# Patient Record
Sex: Female | Born: 1945
Health system: Southern US, Community
[De-identification: ages and names within clinical notes are randomized; demographics above are authoritative.]

## PROBLEM LIST (undated history)

## (undated) DIAGNOSIS — Z9119 Patient's noncompliance with other medical treatment and regimen: Secondary | ICD-10-CM

## (undated) DIAGNOSIS — E042 Nontoxic multinodular goiter: Secondary | ICD-10-CM

## (undated) DIAGNOSIS — I10 Essential (primary) hypertension: Secondary | ICD-10-CM

## (undated) DIAGNOSIS — K219 Gastro-esophageal reflux disease without esophagitis: Secondary | ICD-10-CM

## (undated) DIAGNOSIS — M1990 Unspecified osteoarthritis, unspecified site: Secondary | ICD-10-CM

## (undated) DIAGNOSIS — Z9289 Personal history of other medical treatment: Secondary | ICD-10-CM

## (undated) DIAGNOSIS — I2510 Atherosclerotic heart disease of native coronary artery without angina pectoris: Secondary | ICD-10-CM

## (undated) DIAGNOSIS — K449 Diaphragmatic hernia without obstruction or gangrene: Secondary | ICD-10-CM

## (undated) DIAGNOSIS — Z91199 Patient's noncompliance with other medical treatment and regimen due to unspecified reason: Secondary | ICD-10-CM

## (undated) DIAGNOSIS — I493 Ventricular premature depolarization: Secondary | ICD-10-CM

## (undated) DIAGNOSIS — R911 Solitary pulmonary nodule: Secondary | ICD-10-CM

## (undated) DIAGNOSIS — I6529 Occlusion and stenosis of unspecified carotid artery: Secondary | ICD-10-CM

## (undated) DIAGNOSIS — B1910 Unspecified viral hepatitis B without hepatic coma: Secondary | ICD-10-CM

## (undated) DIAGNOSIS — E785 Hyperlipidemia, unspecified: Secondary | ICD-10-CM

## (undated) DIAGNOSIS — I340 Nonrheumatic mitral (valve) insufficiency: Secondary | ICD-10-CM

## (undated) HISTORY — DX: Patient's noncompliance with other medical treatment and regimen: Z91.19

## (undated) HISTORY — DX: Occlusion and stenosis of unspecified carotid artery: I65.29

## (undated) HISTORY — PX: LIPOMA EXCISION: SHX5283

## (undated) HISTORY — DX: Ventricular premature depolarization: I49.3

## (undated) HISTORY — DX: Diaphragmatic hernia without obstruction or gangrene: K44.9

## (undated) HISTORY — PX: CORONARY ANGIOPLASTY WITH STENT PLACEMENT: SHX49

## (undated) HISTORY — DX: Solitary pulmonary nodule: R91.1

## (undated) HISTORY — PX: BIOPSY THYROID: PRO38

## (undated) HISTORY — DX: Unspecified osteoarthritis, unspecified site: M19.90

## (undated) HISTORY — DX: Gastro-esophageal reflux disease without esophagitis: K21.9

## (undated) HISTORY — DX: Nontoxic multinodular goiter: E04.2

## (undated) HISTORY — DX: Nonrheumatic mitral (valve) insufficiency: I34.0

## (undated) HISTORY — DX: Unspecified viral hepatitis B without hepatic coma: B19.10

## (undated) HISTORY — DX: Patient's noncompliance with other medical treatment and regimen due to unspecified reason: Z91.199

## (undated) HISTORY — DX: Hyperlipidemia, unspecified: E78.5

## (undated) HISTORY — DX: Personal history of other medical treatment: Z92.89

## (undated) HISTORY — PX: TONSILLECTOMY: SUR1361

## (undated) HISTORY — DX: Essential (primary) hypertension: I10

## (undated) HISTORY — DX: Atherosclerotic heart disease of native coronary artery without angina pectoris: I25.10

---

## 1997-06-13 HISTORY — PX: EXCISIONAL HEMORRHOIDECTOMY: SHX1541

## 1999-03-10 ENCOUNTER — Ambulatory Visit (HOSPITAL_COMMUNITY): Admission: RE | Admit: 1999-03-10 | Discharge: 1999-03-10 | Payer: Self-pay | Admitting: Gastroenterology

## 2003-08-15 ENCOUNTER — Encounter (INDEPENDENT_AMBULATORY_CARE_PROVIDER_SITE_OTHER): Payer: Self-pay | Admitting: Specialist

## 2003-08-15 ENCOUNTER — Ambulatory Visit (HOSPITAL_COMMUNITY): Admission: RE | Admit: 2003-08-15 | Discharge: 2003-08-15 | Payer: Self-pay | Admitting: Otolaryngology

## 2003-08-15 ENCOUNTER — Ambulatory Visit (HOSPITAL_BASED_OUTPATIENT_CLINIC_OR_DEPARTMENT_OTHER): Admission: RE | Admit: 2003-08-15 | Discharge: 2003-08-15 | Payer: Self-pay | Admitting: Otolaryngology

## 2007-05-13 ENCOUNTER — Emergency Department (HOSPITAL_COMMUNITY): Admission: EM | Admit: 2007-05-13 | Discharge: 2007-05-14 | Payer: Self-pay | Admitting: Emergency Medicine

## 2007-12-03 ENCOUNTER — Ambulatory Visit (HOSPITAL_COMMUNITY): Admission: RE | Admit: 2007-12-03 | Discharge: 2007-12-03 | Payer: Self-pay | Admitting: Gastroenterology

## 2007-12-08 ENCOUNTER — Encounter (INDEPENDENT_AMBULATORY_CARE_PROVIDER_SITE_OTHER): Payer: Self-pay | Admitting: *Deleted

## 2009-08-20 ENCOUNTER — Encounter (INDEPENDENT_AMBULATORY_CARE_PROVIDER_SITE_OTHER): Payer: Self-pay | Admitting: *Deleted

## 2009-08-24 ENCOUNTER — Ambulatory Visit: Payer: Self-pay | Admitting: Gastroenterology

## 2009-08-24 DIAGNOSIS — R198 Other specified symptoms and signs involving the digestive system and abdomen: Secondary | ICD-10-CM | POA: Insufficient documentation

## 2010-05-20 ENCOUNTER — Emergency Department (HOSPITAL_COMMUNITY): Admission: EM | Admit: 2010-05-20 | Discharge: 2009-09-21 | Payer: Self-pay | Admitting: Emergency Medicine

## 2010-07-15 NOTE — Letter (Signed)
Summary: New Patient letter  [facility]  [address] [place], [place] [address]   Phone: [phone]  Fax: [phone]       08/20/2009 MRN: [id number]  [facility] [address] [place], [place]  [address]  Dear Ms. [facility],  Welcome to the Gastroenterology Division at [facility].    You are scheduled to see Dr.  Claudette Head on August 24, 2009 at 9:30am on the 3rd floor at [facility], [address]  We ask that you try to arrive at our office 15 minutes prior to your appointment time to allow for check-in.  We would like you to complete the enclosed self-administered evaluation form prior to your visit and bring it with you on the day of your appointment.  We will review it with you.  Also, please bring a complete list of all your medications or, if you prefer, bring the medication bottles and we will list them.  Please bring your insurance card so that we may make a copy of it.  If your insurance requires a referral to see a specialist, please bring your referral form from your primary care physician.  Co-payments are due at the time of your visit and may be paid by cash, check or credit card.     Your office visit will consist of a consult with your physician (includes a physical exam), any laboratory testing he/she may order, scheduling of any necessary diagnostic testing (e.g. x-ray, ultrasound, CT-scan), and scheduling of a procedure (e.g. Endoscopy, Colonoscopy) if required.  Please allow enough time on your schedule to allow for any/all of these possibilities.    If you cannot keep your appointment, please call [phone] to cancel or reschedule prior to your appointment date.  This allows [place] the opportunity to schedule an appointment for another patient in need of care.  If you do not cancel or reschedule by 5 p.m. the business day prior to your appointment date, you will be charged a $50.00 late cancellation/no-show fee.    Thank you  for choosing [facility] for your medical needs.  We appreciate the opportunity to care for you.  Please visit [place] at our website  to learn more about our practice.                     Sincerely,                                                             The Gastroenterology Division

## 2010-07-15 NOTE — Op Note (Signed)
Summary: Esophageal 24-hour pH study  NAME:  [name], [name]           ACCOUNT NO.:  [id number]      MEDICAL RECORD NO.:  [id number]          PATIENT TYPE:  AMB      LOCATION:  ENDO                         FACILITY:  [facility]      PHYSICIAN:  John C. Madilyn Fireman, M.D.    DATE OF BIRTH:  1946-05-09      DATE OF PROCEDURE:   DATE OF DISCHARGE:  12/03/2007                                  OPERATIVE REPORT      OPERATION:  Esophageal 24-hour pH study.      INDICATIONS FOR PROCEDURE:  Reflux like dyspepsia.  This diagnosis not   strongly supported by endoscopic findings or response to antibiotic   medication.  Procedure is for better diagnostic accuracy and then   consideration of possible properness of antireflux surgery which she is   requesting.      RESULTS:  Total DeMeester score was 1.9, which was well below upper   limits of normal.  Symptom correlation was poor with 12 episodes of   reported belching, 1 episode of heartburn, and 1 episode cough with a 0%   correlation to documented reflux episodes.      IMPRESSION:  Normal reflux scores with assent symptom correlation.  This   study does not suggest reflux as a cause or gastroesophageal reflux as a   cause for his symptoms.      PLAN:  We will begin one more trial of double dose proton pump inhibitor   to see if her symptoms improve significantly before completely ruling   out real possible antireflux surgery.  She is instructed to follow-up   with me in 6 weeks.                  ______________________________   Everardo All Madilyn Fireman, M.D.            JCH/MEDQ  D:  12/08/2007  T:  12/09/2007  Job:  [id number]

## 2010-07-15 NOTE — Op Note (Signed)
Summary: Esophageal Motility Study  [name], PANIK           ACCOUNT NO.:  [id number]      MEDICAL RECORD NO.:  [id number]          PATIENT TYPE:  AMB      LOCATION:  ENDO                         FACILITY:  [facility]      PHYSICIAN:  John C. Madilyn Fireman, M.D.    DATE OF BIRTH:  1945-06-26      DATE OF PROCEDURE:   DATE OF DISCHARGE:  12/03/2007                                  OPERATIVE REPORT      ESOPHAGEAL MOTILITY STUDY      INDICATION FOR PROCEDURE:  Reflux like symptoms, not clearly confirmed   by endoscopy or response to medication.  Procedure is done in   conjunction with 24-hour pH study and in consideration of possible   appropriateness of antireflux surgery.      RESULTS:   1. Upper esophageal sphincter:  Normal.   2. Lower esophageal sphincter study:  Normal LES pressure at 43.5 and       normal relaxation at 86%.   3. Esophageal body study:  Ten out of eleven contractions peristaltic       with normal amplitude, duration, and velocity.      IMPRESSION:  Normal esophageal motility.                  ______________________________   Everardo All. Madilyn Fireman, M.D.            JCH/MEDQ  D:  12/08/2007  T:  12/08/2007  Job:  [id number]

## 2010-07-15 NOTE — Assessment & Plan Note (Signed)
Summary: frequent bowel movements, soreness..em   History of Present Illness Visit Type: Initial Visit Primary GI MD: Elie Goody MD [facility] Primary Provider: [facility] Chief Complaint: Patient compains of a change in bowels in the past week. She states that she is having 3 BM per day. She is having alot of rectal pain but denies any blood in her stool. She states that she has some upper abdominal pain and gas. Patient complains that she can not sleep and would like to know if Dr. Russella Dar can give her an Rx for Valium.  History of Present Illness:   This is a 65 year old female, who was previously evaluated by Dr. Dorena Cookey. By her report, she had problems with bad breath and belching and underwent evaluation including upper endoscopy, which apparently showed a small hiatal hernia. A pH study in 11/2007, which was negative and esophageal manometry in 11/2007 which was negative. She has had persistent problems with upper abdominal gas pains and belching. Her abdominal pain, generally improves with belching. She has noted a change in bowel habits over the past few months with more frequent stools occurring 2-3 times a day from her previous bowel pattern of one to 2 times a day. She occasionally has rectal pain with passage of a hard stool. She states she's been anxious. She had a flexible sigmoidoscopy in 1999 that was apparently unremarkable. In addition, she had an unknown type of rectal surgery performed by Dr. Johna Sheriff in 1999.    GI Review of Systems    Reports abdominal pain and  belching.     Location of  Abdominal pain: upper abdomen.    Denies acid reflux, bloating, chest pain, dysphagia with liquids, dysphagia with solids, heartburn, loss of appetite, nausea, vomiting, vomiting blood, weight loss, and  weight gain.      Reports change in bowel habits and  rectal pain.     Denies anal fissure, black tarry stools, constipation, diarrhea, diverticulosis, fecal incontinence, heme  positive stool, hemorrhoids, irritable bowel syndrome, jaundice, light color stool, liver problems, and  rectal bleeding. Preventive Screening-Counseling & Management  Alcohol-Tobacco     Smoking Status: current      Drug Use:  no.     Current Medications (verified): 1)  None  Allergies (verified): 1)  ! Ibuprofen (Ibuprofen)  Past History:  Past Medical History: GERD hiatal hernia  Past Surgical History: C section Tonsillectomy Rectal surgery ? hemorrhoidectomy, 1999  Family History: Family History of Diabetes: sister Family History of Prostate Cancer: father No FH of Colon Cancer:  Social History: Occupation: retired Patient currently smokes.  Alcohol Use - no Illicit Drug Use - no Smoking Status:  current Drug Use:  no  Review of Systems       The patient complains of back pain and sleeping problems.         The pertinent positives and negatives are noted as above and in the HPI. All other ROS were reviewed and were negative.   Vital Signs:  Patient profile:   65 year old female Height:      58 inches Weight:      113.4 pounds BMI:     23.79 Pulse rate:   84 / minute Pulse rhythm:   regular BP sitting:   160 / 82  (left arm) Cuff size:   regular  Vitals Entered By: Harlow Mares CMA Duncan Dull) (August 24, 2009 10:16 AM)  Physical Exam  General:  Well developed, well nourished, no acute distress.  Head:  Normocephalic and atraumatic. Eyes:  PERRLA, no icterus. Ears:  Normal auditory acuity. Mouth:  No deformity or lesions, dentition normal. Neck:  Supple; no masses or thyromegaly. Lungs:  Clear throughout to auscultation. Heart:  Regular rate and rhythm; no murmurs, rubs,  or bruits. Abdomen:  Soft, nontender and nondistended. No masses, hepatosplenomegaly or hernias noted. Normal bowel sounds. Rectal:  Normal exam. hemocult negative stool in vault.   Msk:  Symmetrical with no gross deformities. Normal posture. Pulses:  Normal pulses  noted. Extremities:  No clubbing, cyanosis, edema or deformities noted. Neurologic:  Alert and  oriented x4;  grossly normal neurologically. Cervical Nodes:  No significant cervical adenopathy. Inguinal Nodes:  No significant inguinal adenopathy. Psych:  Alert and cooperative. Normal mood and affect.  Impression & Recommendations:  Problem # 1:  CHANGE IN BOWELS (ICD-787.99) Mild change in bowel habits. Average risk for colorectal cancer. The risks, benefits and alternatives to colonoscopy with possible biopsy and possible polypectomy were discussed with the patient and they consent to proceed. The procedure will be scheduled electively if the patient decides to proceed. She was apparently reassured by the lack of abnormalities on physical examination inlcuding Hemoccult negative stool  today and was questioning whether colonoscopy would be useful. I tried to explain to her the benefits of colonoscopy to rule out other abnormalities and to screen for colon cancer and remove colon polyps if present. She states she would call back to schedule and if she would like to proceed Orders: Colonoscopy (Colon)  Problem # 2:  SCREENING COLORECTAL-CANCER (ICD-V76.51) See problem #1.  Patient Instructions: 1)  Colonoscopy brochure given.  2)  Pick up your prep from your pharmacy.  3)  Copy sent to : [facility] 4)  The medication list was reviewed and reconciled.  All changed / newly prescribed medications were explained.  A complete medication list was provided to the patient / caregiver.  Prescriptions: MOVIPREP 100 GM  SOLR (PEG-KCL-NACL-NASULF-NA ASC-C) As per prep instructions.  #1 x 0   Entered by:   Christie Nottingham CMA (AAMA)   Authorized by:   Meryl Dare MD [facility]   Signed by:   Christie Nottingham CMA (AAMA) on 08/24/2009   Method used:   Electronically to        [facility]  [id number] (ret[facility])       [address]       [place], [place]  [address]       Ph:  [phone] or [phone]       Fax: [phone]   RxID:   [id number]

## 2010-08-16 ENCOUNTER — Emergency Department (HOSPITAL_COMMUNITY): Payer: BC Managed Care – PPO

## 2010-08-16 ENCOUNTER — Observation Stay (HOSPITAL_COMMUNITY)
Admission: EM | Admit: 2010-08-16 | Discharge: 2010-08-17 | Disposition: A | Discharge status: Home / Self Care | Payer: BC Managed Care – PPO | Attending: Emergency Medicine | Admitting: Emergency Medicine

## 2010-08-16 DIAGNOSIS — I10 Essential (primary) hypertension: Secondary | ICD-10-CM | POA: Insufficient documentation

## 2010-08-16 DIAGNOSIS — R079 Chest pain, unspecified: Principal | ICD-10-CM | POA: Insufficient documentation

## 2010-08-16 DIAGNOSIS — R0789 Other chest pain: Secondary | ICD-10-CM

## 2010-08-16 DIAGNOSIS — E785 Hyperlipidemia, unspecified: Secondary | ICD-10-CM | POA: Insufficient documentation

## 2010-08-16 LAB — CBC
HCT: 38 % (ref 36.0–46.0)
Hemoglobin: 12.7 g/dL (ref 12.0–15.0)
MCHC: 33.4 g/dL (ref 30.0–36.0)
Platelets: 382 10*3/uL (ref 150–400)
RBC: 4.12 MIL/uL (ref 3.87–5.11)

## 2010-08-16 LAB — COMPREHENSIVE METABOLIC PANEL
ALT: 24 U/L (ref 0–35)
AST: 26 U/L (ref 0–37)
Albumin: 4.1 g/dL (ref 3.5–5.2)
BUN: 11 mg/dL (ref 6–23)
CO2: 22 mEq/L (ref 19–32)
Calcium: 8.6 mg/dL (ref 8.4–10.5)
Chloride: 108 mEq/L (ref 96–112)
Creatinine, Ser: 0.72 mg/dL (ref 0.4–1.2)
GFR calc Af Amer: >60 mL/min (ref >60)
GFR calc non Af Amer: >60 mL/min (ref >60)
Potassium: 3.5 mEq/L (ref 3.5–5.1)
Sodium: 139 mEq/L (ref 135–145)
Total Protein: 6.7 g/dL (ref 6.0–8.3)

## 2010-08-16 LAB — CK TOTAL AND CKMB (NOT AT ARMC): CK, MB: 1.3 ng/mL (ref 0.3–4.0)

## 2010-08-16 LAB — POCT CARDIAC MARKERS
Myoglobin, poc: 50.4 ng/mL (ref 12–200)
Myoglobin, poc: 47.8 ng/mL (ref 12–200)
Troponin i, poc: <0.05 ng/mL (ref 0.00–0.09)

## 2010-08-17 LAB — CARDIAC PANEL(CRET KIN+CKTOT+MB+TROPI)
CK, MB: 1.2 ng/mL (ref 0.3–4.0)
CK, MB: 1.3 ng/mL (ref 0.3–4.0)
Relative Index: 1 (ref 0.0–2.5)
Relative Index: 1.2 (ref 0.0–2.5)
Total CK: 113 U/L (ref 7–177)
Troponin I: <0.01 ng/mL (ref 0.00–0.06)
Troponin I: <0.01 ng/mL (ref 0.00–0.06)

## 2010-08-17 LAB — LIPID PANEL
HDL: 46 mg/dL (ref >39)
Total CHOL/HDL Ratio: 4.9 RATIO
Triglycerides: 183 mg/dL — ABNORMAL HIGH (ref <150)
VLDL: 37 mg/dL (ref 0–40)

## 2010-08-17 LAB — BASIC METABOLIC PANEL
BUN: 8 mg/dL (ref 6–23)
CO2: 23 mEq/L (ref 19–32)
Calcium: 8.8 mg/dL (ref 8.4–10.5)
Chloride: 110 mEq/L (ref 96–112)
Creatinine, Ser: 0.74 mg/dL (ref 0.4–1.2)
GFR calc non Af Amer: >60 mL/min (ref >60)
Glucose, Bld: 102 mg/dL — ABNORMAL HIGH (ref 70–99)
Sodium: 140 mEq/L (ref 135–145)

## 2010-08-17 LAB — CBC
MCHC: 32.8 g/dL (ref 30.0–36.0)
MCV: 92 fL (ref 78.0–100.0)
RBC: 4.11 MIL/uL (ref 3.87–5.11)

## 2010-08-24 NOTE — H&P (Signed)
NAMECOURTNEY, BELLIZZI NO.:  [id number]  MEDICAL RECORD NO.:  [id number]           PATIENT TYPE:  I  LOCATION:  3737                         FACILITY:  [facility]  PHYSICIAN:  Nestor Ramp, MD        DATE OF BIRTH:  Nov 18, 1945  DATE OF ADMISSION:  08/16/2010 DATE OF DISCHARGE:                             HISTORY & PHYSICAL   PRIMARY CARE PROVIDER:  Dr. Alwyn Ren at [facility].  CHIEF COMPLAINT:  Chest pain.  HISTORY OF PRESENT ILLNESS:  The patient states that this morning she felt her heart beating fast and she felt some chest pain.  She says she felt like her chest was tight.  She says she took her blood pressure and it was normal.  She states that she sometimes had chest pain in the past but it always happened when her blood pressure is high and then she takes her blood pressure medication and the pain goes away.  Today, her blood pressure was not high, so she went to the urgent care.  She deniesany exertional chest pain, denies diaphoresis or dyspnea.  She says that the urgent care sent her to the emergency department.  She states that she has had intermittent chest pain for a long time in the past, but she has refused a Cardiology referral and treadmill test because she says that she does not want to do a treadmill test she does not want to stress her heart.  ALLERGIES:  None.  MEDICATION:  Atenolol 25 mg p.o. daily.  PAST MEDICAL HISTORY:  Hypertension.  PAST SURGICAL HISTORY:  Negative.  SOCIAL HISTORY:  The patient denies tobacco, alcohol, or drug use.  FAMILY HISTORY:  Positive for mother with heart problems, otherwise noncontributory.  REVIEW OF SYSTEMS:  Negative for fevers.  Negative for rhinorrhea. Positive for chest pain and palpitations.  Negative  for dyspnea. Negative for nausea, vomiting.  Negative for arthralgias.  Negative for dizziness.  Negative for visual changes.  PHYSICAL EXAMINATION:  VITAL SIGNS:  Temperature 98.4, pulse  72, respiratory rate 16, blood pressure 124/66, pulse ox 100% on room air. GENERAL:  In no acute distress. HEAD, EYES, EARS, NOSE, AND THROAT:  Extraocular movements intact. Pupils equal, round, and reactive to light. NECK:  Supple.  No JVD. CARDIOVASCULAR:  Regular rate and rhythm.  No murmurs, rubs, or gallops. LUNGS:  Clear to auscultation bilaterally. ABDOMEN:  Positive bowel sounds.  Soft and nontender, nondistended. EXTREMITIES:  A 2+ pulses.  No edema. NEUROLOGIC:  Alert and oriented x3.  No focal deficits.  LABORATORY DATA AND STUDIES:  CBC:  White blood cell count 6.8, hemoglobin 12.7, platelets 382.  Metabolic panel:  Sodium 139, potassium 3.5, chloride 108, bicarb 22, BUN 11, creatinine 0.72, glucose 130, total bilirubin 0.5, alk phos 48, AST 26, ALT 24, total protein 6.7, albumin 4.1, calcium 8.6.  Point-of-care cardiac enzymes, CK-MB less than 1.0, troponin I less than 0.05, myoglobin is 50.4. EKG normal sinus rhythm, unchanged from previous.  ASSESSMENT/PLAN:  This is a 65 year old female with past medical history of hypertension who presents with chest pain. 1. Chest pain.  Pain has  resolved now.  The patient reportedly has     refused a Cardiology referral for a stress test in the past.  We     will admit the patient to telemetry, cycle cardiac enzymes, risk     stratify for acute coronary syndrome with a hemoglobin A1c, TSH,     and fasting lipid panel.  We will get an EKG in the morning.  We     will likely encourage the patient to have a stress test done as an     outpatient, and we will start her on aspirin 81 mg p.o. daily. 2. Hypertension.  We will give her home atenolol and write hold     parameters. 3. Fluids, electrolytes, and nutrition/gastrointestinal.  We will give     a heart healthy diet and hep-lock IV. 4. Deep venous thrombosis prophylaxis.  Heparin 5000 units subcu     t.i.d. 5. Disposition is pending clinical  improvement.    ______________________________ Ardyth Gal, MD   ______________________________ Nestor Ramp, MD    CR/MEDQ  D:  08/16/2010  T:  08/17/2010  Job:  [id number]  Electronically Signed by Ardyth Gal MD on 08/22/2010 12:26:39 PM Electronically Signed by Denny Levy MD on 08/24/2010 08:56:44 AM

## 2010-09-03 ENCOUNTER — Encounter: Payer: Self-pay | Admitting: Cardiology

## 2010-09-07 NOTE — Discharge Summary (Signed)
NAMEJILLYN, [name] NO.:  [id number]  MEDICAL RECORD NO.:  [id number]           PATIENT TYPE:  I  LOCATION:  3737                         FACILITY:  [facility]  PHYSICIAN:  Nestor Ramp, MD        DATE OF BIRTH:  03/16/1946  DATE OF ADMISSION:  08/16/2010 DATE OF DISCHARGE:  08/17/2010                              DISCHARGE SUMMARY   DISCHARGE DIAGNOSES: 1. Chest pain. 2. Hypertension. 3. Hyperlipidemia.  DISCHARGE MEDICATIONS: 1. Aspirin 81 mg p.o. daily. 2. Nitroglycerin 0.4 mg 1 tablet for chest pain, may repeat every 5     minutes up to a total of 3 doses in 15 minutes until for chest pain     is relieved.  If chest pain does not relieved after three doses,     call EMS or go to a hospital. 3. Atenolol 25 mg 1 tablet p.o. daily.  PROCEDURES:  She had a chest x-ray on August 17 1010, with impression negative exam.  She had an EKG on August 16, 2010, impression normal sinus rhythm, nonspecific T-wave abnormality.  No significant change from previous.  She had a repeat EKG on August 17, 2010, impression normal sinus rhythm, T-wave abnormality, Q-wave abnormality slightly more prominent since previous.  LABORATORY DATA:  At time of admission, sodium 139, potassium 3.5, chloride 108, bicarb of 22, BUN of 11, creatinine of 0.72, glucose of 130.  AST 26, ALT 24, calcium 8.6, total bili of 0.5 alk phos of 48. CBC with WBCs of 6.8, hemoglobin of 12.7, hematocrit of 38, and platelets of 382,000.  Point-of-care cardiac markers were negative. Troponin peak was 0.802.  Hemoglobin A1c was 6.0.  Lipid profile showed total cholesterol 125, triglycerides 183, HDL 46, LDL 142.  TSH was 1.83.  At time of discharge, sodium was 140, potassium 3.4, chloride 110, bicarb 23, BUN of 8, creatinine of 0.74, glucose of 102.  BRIEF HOSPITAL COURSE:  This is a 65 year old female with history of hypertension who presented with chest pain. 1. Chest pain.  The patient was admitted to the  hospital and ruled out     for MI with cardiac enzymes negative x3.  EKG showed some     nonspecific T-wave abnormalities but no evidence of ischemia or     infarction.  She was risk stratified during the admission with     hemoglobin A1c, TSH, and fasting lipid panel.  These were normal     but her lipid panel which was shown to have an elevated LDL.  The     patient has refused stress test in the past when she has had chest     pain.  However, she did agree to go see a cardiologist and consider     getting a stress test as an outpatient.  During admission, she was     started on a daily baby aspirin. 2. Hypertension.  She was continued on her home atenolol during the     admission.  The patient is quite noncompliant with this medication     at home, takes very intermittently.  Stressed to her that her  compliance with this medication is important in controlling her     blood pressure and preventing heart attack in the future. 3. Hyperlipidemia.  The patient with LDL of 142, total cholesterol of     225.  Recommended to the patient that she start on a statin;     however, the patient refused to go on statin at this time.     Recommend further pursuing this at her outpatient appointment.  DISCHARGE INSTRUCTIONS:  The patient was discharged with no restrictions in activity.  She is to follow low-sodium, heart-healthy diet.  She is to avoid any straining and stopping activity that causes chest pain, shortness of breath, dizziness, sweating, or excessive weakness.  FOLLOWUP APPOINTMENTS:  She is to follow up at [facility] with Dr. Alwyn Ren.  She was instructed to call and make an appointment within the next 2 weeks to follow up with Dr. Alwyn Ren.  She has an appointment to follow up with Dr. Shirlee Latch in Cardiology for initial evaluation and possible stress test.  That appointment is set for September 15, 2010, at 11:30 a.m.  She was given the number if she needs to change or cancel  this appointment.  FOLLOWUP ISSUES/RECOMMENDATIONS:  Recommend further possibly getting this patient on a statin due to her high cholesterol.  Recommend consideration of outpatient treadmill test due to the fact that she has hypertension and continues to have intermittent chest pain.  DISCHARGE CONDITION:  The patient was discharged home in stable medical condition.    ______________________________ [name], MD   ______________________________ Nestor Ramp, MD    CM/MEDQ  D:  08/19/2010  T:  08/20/2010  Job:  [id number]  cc:   Peyton Najjar, MD Marca Ancona, MD  Electronically Signed by [name] MD on 08/31/2010 02:32:26 PM Electronically Signed by Denny Levy MD on 09/07/2010 04:35:05 PM

## 2010-09-15 ENCOUNTER — Encounter: Payer: Self-pay | Admitting: Cardiology

## 2010-09-15 ENCOUNTER — Ambulatory Visit (INDEPENDENT_AMBULATORY_CARE_PROVIDER_SITE_OTHER): Payer: BC Managed Care – PPO | Admitting: Cardiology

## 2010-09-15 VITALS — BP 122/70 | HR 68 | Ht <= 58 in | Wt 104.8 lb

## 2010-09-15 DIAGNOSIS — I10 Essential (primary) hypertension: Secondary | ICD-10-CM

## 2010-09-15 DIAGNOSIS — R079 Chest pain, unspecified: Secondary | ICD-10-CM

## 2010-09-15 DIAGNOSIS — E785 Hyperlipidemia, unspecified: Secondary | ICD-10-CM

## 2010-09-15 NOTE — Assessment & Plan Note (Signed)
LDL is elevated at 142.  She does not want to take a statin.

## 2010-09-15 NOTE — Assessment & Plan Note (Signed)
BP appears to be under control on atenolol.

## 2010-09-15 NOTE — Progress Notes (Signed)
PCP: Dr. Janace Hoard  65 yo with history of HTN and hyperlipidemia presents for evaluation of chest pain.  Patient has had a history of atypical chest pain but has refused outpatient stress testing in the past.  She was admitted to [facility] in 3/12 with central chest tightness unrelated to exertion.  Cardiac enzymes were negative x 3 but she had shallow anterior T wave inversions on ECG.  She appears to have refused inpatient stress testing so she was referred for cardiology evaluation.  Today, her ECG is normal (clear change from ECG in the hospital).  She is a bit vague with history.  She still gets periodic episodes of lower substernal chest pressure that is not clearly related to exertion.  She says that her chest hurts when her "blood pressure goes up."  Her heart also pounds when she gets chest pressure.  She has been having these episodes since 2008 but seems to have refused evaluation by stress testing whenever it is brought up.  She is taking atenolol and BP is 122/70 today.  It was recommended that she take aspirin 81 mg daily and a statin due to elevated LDL when she was in the hospital, but she refused.   ECG (3/12): NSR, shallow anterior T wave inversions. ECG (today): NSR, normal  Labs (3/12): TSH normal, LDL 142, HDl 46, K 3.4, creatinine 9.56  PMH: 1. HTN 2. Hyperlipidemia 3. Atypical chest pain 4. Medical noncompliance  SH: Nonsmoker.  Married, lives in [place].  FH: Father with cancer.   ROS: All systems reviewed and negative except as per HPI.   Current Outpatient Prescriptions  Medication Sig Dispense Refill  . atenolol (TENORMIN) 25 MG tablet Take one tablet every other day      . NITROSTAT 0.4 MG SL tablet As needed for chest pain every 5 minutes up to 3 times        BP 122/70  Pulse 68  Ht 4\' 9"  (1.448 m)  Wt 104 lb 12.8 oz (47.537 kg)  BMI 22.68 kg/m2 General: NAD Neck: No JVD, no thyromegaly or thyroid nodule.  Lungs: Clear to auscultation bilaterally  with normal respiratory effort. CV: Nondisplaced PMI.  Heart regular S1/S2, no S3/S4, no murmur.  No peripheral edema.  No carotid bruit.  Normal pedal pulses.  Abdomen: Soft, nontender, no hepatosplenomegaly, no distention.  Skin: Intact without lesions or rashes.  Neurologic: Alert and oriented x 3.  Psych: Normal affect. Extremities: No clubbing or cyanosis.  HEENT: Normal.

## 2010-09-15 NOTE — Assessment & Plan Note (Signed)
Patient has had atypical chest pain since 2008.  She has refused outpatient evaluation in the past.  She was recently hospitalized overnight in 3/12 and ruled out for MI.  It appears that she refused stress testing then.  She does appear to have had some dynamic anterior T wave changes.  Risk factors for CAD are HTN and hyperlipidemia.  Especially given the ECG changes, I think that it would be a good idea for her to undergo ETT-myoview.  However, she refuses to undergo stress testing at this time. She will think about it and call us back if she decides to do it.   I recommended that she take aspirin 81 mg daily but she does not want to do this.  I will set her up for an echocardiogram, which she is willing to do.

## 2010-09-15 NOTE — Patient Instructions (Signed)
Schedule an appointment for an echocardiogram.  Schedule an appointment to see Dr Shirlee Latch in 2-3 weeks.

## 2010-09-23 ENCOUNTER — Other Ambulatory Visit (HOSPITAL_COMMUNITY): Payer: BC Managed Care – PPO | Admitting: Radiology

## 2010-09-30 ENCOUNTER — Ambulatory Visit (HOSPITAL_COMMUNITY): Payer: BC Managed Care – PPO | Attending: Cardiology

## 2010-09-30 DIAGNOSIS — R079 Chest pain, unspecified: Secondary | ICD-10-CM

## 2010-09-30 DIAGNOSIS — I079 Rheumatic tricuspid valve disease, unspecified: Secondary | ICD-10-CM | POA: Insufficient documentation

## 2010-09-30 DIAGNOSIS — R072 Precordial pain: Secondary | ICD-10-CM | POA: Insufficient documentation

## 2010-09-30 DIAGNOSIS — I10 Essential (primary) hypertension: Secondary | ICD-10-CM | POA: Insufficient documentation

## 2010-09-30 DIAGNOSIS — I379 Nonrheumatic pulmonary valve disorder, unspecified: Secondary | ICD-10-CM | POA: Insufficient documentation

## 2010-09-30 DIAGNOSIS — I059 Rheumatic mitral valve disease, unspecified: Secondary | ICD-10-CM | POA: Insufficient documentation

## 2010-10-07 ENCOUNTER — Ambulatory Visit: Payer: BC Managed Care – PPO | Admitting: Cardiology

## 2010-10-14 ENCOUNTER — Emergency Department (HOSPITAL_COMMUNITY): Payer: BC Managed Care – PPO

## 2010-10-14 ENCOUNTER — Observation Stay (HOSPITAL_COMMUNITY)
Admission: EM | Admit: 2010-10-14 | Discharge: 2010-10-16 | Disposition: A | Discharge status: Home / Self Care | Payer: BC Managed Care – PPO | Attending: Family Medicine | Admitting: Family Medicine

## 2010-10-14 DIAGNOSIS — I10 Essential (primary) hypertension: Secondary | ICD-10-CM

## 2010-10-14 DIAGNOSIS — R079 Chest pain, unspecified: Principal | ICD-10-CM | POA: Insufficient documentation

## 2010-10-14 DIAGNOSIS — R002 Palpitations: Secondary | ICD-10-CM | POA: Insufficient documentation

## 2010-10-14 DIAGNOSIS — E785 Hyperlipidemia, unspecified: Secondary | ICD-10-CM | POA: Insufficient documentation

## 2010-10-14 DIAGNOSIS — R0602 Shortness of breath: Secondary | ICD-10-CM | POA: Insufficient documentation

## 2010-10-14 DIAGNOSIS — R0789 Other chest pain: Secondary | ICD-10-CM

## 2010-10-14 LAB — DIFFERENTIAL
Basophils Relative: 1 % (ref 0–1)
Lymphocytes Relative: 26 % (ref 12–46)
Lymphs Abs: 1.9 10*3/uL (ref 0.7–4.0)
Monocytes Absolute: 0.5 10*3/uL (ref 0.1–1.0)
Monocytes Relative: 7 % (ref 3–12)
Neutro Abs: 4.8 10*3/uL (ref 1.7–7.7)
Neutrophils Relative %: 66 % (ref 43–77)

## 2010-10-14 LAB — COMPREHENSIVE METABOLIC PANEL
Albumin: 4 g/dL (ref 3.5–5.2)
Alkaline Phosphatase: 49 U/L (ref 39–117)
BUN: 15 mg/dL (ref 6–23)
CO2: 27 mEq/L (ref 19–32)
Chloride: 105 mEq/L (ref 96–112)
GFR calc Af Amer: >60 mL/min (ref >60)
GFR calc non Af Amer: >60 mL/min (ref >60)
Glucose, Bld: 98 mg/dL (ref 70–99)
Sodium: 139 mEq/L (ref 135–145)
Total Protein: 6.9 g/dL (ref 6.0–8.3)

## 2010-10-14 LAB — URINALYSIS, ROUTINE W REFLEX MICROSCOPIC
Bilirubin Urine: NEGATIVE
Glucose, UA: NEGATIVE mg/dL
Nitrite: NEGATIVE
Specific Gravity, Urine: 1.009 (ref 1.005–1.030)
Urobilinogen, UA: 0.2 mg/dL (ref 0.0–1.0)

## 2010-10-14 LAB — POCT CARDIAC MARKERS
CKMB, poc: <1 ng/mL — ABNORMAL LOW (ref 1.0–8.0)
Myoglobin, poc: 60.6 ng/mL (ref 12–200)
Troponin i, poc: <0.05 ng/mL (ref 0.00–0.09)

## 2010-10-14 LAB — CBC: RBC: 4.1 MIL/uL (ref 3.87–5.11)

## 2010-10-15 DIAGNOSIS — R079 Chest pain, unspecified: Secondary | ICD-10-CM

## 2010-10-15 LAB — BASIC METABOLIC PANEL
BUN: 12 mg/dL (ref 6–23)
Chloride: 104 mEq/L (ref 96–112)
GFR calc Af Amer: >60 mL/min (ref >60)
GFR calc non Af Amer: >60 mL/min (ref >60)
Potassium: 3.4 mEq/L — ABNORMAL LOW (ref 3.5–5.1)
Sodium: 138 mEq/L (ref 135–145)

## 2010-10-15 LAB — CARDIAC PANEL(CRET KIN+CKTOT+MB+TROPI)
CK, MB: 2.2 ng/mL (ref 0.3–4.0)
CK, MB: 2.2 ng/mL (ref 0.3–4.0)
Relative Index: 1.6 (ref 0.0–2.5)
Relative Index: 1.6 (ref 0.0–2.5)
Total CK: 141 U/L (ref 7–177)
Total CK: 129 U/L (ref 7–177)
Troponin I: <0.3 ng/mL (ref <0.30)

## 2010-10-15 LAB — POCT CARDIAC MARKERS: CKMB, poc: <1 ng/mL — ABNORMAL LOW (ref 1.0–8.0)

## 2010-10-15 LAB — CBC
HCT: 37.6 % (ref 36.0–46.0)
MCH: 30.1 pg (ref 26.0–34.0)
Platelets: 369 10*3/uL (ref 150–400)
RDW: 12 % (ref 11.5–15.5)

## 2010-10-15 LAB — URINE CULTURE: Culture  Setup Time: 201205032230

## 2010-10-16 ENCOUNTER — Inpatient Hospital Stay (HOSPITAL_COMMUNITY): Payer: BC Managed Care – PPO

## 2010-10-16 LAB — BASIC METABOLIC PANEL
CO2: 26 mEq/L (ref 19–32)
Calcium: 9.3 mg/dL (ref 8.4–10.5)
Creatinine, Ser: 0.8 mg/dL (ref 0.4–1.2)
GFR calc Af Amer: >60 mL/min (ref >60)
Glucose, Bld: 85 mg/dL (ref 70–99)

## 2010-10-16 LAB — CBC
Hemoglobin: 12.7 g/dL (ref 12.0–15.0)
MCH: 30.2 pg (ref 26.0–34.0)
MCHC: 32.4 g/dL (ref 30.0–36.0)
RDW: 12.1 % (ref 11.5–15.5)

## 2010-10-16 MED ORDER — TECHNETIUM TC 99M TETROFOSMIN IV KIT
10.0000 | PACK | Freq: Once | INTRAVENOUS | Status: AC | PRN
  Administered 2010-10-16: 10 via INTRAVENOUS

## 2010-10-16 MED ORDER — TECHNETIUM TC 99M TETROFOSMIN IV KIT
30.0000 | PACK | Freq: Once | INTRAVENOUS | Status: AC | PRN
  Administered 2010-10-16: 30 via INTRAVENOUS

## 2010-10-17 ENCOUNTER — Other Ambulatory Visit (HOSPITAL_COMMUNITY): Payer: BC Managed Care – PPO

## 2010-10-17 ENCOUNTER — Emergency Department (HOSPITAL_COMMUNITY)
Admission: EM | Admit: 2010-10-17 | Discharge: 2010-10-18 | Disposition: A | Discharge status: Home / Self Care | Payer: BC Managed Care – PPO | Attending: Emergency Medicine | Admitting: Emergency Medicine

## 2010-10-17 DIAGNOSIS — F411 Generalized anxiety disorder: Secondary | ICD-10-CM | POA: Insufficient documentation

## 2010-10-17 DIAGNOSIS — K219 Gastro-esophageal reflux disease without esophagitis: Secondary | ICD-10-CM | POA: Insufficient documentation

## 2010-10-17 DIAGNOSIS — R079 Chest pain, unspecified: Secondary | ICD-10-CM | POA: Insufficient documentation

## 2010-10-17 DIAGNOSIS — I10 Essential (primary) hypertension: Secondary | ICD-10-CM | POA: Insufficient documentation

## 2010-10-17 LAB — BASIC METABOLIC PANEL
CO2: 24 mEq/L (ref 19–32)
Glucose, Bld: 109 mg/dL — ABNORMAL HIGH (ref 70–99)
Potassium: 3.9 mEq/L (ref 3.5–5.1)
Sodium: 135 mEq/L (ref 135–145)

## 2010-10-25 NOTE — Consult Note (Signed)
NAMEARLENIS, BLAYDES NO.:  [id number]  MEDICAL RECORD NO.:  [id number]           PATIENT TYPE:  E  LOCATION:  [facility]                         FACILITY:  [facility]  PHYSICIAN:  Mercedez Boule M. [place], M.D.  DATE OF BIRTH:  26-Jul-1945  DATE OF CONSULTATION:  10/15/2010 DATE OF DISCHARGE:                                CONSULTATION   HISTORY OF PRESENT ILLNESS:  Mrs. Parisi is a 65 year old Asian female who is seen for evaluation of recurrent chest pain at the request of Dr. McDiarmid.  She was admitted last night after experiencing onset of chest pain at 8:00 p.m. associated with sensation that her heart was pounding and described as a mid substernal chest soreness.  She also had associated tightness.  There was no radiation.  She did complain of some shortness of breath.  Her cardiac risk factors include a history of hypertension and hyperlipidemia.  The patient was also hospitalized in March of this year with similar chest pain.  She refused stress testing at that time.  She did rule out for myocardial infarction but had anterior T-wave inversion.  She did see Dr. Shirlee Latch on September 15, 2010, for a cardiac evaluation and again refused stress testing.  She did have an echocardiogram which was normal.  PAST MEDICAL HISTORY: 1. Hypertension. 2. Hyperlipidemia. 3. Medical noncompliance.  She has had no prior surgeries.  She has no known allergies.  CURRENT MEDICATIONS: 1. Aspirin 81 mg daily. 2. Metoprolol 12.5 mg daily. 3. Protonix 40 mg daily.  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  She denies tobacco or alcohol use.  FAMILY HISTORY:  Positive for history of coronary artery disease with her mother having heart problems.  REVIEW OF SYSTEMS:  She has no history of TIA or stroke.  She has no edema or orthopnea.  She has no known history of arrhythmia.  All other systems are reviewed and are negative.  PHYSICAL EXAMINATION:   GENERAL:  She is a pleasant, slender Asian female in no acute distress. VITAL SIGNS:  Blood pressure is 142/76, pulse 64 and regular, she is afebrile, and saturations were 97% on room air. HEENT:  She is normocephalic and atraumatic.  Her pupils are equal, round, and reactive to light and accommodation.  Sclerae are clear. Oropharynx is clear. NECK:  Supple without JVD, adenopathy, thyromegaly, or bruits. LUNGS:  Clear. CARDIAC:  Regular rate and rhythm without gallop, murmur, or click. ABDOMEN:  Soft and nontender.  Bowel sounds are positive. EXTREMITIES:  Without edema.  Pulses 2+ and symmetric. NEUROLOGIC:  She is alert and oriented x3.  Cranial nerves II through XII are intact.  She has nonfocal exam.  LABORATORY DATA:  Chest x-ray shows no active disease.  Cardiac enzymes are negative x3.  Potassium 3.4.  Otherwise, basic metabolic panel was normal with creatinine of 0.71.  CBC is normal.  IMPRESSION: 1. Chest pain.  The patient has refused ischemic evaluation in the     past.  She does have cardiac risk factors of hypertension and     hyperlipidemia.  She does have T-wave inversions  on her ECG. 2. Hypertension. 3. Hyperlipidemia.  The patient has refused statin therapy in the     past.  PLAN:  Again, recommended ischemic evaluation.  I discussed in detail the potential imaging modalities including nuclear stress testing, cardiac CT angiography, and cardiac catheterization.  It would be my recommendation that she have a nuclear stress test.  She does appear to be agreeable at this time and we will plan on scheduling her for this test while she is an inpatient tomorrow morning.  If abnormal, I told her she would need a cardiac catheterization.  I would recommend continue her on aspirin and beta-blocker.  I would encourage her to take a statin drug for her elevated lipids.          ______________________________ Zachrey Deutscher M. [place], M.D.     PMJ/MEDQ  D:  10/15/2010  T:   10/16/2010  Job:  [id number]  cc:   Leighton Roach McDiarmid, M.D. Peyton Najjar, MD Marca Ancona, MD  Electronically Signed by Mysti Haley [place]and M.D. on 10/25/2010 09:37:53 AM

## 2010-10-29 NOTE — Op Note (Signed)
NAMEJOHNICA, ARMWOOD           ACCOUNT NO.:  [id number]   MEDICAL RECORD NO.:  [id number]          PATIENT TYPE:  AMB   LOCATION:  ENDO                         FACILITY:  [facility]   PHYSICIAN:  John C. Madilyn Fireman, M.D.    DATE OF BIRTH:  04-26-1946   DATE OF PROCEDURE:  DATE OF DISCHARGE:  12/03/2007                               OPERATIVE REPORT   ESOPHAGEAL MOTILITY STUDY   INDICATION FOR PROCEDURE:  Reflux like symptoms, not clearly confirmed  by endoscopy or response to medication.  Procedure is done in  conjunction with 24-hour pH study and in consideration of possible  appropriateness of antireflux surgery.   RESULTS:  1. Upper esophageal sphincter:  Normal.  2. Lower esophageal sphincter study:  Normal LES pressure at 43.5 and      normal relaxation at 86%.  3. Esophageal body study:  Ten out of eleven contractions peristaltic      with normal amplitude, duration, and velocity.   IMPRESSION:  Normal esophageal motility.           ______________________________  Everardo All. Madilyn Fireman, M.D.     JCH/MEDQ  D:  12/08/2007  T:  12/08/2007  Job:  [id number]

## 2010-10-29 NOTE — Op Note (Signed)
[name], LANGONE                     ACCOUNT NO.:  [id number]   MEDICAL RECORD NO.:  [id number]                   PATIENT TYPE:  OUT   LOCATION:  [facility]                                 FACILITY:  [facility]   PHYSICIAN:  Lucky Cowboy, M.D.                    DATE OF BIRTH:  [birth year]   DATE OF PROCEDURE:  08/15/2003  DATE OF DISCHARGE:  08/15/2003                                 OPERATIVE REPORT   PREOPERATIVE DIAGNOSIS:  Chronic tonsillitis with halitosis.   POSTOPERATIVE DIAGNOSIS:  Chronic tonsillitis with halitosis.   PROCEDURE:  Tonsillectomy.   SURGEON:  Lucky Cowboy, M.D.   ANESTHESIA:  General endotracheal.   ESTIMATED BLOOD LOSS:  None.   COMPLICATIONS:  None.   INDICATIONS:  The patient is a 65 year old female with a four year history  of halitosis.  She has tried irrigations without improvement.  She is  getting necrotic particles and chronic pain in her tonsils.  For these  reasons, tonsillectomy is performed.   FINDINGS:  The patient was noted to have multiple areas of retained debris  in the tonsillar crypts.   PROCEDURE:  The patient was taken to the operating room and placed on the  table in the supine position.  She was then placed under general  endotracheal anesthesia and the table rotated counterclockwise 90 degrees.  The neck was gently extended using the shoulder roll.  The head and body  were draped in the usual fashion.  A Crowe-Davis mouth gag with a #3 tongue  blade was then placed intraorally, opened and suspended on the Mayo stand.  Allis clamp was used to grasp the right palantine tonsil, which was directed  inferomedially.  The Harmonic scalpel was used to excise the tonsil and the  peritonsillar space adjacent to the tonsillar capsule.  The left palantine  tonsil was removed in an identical fashion.  An NG tube was placed down the  esophagus, for suctioning of the gastric content.  The mouth gag was removed, noting no damage to the teeth or  soft tissues.  The table was rotated clockwise 90 degrees of its original position.  The  patient was awakened from anesthesia and extubated in the operating room.  He was taken to the postanesthesia care unit in stable condition without  complications.                                               Lucky Cowboy, M.D.    SJ/MEDQ  D:  09/06/2003  T:  09/07/2003  Job:  [id number]   cc:   [facility]   Stan Head. Cleta Alberts, M.D.  [address]  [place]  [place] [address]  Fax: [phone]

## 2010-10-29 NOTE — Op Note (Signed)
[name], [name]           ACCOUNT NO.:  [id number]   MEDICAL RECORD NO.:  [id number]          PATIENT TYPE:  AMB   LOCATION:  ENDO                         FACILITY:  [facility]   PHYSICIAN:  John C. Madilyn Fireman, M.D.    DATE OF BIRTH:  30-Apr-1946   DATE OF PROCEDURE:  DATE OF DISCHARGE:  12/03/2007                               OPERATIVE REPORT   OPERATION:  Esophageal 24-hour pH study.   INDICATIONS FOR PROCEDURE:  Reflux like dyspepsia.  This diagnosis not  strongly supported by endoscopic findings or response to antibiotic  medication.  Procedure is for better diagnostic accuracy and then  consideration of possible properness of antireflux surgery which she is  requesting.   RESULTS:  Total DeMeester score was 1.9, which was well below upper  limits of normal.  Symptom correlation was poor with 12 episodes of  reported belching, 1 episode of heartburn, and 1 episode cough with a 0%  correlation to documented reflux episodes.   IMPRESSION:  Normal reflux scores with assent symptom correlation.  This  study does not suggest reflux as a cause or gastroesophageal reflux as a  cause for his symptoms.   PLAN:  We will begin one more trial of double dose proton pump inhibitor  to see if her symptoms improve significantly before completely ruling  out real possible antireflux surgery.  She is instructed to follow-up  with me in 6 weeks.           ______________________________  Everardo All Madilyn Fireman, M.D.     JCH/MEDQ  D:  12/08/2007  T:  12/09/2007  Job:  [id number]

## 2010-11-03 ENCOUNTER — Inpatient Hospital Stay (INDEPENDENT_AMBULATORY_CARE_PROVIDER_SITE_OTHER)
Admission: RE | Admit: 2010-11-03 | Discharge: 2010-11-03 | Disposition: A | Discharge status: Home / Self Care | Payer: BC Managed Care – PPO | Source: Ambulatory Visit | Attending: Emergency Medicine | Admitting: Emergency Medicine

## 2010-11-03 DIAGNOSIS — R7989 Other specified abnormal findings of blood chemistry: Secondary | ICD-10-CM

## 2010-11-10 NOTE — Discharge Summary (Signed)
[name], [name] NO.:  [id number]  MEDICAL RECORD NO.:  [id number]           PATIENT TYPE:  E  LOCATION:  [facility]                         FACILITY:  [facility]  PHYSICIAN:  Leighton Roach Kaiah Hosea, M.D.DATE OF BIRTH:  [birth year]  DATE OF ADMISSION:  10/14/2010 DATE OF DISCHARGE:  10/16/2010                              DISCHARGE SUMMARY   PRIMARY CARE PROVIDER:  Peyton Najjar, MD, at [facility].  DISCHARGE DIAGNOSES: 1. Chest pain. 2. Hypertension. 3. Hyperlipidemia. 4. Palpitations.  DISCHARGE MEDICATIONS:  New medications: 1. Protonix 40 mg p.o. daily 2. Toprol-XL 12.5 mg p.o. daily.  She is continue the following medications: 1. Nitroglycerin 0.4 mg 1 tablet p.o. q.5 minutes p.r.n. 2. Diazepam 5 mg tablet 1/2 tablet p.o. every night p.r.n. for sleep.  CONSULTS:  Cardiology.  PROCEDURES AND IMAGING:  She had a chest x-ray on Oct 14, 2010. Impression:  No active disease.  She had a Myoview on Oct 16, 2010.  Impression: 1. No specific features identified to suggest a MI. 2. Left ventricular EF of 96%. 3. Normal left ventricular systolic function.  LABORATORY DATA:  At the time of admission, WBCs of 7.3, hemoglobin 12.5, hematocrit 37.8, and platelets 379.  UA unremarkable.  Sodium 139, potassium 3.8, chloride 105, bicarb 27, BUN of 15, creatinine 0.82, and glucose of 98.  Point-of-care cardiac enzymes were negative x2.  Cardiac markers during hospitalization were negative x3.  Hemoglobin A1c of 5.8.  At the of discharge, sodium 140, potassium 3.8, chloride 105, bicarb 26, BUN of 17, creatinine of 0.80, and glucose of 85.  WBCs of 6.3, hemoglobin 12.7, hematocrit 39.2, and platelets 383.  BRIEF HOSPITAL COURSE:  This is a 65 year old female with history of recurrent chest pain, here again with chest pain. 1. Chest pain:  The patient was admitted to telemetry and monitored     overnight.  The patient was ruled out for MI with negative  cardiac     enzymes x3.  The patient recently admitted in March and was risk     stratified at that time with lipid profile, TSH, and A1c.  Lipid     profile and TSH were not repeated during this hospitalization,     however, A1c was repeated and found to be improved from previous     A1c.  Cardiology was consulted since the patient has had recurrent     chest pain.  The patient has refused stress test in the past, but     agreed to have Myoview during this hospitalization.  Results of     Myoview were read as normal.  At the time of discharge, the patient     was chest pain free.  She was given prescription for Protonix at     the time of discharge as reflux may be a contributing factor to her     recurrent chest pain.  Also, given a prescription for Toprol to     help with blood pressure control as well as palpitations that may     be contributing to her chest pain. 2. Hypertension:  The patient's blood pressures  were fairly     normotensive during the hospitalization.  Recently, she had been     taken off her atenolol because she said she had trouble sleeping     with this.  For control of her palpitations and continued control     of her blood pressure, she was given prescriptions for Toprol-XL at     the time of discharge.  We will start low-dose and allow primary     care physician to titrate as needed. 3. Hyperlipidemia:  Lipid profile from admission in March 2012 showed     suboptimal control of lipids.  The patient continues to refuse     statin.  Recommended that PCP further pursue to try to get the     patient to consider statin. 4. Palpitations:  The patient states when she has chest pain she has     palpitations associated with this, was on atenolol in the past.     This helped with the palpitations, however, she had difficulty     sleeping with this.  As above, we will discontinue her with low-     dose Toprol-XL to help with palpitations.  PCP can consider     titrating  this if continued to have palpitations and/or suboptimal     blood pressure control.  DISCHARGE INSTRUCTIONS:  The patient was discharged with instructions to have no restrictions in her activity.  She has no restrictions in diet. She is reminded that she should avoid any activities that cause chest pain, shortness of breath, dizziness, sweating, or excessive weakness.  FOLLOWUP:  She is to follow up with Dr. Alwyn Ren at [facility].  She is to make an appointment with him in the next 2-4 weeks. She is also follow up with Dr. Shirlee Latch as needed.  DISCHARGE CONDITION:  The patient is discharged home in stable medical condition.    ______________________________ [name], MD   ______________________________ [name], M.D.    CM/MEDQ  D:  10/17/2010  T:  10/18/2010  Job:  [id number]  cc:   Peyton Najjar, MD  Electronically Signed by [name] MD on 11/02/2010 01:47:24 PM Electronically Signed by Acquanetta Belling M.D. on 11/10/2010 08:35:26 AM

## 2010-11-10 NOTE — H&P (Signed)
[name], [name] NO.:  [id number]  MEDICAL RECORD NO.:  [id number]           PATIENT TYPE:  E  LOCATION:  [facility]                         FACILITY:  [facility]  PHYSICIAN:  Leighton Roach [name], M.D.DATE OF BIRTH:  Sep 27, 1945  DATE OF ADMISSION:  10/14/2010 DATE OF DISCHARGE:                             HISTORY & PHYSICAL   PRIMARY CARE PROVIDER:  Peyton Najjar, MD at [facility].  CHIEF COMPLAINT:  Chest pain.  HISTORY OF PRESENT ILLNESS:  This is a 65 year old female recently in the hospital in March 2012 with chest pain and negative workup here again with chest pain.  States chest pain started at 8 p.m. tonight and it was associated with her heart beating fast and described as tightness in the lower sternum.  It did not radiate and was not associated with activity.  She did have some mild shortness of breath with this episode. She took nitroglycerin at home and her pain went away.  [facility] and was sent to the ED.  She does have risk factors of hypertension and hyperlipidemia.  She states she was recently taken off her atenolol because it was causing her difficulty with sleeping.  She has refused stress test in the past and recently saw Dr. Shirlee Latch in Cardiology in April, but said she refused a stress test at that time as well.  PAST MEDICAL HISTORY: 1. Hypertension. 2. Hyperlipidemia. 3. Recurrent chest pain.  PAST SURGICAL HISTORY:  None.  SOCIAL HISTORY:  No tobacco, alcohol, or drug use.  FAMILY HISTORY:  Coronary artery disease, otherwise noncontributory.  REVIEW OF SYSTEMS:  Denies fever, chills, headache, edema, orthopnea, PND, palpitations, cough, wheezing, hemoptysis, nausea, vomiting, weakness, numbness, dizziness, polyuria, or polydipsia.  ALLERGIES:  No known drug allergies.  MEDICATIONS:  No medications currently.  PHYSICAL EXAMINATION:  VITAL SIGNS:  Temperature 98.2, pulse  65, respirations 18, blood pressure 132/68, SAO2 100% on room air. GENERAL:  No acute distress, alert and oriented x3. HEENT:  Normocephalic and atraumatic.  Pupils equal, round, and reactive to light.  Moist mucous membranes. NECK:  Supple without adenopathy or thyromegaly. CARDIOVASCULAR:  Regular rate and rhythm.  No murmur, rub, or gallop. No carotid bruits. LUNGS:  Clear to auscultation bilaterally.  No wheezes or crackles. ABDOMEN:  Soft, nontender, nondistended.  Bowel sounds are positive. EXTREMITIES:  Without edema.  Dorsalis pedis pulses are 2+ bilaterally. MUSCULOSKELETAL:  Moving all extremities without difficulty.  LABS AND STUDIES:  WBC 7.3, hemoglobin 12.5, hematocrit 37.8, platelets are 379.  UA is within normal limits.  Sodium 139, potassium 3.8, chloride 105, bicarb 27, BUN of 50, creatinine 0.82, glucose 98, total bilirubin 2.3, alk phos 49, AST 49, ALT 20, total protein 6.9, albumin 4, calcium 9.4.  Point-of-care cardiac enzymes, troponins less than 0.05 x2, CK-MB was 1.0 x2.  Chest x-ray.  Impression:  No active disease.  ASSESSMENT/PLAN:  A 65 year old female with history of hypertension and hyperlipidemia here with chest pain. 1. Chest pain.  Chest pain is somewhat typical in nature and that it     is substernal and relieved by nitroglycerin.  Given risk factors,     we will admit to telemetry and monitor.  We will get cardiac     enzymes x3.  Had recent lipid panel in March, we will not obtain     again at this time.  We will check A1c again ensure not a diabetic.     Explained need for treadmill test to the patient, although she is     still asking about having this done.  Continue aspirin and     nitroglycerin for pain.  Repeat EKG in a.m. 2. Hypertension, currently on no medications.  Normotensive in the ED.     We will continue to monitor blood pressures. 3. Hyperlipidemia.  The patient had elevated lipids during     hospitalization.  Refused statin at  that time.  We will continue to     discuss risks and benefits of starting statin. 4. Fluid, electrolytes, and nutrition/gastrointestinal.  Saline lock     IV, heart healthy diet. 5. Prophylaxis, heparin 5000 units subcu t.i.d. 6. Code status, full code. 7. Disposition pending further workup.    ______________________________ [name], MD   ______________________________ [name], M.D.    CM/MEDQ  D:  10/15/2010  T:  10/15/2010  Job:  [id number]  Electronically Signed by [name] MD on 11/02/2010 01:47:27 PM Electronically Signed by Acquanetta Belling M.D. on 11/10/2010 08:35:18 AM

## 2010-11-15 ENCOUNTER — Emergency Department (HOSPITAL_COMMUNITY)
Admission: EM | Admit: 2010-11-15 | Discharge: 2010-11-15 | Disposition: A | Discharge status: Home / Self Care | Payer: BC Managed Care – PPO | Attending: Emergency Medicine | Admitting: Emergency Medicine

## 2010-11-15 ENCOUNTER — Emergency Department (HOSPITAL_COMMUNITY): Payer: BC Managed Care – PPO

## 2010-11-15 DIAGNOSIS — I10 Essential (primary) hypertension: Secondary | ICD-10-CM | POA: Insufficient documentation

## 2010-11-15 DIAGNOSIS — R4701 Aphasia: Secondary | ICD-10-CM | POA: Insufficient documentation

## 2010-11-15 DIAGNOSIS — Z79899 Other long term (current) drug therapy: Secondary | ICD-10-CM | POA: Insufficient documentation

## 2010-11-15 DIAGNOSIS — F410 Panic disorder [episodic paroxysmal anxiety] without agoraphobia: Secondary | ICD-10-CM | POA: Insufficient documentation

## 2010-11-15 DIAGNOSIS — F411 Generalized anxiety disorder: Secondary | ICD-10-CM | POA: Insufficient documentation

## 2010-11-15 LAB — URINALYSIS, ROUTINE W REFLEX MICROSCOPIC
Bilirubin Urine: NEGATIVE
Hgb urine dipstick: NEGATIVE
Specific Gravity, Urine: 1.014 (ref 1.005–1.030)
Urobilinogen, UA: 0.2 mg/dL (ref 0.0–1.0)

## 2010-11-15 LAB — CBC
HCT: 37.6 % (ref 36.0–46.0)
Hemoglobin: 12.6 g/dL (ref 12.0–15.0)
MCH: 30.7 pg (ref 26.0–34.0)
MCV: 91.7 fL (ref 78.0–100.0)
RBC: 4.1 MIL/uL (ref 3.87–5.11)

## 2010-11-15 LAB — COMPREHENSIVE METABOLIC PANEL
AST: 23 U/L (ref 0–37)
Albumin: 4 g/dL (ref 3.5–5.2)
Alkaline Phosphatase: 50 U/L (ref 39–117)
Chloride: 102 mEq/L (ref 96–112)
Creatinine, Ser: 0.77 mg/dL (ref 0.4–1.2)
GFR calc Af Amer: >60 mL/min (ref >60)
Potassium: 4 mEq/L (ref 3.5–5.1)
Total Bilirubin: 0.3 mg/dL (ref 0.3–1.2)

## 2010-11-15 LAB — TROPONIN I: Troponin I: <0.3 ng/mL (ref <0.30)

## 2011-03-21 LAB — POCT CARDIAC MARKERS
Operator id: 4533
Troponin i, poc: <0.05

## 2011-03-22 LAB — COMPREHENSIVE METABOLIC PANEL
ALT: 27
Alkaline Phosphatase: 57
BUN: 15
CO2: 26
Chloride: 106
GFR calc non Af Amer: >60
Glucose, Bld: 116 — ABNORMAL HIGH
Potassium: 3.8
Sodium: 140
Total Bilirubin: 0.8

## 2011-03-22 LAB — DIFFERENTIAL
Basophils Absolute: 0
Eosinophils Relative: 1
Lymphocytes Relative: 19
Lymphs Abs: 1.6
Neutro Abs: 6.3
Neutrophils Relative %: 72

## 2011-03-22 LAB — POCT CARDIAC MARKERS
Myoglobin, poc: 74.2
Operator id: 4533
Troponin i, poc: <0.05

## 2011-03-22 LAB — CBC
HCT: 36.6
Platelets: 430 — ABNORMAL HIGH
RDW: 12.5
WBC: 8.8

## 2011-03-22 LAB — LIPASE, BLOOD: Lipase: 29

## 2011-04-01 ENCOUNTER — Emergency Department (HOSPITAL_COMMUNITY): Payer: BC Managed Care – PPO

## 2011-04-01 ENCOUNTER — Emergency Department (HOSPITAL_COMMUNITY)
Admission: EM | Admit: 2011-04-01 | Discharge: 2011-04-02 | Disposition: A | Discharge status: Nursing Home (Includes ICF) | Payer: BC Managed Care – PPO | Source: Home / Self Care | Attending: Emergency Medicine | Admitting: Emergency Medicine

## 2011-04-01 DIAGNOSIS — I10 Essential (primary) hypertension: Secondary | ICD-10-CM | POA: Insufficient documentation

## 2011-04-01 DIAGNOSIS — I214 Non-ST elevation (NSTEMI) myocardial infarction: Secondary | ICD-10-CM | POA: Insufficient documentation

## 2011-04-01 DIAGNOSIS — Z79899 Other long term (current) drug therapy: Secondary | ICD-10-CM | POA: Insufficient documentation

## 2011-04-01 DIAGNOSIS — F411 Generalized anxiety disorder: Secondary | ICD-10-CM | POA: Insufficient documentation

## 2011-04-01 DIAGNOSIS — K219 Gastro-esophageal reflux disease without esophagitis: Secondary | ICD-10-CM | POA: Insufficient documentation

## 2011-04-01 LAB — POCT I-STAT TROPONIN I: Troponin i, poc: 0.16 ng/mL (ref 0.00–0.08)

## 2011-04-01 LAB — POCT I-STAT, CHEM 8
BUN: 27 mg/dL — ABNORMAL HIGH (ref 6–23)
Calcium, Ion: 1.14 mmol/L (ref 1.12–1.32)
Chloride: 106 mEq/L (ref 96–112)
Creatinine, Ser: 1.2 mg/dL — ABNORMAL HIGH (ref 0.50–1.10)
Glucose, Bld: 121 mg/dL — ABNORMAL HIGH (ref 70–99)
HCT: 39 % (ref 36.0–46.0)
Hemoglobin: 13.3 g/dL (ref 12.0–15.0)
Potassium: 3.9 mEq/L (ref 3.5–5.1)
Sodium: 139 mEq/L (ref 135–145)
TCO2: 25 mmol/L (ref 0–100)

## 2011-04-02 ENCOUNTER — Inpatient Hospital Stay (HOSPITAL_COMMUNITY)
Admit: 2011-04-02 | Discharge: 2011-04-05 | DRG: 853 | Disposition: A | Discharge status: Home / Self Care | Payer: BC Managed Care – PPO | Attending: Cardiology | Admitting: Cardiology

## 2011-04-02 DIAGNOSIS — E785 Hyperlipidemia, unspecified: Secondary | ICD-10-CM | POA: Diagnosis present

## 2011-04-02 DIAGNOSIS — I2510 Atherosclerotic heart disease of native coronary artery without angina pectoris: Secondary | ICD-10-CM

## 2011-04-02 DIAGNOSIS — Z7982 Long term (current) use of aspirin: Secondary | ICD-10-CM

## 2011-04-02 DIAGNOSIS — R7309 Other abnormal glucose: Secondary | ICD-10-CM | POA: Diagnosis present

## 2011-04-02 DIAGNOSIS — I214 Non-ST elevation (NSTEMI) myocardial infarction: Principal | ICD-10-CM | POA: Diagnosis present

## 2011-04-02 DIAGNOSIS — Z7902 Long term (current) use of antithrombotics/antiplatelets: Secondary | ICD-10-CM

## 2011-04-02 DIAGNOSIS — I10 Essential (primary) hypertension: Secondary | ICD-10-CM | POA: Diagnosis present

## 2011-04-02 LAB — LIPID PANEL
Cholesterol: 200 mg/dL (ref 0–200)
LDL Cholesterol: 134 mg/dL — ABNORMAL HIGH (ref 0–99)
Triglycerides: 74 mg/dL (ref <150)

## 2011-04-02 LAB — CARDIAC PANEL(CRET KIN+CKTOT+MB+TROPI)
CK, MB: 3 ng/mL (ref 0.3–4.0)
Relative Index: 2.7 — ABNORMAL HIGH (ref 0.0–2.5)
Total CK: 120 U/L (ref 7–177)
Total CK: 112 U/L (ref 7–177)
Troponin I: 0.38 ng/mL

## 2011-04-02 LAB — URINALYSIS, ROUTINE W REFLEX MICROSCOPIC
Bilirubin Urine: NEGATIVE
Glucose, UA: NEGATIVE mg/dL
Hgb urine dipstick: NEGATIVE
Ketones, ur: NEGATIVE mg/dL
Leukocytes, UA: NEGATIVE
Nitrite: NEGATIVE
Protein, ur: NEGATIVE mg/dL
Specific Gravity, Urine: 1.018 (ref 1.005–1.030)
Urobilinogen, UA: 0.2 mg/dL (ref 0.0–1.0)
pH: 6 (ref 5.0–8.0)

## 2011-04-02 LAB — CBC
HCT: 38.8 % (ref 36.0–46.0)
Hemoglobin: 12.5 g/dL (ref 12.0–15.0)
MCH: 29.8 pg (ref 26.0–34.0)
MCHC: 32.2 g/dL (ref 30.0–36.0)
MCV: 92.6 fL (ref 78.0–100.0)
Platelets: 465 K/uL — ABNORMAL HIGH (ref 150–400)
RBC: 4.19 MIL/uL (ref 3.87–5.11)
RDW: 12.5 % (ref 11.5–15.5)
WBC: 8.5 K/uL (ref 4.0–10.5)

## 2011-04-02 LAB — COMPREHENSIVE METABOLIC PANEL
Albumin: 3.9 g/dL (ref 3.5–5.2)
Alkaline Phosphatase: 72 U/L (ref 39–117)
BUN: 18 mg/dL (ref 6–23)
CO2: 25 mEq/L (ref 19–32)
Chloride: 106 mEq/L (ref 96–112)
Glucose, Bld: 129 mg/dL — ABNORMAL HIGH (ref 70–99)
Potassium: 4 mEq/L (ref 3.5–5.1)
Total Bilirubin: 0.2 mg/dL — ABNORMAL LOW (ref 0.3–1.2)

## 2011-04-02 LAB — DIFFERENTIAL
Lymphs Abs: 2.8 10*3/uL (ref 0.7–4.0)
Monocytes Absolute: 0.6 10*3/uL (ref 0.1–1.0)
Monocytes Relative: 7 % (ref 3–12)
Neutro Abs: 4.8 10*3/uL (ref 1.7–7.7)
Neutrophils Relative %: 57 % (ref 43–77)

## 2011-04-02 LAB — HEMOGLOBIN A1C
Hgb A1c MFr Bld: 5.8 % — ABNORMAL HIGH
Mean Plasma Glucose: 120 mg/dL — ABNORMAL HIGH

## 2011-04-02 LAB — TROPONIN I: Troponin I: 0.59 ng/mL

## 2011-04-02 LAB — GLUCOSE, CAPILLARY: Glucose-Capillary: 139 mg/dL — ABNORMAL HIGH (ref 70–99)

## 2011-04-02 LAB — MRSA PCR SCREENING: MRSA by PCR: NEGATIVE

## 2011-04-02 LAB — CK TOTAL AND CKMB (NOT AT ARMC): Relative Index: 2.4 (ref 0.0–2.5)

## 2011-04-04 LAB — GLUCOSE, CAPILLARY: Glucose-Capillary: 90 mg/dL (ref 70–99)

## 2011-04-04 LAB — PROTIME-INR: Prothrombin Time: 12.9 seconds (ref 11.6–15.2)

## 2011-04-05 DIAGNOSIS — I214 Non-ST elevation (NSTEMI) myocardial infarction: Secondary | ICD-10-CM

## 2011-04-05 LAB — CBC
HCT: 37 % (ref 36.0–46.0)
Hemoglobin: 12.5 g/dL (ref 12.0–15.0)
MCH: 30.3 pg (ref 26.0–34.0)
MCHC: 33.8 g/dL (ref 30.0–36.0)

## 2011-04-05 LAB — BASIC METABOLIC PANEL
BUN: 18 mg/dL (ref 6–23)
Calcium: 9.1 mg/dL (ref 8.4–10.5)
GFR calc non Af Amer: 89 mL/min — ABNORMAL LOW (ref >90)
Glucose, Bld: 92 mg/dL (ref 70–99)
Sodium: 139 mEq/L (ref 135–145)

## 2011-04-06 ENCOUNTER — Telehealth: Payer: Self-pay | Admitting: Cardiology

## 2011-04-06 NOTE — Telephone Encounter (Signed)
Patient calling c/o low blood pressure 88/42.

## 2011-04-06 NOTE — Telephone Encounter (Signed)
I talked with pt.  Pt states she took losartan 50mg  about 9am today. Pt states she checked her BP just a short time ago and it was 86/42. Pt states she is feeling OK now. I encouraged pt to drink some water now. I reviewed with Dr Shirlee Latch. He recommended pt stop losartan and  drink fluids. Pt has an appt 04/26/11 with Tereso Newcomer. Dr Shirlee Latch recommended that I arrange a sooner appt  for pt with him for follow-up.  I talked with pt again. Pt states her BP is up to  100 systolic. Pt declined a sooner  follow-up appt with Dr Shirlee Latch. She thought her appt 04/26/11 was with Dr Swaziland and she wants Dr Swaziland to be her doctor since he put the stent in. I told pt I would forward this message to Dr Swaziland and his nurse for follow-up.

## 2011-04-06 NOTE — Telephone Encounter (Signed)
This is not a Dr. Swaziland pt. tx

## 2011-04-07 ENCOUNTER — Emergency Department (HOSPITAL_COMMUNITY)
Admission: EM | Admit: 2011-04-07 | Discharge: 2011-04-07 | Disposition: A | Discharge status: Home / Self Care | Payer: BC Managed Care – PPO | Attending: Emergency Medicine | Admitting: Emergency Medicine

## 2011-04-07 ENCOUNTER — Emergency Department (HOSPITAL_COMMUNITY): Payer: BC Managed Care – PPO

## 2011-04-07 DIAGNOSIS — Z9889 Other specified postprocedural states: Secondary | ICD-10-CM | POA: Insufficient documentation

## 2011-04-07 DIAGNOSIS — R079 Chest pain, unspecified: Secondary | ICD-10-CM | POA: Insufficient documentation

## 2011-04-07 DIAGNOSIS — Z79899 Other long term (current) drug therapy: Secondary | ICD-10-CM | POA: Insufficient documentation

## 2011-04-07 DIAGNOSIS — E7800 Pure hypercholesterolemia, unspecified: Secondary | ICD-10-CM | POA: Insufficient documentation

## 2011-04-07 DIAGNOSIS — I2510 Atherosclerotic heart disease of native coronary artery without angina pectoris: Secondary | ICD-10-CM | POA: Insufficient documentation

## 2011-04-07 DIAGNOSIS — I10 Essential (primary) hypertension: Secondary | ICD-10-CM | POA: Insufficient documentation

## 2011-04-07 DIAGNOSIS — Z7982 Long term (current) use of aspirin: Secondary | ICD-10-CM | POA: Insufficient documentation

## 2011-04-07 LAB — BASIC METABOLIC PANEL
CO2: 24 mEq/L (ref 19–32)
Calcium: 9.6 mg/dL (ref 8.4–10.5)
Creatinine, Ser: 0.83 mg/dL (ref 0.50–1.10)
Glucose, Bld: 135 mg/dL — ABNORMAL HIGH (ref 70–99)

## 2011-04-07 LAB — POCT I-STAT TROPONIN I: Troponin i, poc: 0.31 ng/mL (ref 0.00–0.08)

## 2011-04-07 LAB — DIFFERENTIAL
Basophils Absolute: 0.1 10*3/uL (ref 0.0–0.1)
Basophils Relative: 1 % (ref 0–1)
Eosinophils Relative: 1 % (ref 0–5)
Monocytes Absolute: 0.6 10*3/uL (ref 0.1–1.0)

## 2011-04-07 LAB — CBC
MCHC: 34 g/dL (ref 30.0–36.0)
Platelets: 460 10*3/uL — ABNORMAL HIGH (ref 150–400)
RDW: 12.6 % (ref 11.5–15.5)

## 2011-04-07 NOTE — H&P (Signed)
  [name], [name] NO.:  [id number]  MEDICAL RECORD NO.:  [id number]  LOCATION:  2922                         FACILITY:  [facility]  PHYSICIAN:  Armanda Magic, M.D.     DATE OF BIRTH:  Oct 07, 1945  DATE OF ADMISSION:  04/02/2011 DATE OF DISCHARGE:                             HISTORY & PHYSICAL   PRIMARY CARDIOLOGIST:  Peter M. Swaziland, M.D.  REFERRING PHYSICIAN:  Raeford Razor, MD  CHIEF COMPLAINT:  Chest pain.  HPI:  This is a 65 year old female who presented to [facility] Room with complaint of substernal chest pain.  The chest pain started about 10 p.m. and was associated with diaphoresis.  She said she took her blood pressure and it was high.  She took her lisinopril which she takes only when her blood pressure is high.  She describes the pain as a heartburn sensation and resolved after she belched.  She says the pain lasted about 20 minutes.  She denied any shortness of breath, nausea, vomiting.  She currently is pain free.  PAST MEDICAL HISTORY:  Includes hypertension, stress Myoview showed no ischemia in May 2012, prediabetes.  PAST SURGICAL HISTORY:  Foot surgery, C-section, and tonsillectomy.  SOCIAL HISTORY:  She is married with 1 child.  She denies any tobacco or alcohol use.  ALLERGIES:  [name].  MEDICATIONS:  Include lisinopril p.r.n. for elevated blood pressure.  FAMILY HISTORY:  Mother died of old age.  Father died of bladder CA. She has 6 siblings, 1 with a CVA.  All 10-point review of systems otherwise stated in HPI is negative.  PHYSICAL EXAMINATION:  VITAL SIGNS: Blood pressure is 166/70, heart rate 84, respirations 19, O2 saturations 100% on 2 L. GENERAL: Well-developed, well-nourished female, in no acute distress. HEENT: Benign. NECK: Supple without lymphadenopathy.  Carotid upstrokes +2 bilaterally. No bruits. LUNGS: Clear to auscultation throughout. HEART: Regular rate and rhythm.  No murmurs, rubs, or gallops.   Normal S1, S2. ABDOMEN: Soft, nontender, nondistended.  Normoactive bowel sounds.  No hepatosplenomegaly. EXTREMITIES:  No cyanosis, erythema, or edema.  LABORATORY DATA:  Sodium 139, potassium 3.9, chloride 106, CO2 of 25, BUN 27, creatinine 1.23, glucose 121, hemoglobin 13.3, hematocrit 39, CPK 120, troponin 0.16.  Chest x-ray is pending.  EKG shows sinus rhythm with nonspecific ST changes.  ASSESSMENT: 1. Chest pain with positive troponin but normal CPK, questionable non     ST elevation MI.  Symptoms are atypical, and chest pain is resolved     after belching.  Her cardiac risk factors include hypertension,     age, and postmenopausal state 2. Hypertension.  PLAN:  Admit to telemetry bed.  We will cycle cardiac enzymes.  IV heparin drip per pharmacy.  IV nitroglycerin drip at 10 mcg/minute.  We will start aspirin, Lopressor 25 mg b.i.d., check a stat D-dimer. Further workup with probable cath per Dr. Peter Swaziland.     Armanda Magic, M.D.     TT/MEDQ  D:  04/03/2011  T:  04/04/2011  Job:  [id number]  Electronically Signed by Armanda Magic M.D. on 04/07/2011 02:13:25 PM

## 2011-04-07 NOTE — Cardiovascular Report (Signed)
  [name], [name] NO.:  [id number]  MEDICAL RECORD NO.:  [id number]  LOCATION:  2507                         FACILITY:  [facility]  PHYSICIAN:  [name] Pick. Eden Emms, MD, FACCDATE OF BIRTH:  1946/05/21  DATE OF PROCEDURE: DATE OF DISCHARGE:                           CARDIAC CATHETERIZATION   [name] is a 65 year old Filipino woman.  She had chest pain in May and apparently had a normal Myoview study.  She was admitted to the hospital with recurrent chest pain.  A diagnostic study was done to rule out coronary disease.  Cine catheterization was done with 5-French catheters from the right femoral artery.  JL-4 catheter was used to engage the left main.  It was somewhat large for her aortic root.  The JR-4 catheter was used to engage the right coronary artery.  Left main coronary artery was normal.  Left anterior descending artery had a 95% discrete stenosis in the mid vessel.  This was after the first septal perforator and diagonal branch. There was normal TIMI flow.  The mid and distal LAD were normal.  First diagonal branch was a medium-sized vessel and was normal.  Circumflex coronary artery was nondominant.  There was a small first obtuse marginal branch, a small second obtuse marginal branch.  Both were normal.  The proximal and mid circumflex were normal.  The third obtuse marginal branch was large and normal.  There was a small AV groove branch.  Right coronary artery was dominant.  It was normal.  There were no right- to-left collaterals.  RAO ventriculography.  RAO ventriculography was normal.  EF was 60%. There was no gradient across the aortic valve and no MR.  Aortic pressure was 130/61.  LV pressure was 130/1 with a post A-wave EDP of 3.  IMPRESSION:  Films were reviewed with Dr. Excell Seltzer.  She will proceed with stenting of the mid LAD.  The patient tolerated the diagnostic procedure well.     [name] Pick. Eden Emms, MD, [facility]     PCN/MEDQ   D:  04/04/2011  T:  04/05/2011  Job:  [id number]  Electronically Signed by Charlton Haws MD [facility] on 04/07/2011 12:28:15 PM

## 2011-04-08 NOTE — Telephone Encounter (Signed)
Spoke w/pt. Advised her she needed to see Tereso Newcomer in follow up of her cath. Also advised her that Dr. Excell Seltzer did her heart cath not Dr. [place] and that Dr. Shirlee Latch is her primary cardiologist. She will follow up w/[facility]

## 2011-04-10 NOTE — Cardiovascular Report (Signed)
  NAMEAIVA, [name] NO.:  [id number]  MEDICAL RECORD NO.:  [id number]  LOCATION:  2507                         FACILITY:  [facility]  PHYSICIAN:  Veverly Fells. Excell Seltzer, MD  DATE OF BIRTH:  [birth year]  DATE OF PROCEDURE:  04/04/2011 DATE OF DISCHARGE:                           CARDIAC CATHETERIZATION   PROCEDURE:  Percutaneous transluminal coronary angioplasty and stenting of the proximal left anterior descending.  PROCEDURAL INDICATION:  [name] is a 65 year old woman who presented with acute coronary syndrome.  She ruled in for a non-ST elevation infarction.  She underwent diagnostic catheterization this morning by Dr. Eden Emms.  This demonstrated a critical 95% stenosis of the proximal LAD.  There was a long tubular lesion.  There was no other significant coronary artery disease noted, and the patient's left ventricular ejection fraction was within normal limits.  We elected to proceed with PCI.  The patient had been treated with aspirin and heparin.  She was given 600 mg of Plavix on the cath table.  Angiomax was started for anticoagulation.  Once a therapeutic ACT was achieved, a 6-French XB LAD 3.5 cm guide catheter was advanced.  A cougar wire was advanced easily into the distal LAD.  The vessel was pre-dilated with a 2.5 x 15 mm apex balloon.  The balloon was inflated to 6 atmospheres for a total of 2 inflations.  The balloon was left in place and angiography performed to assess lesion length.  I elected to use a 3.5 x 20 mm PROMUS Element drug-eluting stent.  The stent was carefully positioned and deployed at 12 atmospheres.  It appeared well expanded.  The stent was postdilated with a 3.25 x 15 mm Atlantic Quantum apex balloon, which was dilated within the stent on 2 inflations up to 14 atmospheres.  The entire stented segment was postdilated.  There was an excellent angiographic result with 0% residual stenosis.  There was no significant side  branch compromise.  The patient tolerated the entire procedure well without immediate complications.  The femoral angiogram showed arterial access was right at the distal common femoral bifurcation, and I elected not to do a mechanical hemostasis because of that.  FINAL ASSESSMENT:  Successful percutaneous intervention of the proximal LAD using a drug-eluting stent platform.  RECOMMENDATIONS:  The patient should receive dual anti-platelet therapy with aspirin and Plavix for a minimum of 12 months.     Veverly Fells. Excell Seltzer, MD     MDC/MEDQ  D:  04/04/2011  T:  04/05/2011  Job:  [id number]  Electronically Signed by Tonny Bollman MD on 04/10/2011 10:06:19 PM

## 2011-04-12 ENCOUNTER — Telehealth: Payer: Self-pay | Admitting: Cardiology

## 2011-04-12 NOTE — Telephone Encounter (Signed)
Pt has questions about cardiac rehab please call

## 2011-04-12 NOTE — Telephone Encounter (Signed)
[name] wants to know if Dr [place] feels she would benefit from Cardiac Rehab?

## 2011-04-13 NOTE — Telephone Encounter (Signed)
Pt calling wanting to know if she should do cardiac rehab. Please return pt call to discuss further.

## 2011-04-13 NOTE — Telephone Encounter (Signed)
Called wanting to know if should do cardiac rehab. Advised that she is Dr. Alford Highland pt not Dr. Elvis Coil. Will transfer to Dr. Shirlee Latch.

## 2011-04-14 NOTE — Telephone Encounter (Signed)
I talked with pt. Pt asking if she should attend cardiac rehab. I reviewed with Dr Shirlee Latch. Dr Shirlee Latch did recommend participation in cardiac rehab. I have talked with pt and she is aware Dr Shirlee Latch did recommend participation in cardiac rehab. Dr Shirlee Latch has signed an order for cardiac rehab and order has been forwarded to HIM to be faxed.

## 2011-04-20 NOTE — Discharge Summary (Signed)
[name], [name] NO.:  [id number]  MEDICAL RECORD NO.:  [id number]  LOCATION:  2507                         FACILITY:  [facility]  PHYSICIAN:  Khaliel Morey M. [place], M.D.  DATE OF BIRTH:  June 21, 1945  DATE OF ADMISSION:  04/02/2011 DATE OF DISCHARGE:  04/05/2011                              DISCHARGE SUMMARY   DISCHARGE DIAGNOSES: 1. Non-ST segment myocardial infarction. 2. Coronary artery disease, status post drug-eluting stent to the left     anterior descending. 3. Hyperlipidemia. 4. Hypertension. 5. Prediabetes.  PAST SURGICAL HISTORY: 1. Foot surgery. 2. C-section. 3. Tonsillectomy.  HOSPITAL COURSE:  [name] is a pleasant 65 year old female who presented to [facility] Emergency Room with complaints of substernal chest pain.  A chest pain started about 10 p.m. and was associated with diaphoresis.  The pain was described as a heartburn sensation, which resolved after she felt, lasted about 20 minutes and was pain-free while at the [facility] Emergency Room.  She denied any shortness of breath, nausea, and vomiting.  She was started to have positive troponin, but normal CPK and was later diagnosed with an NSTEMI.  She was subsequently admitted to telemetry bed.  Cardiac enzymes were then cycled and IV heparin drip initiated as well as IV nitroglycerin drip 10 mcg/minute, which was discontinued shortly after due to the patient's complaint of headache.  Due to her NSTEMI diagnosis, she underwent cardiac catheterization by Dr. Excell Seltzer on April 04, 2011, where it was found that an occlusion was located in the proximal LAD of 95%.  A Promus drug- eluting stent was placed and the area of the occlusion at which time, it was determined the occlusion measured 0%.  The patient tolerated the procedure well without any complications during her operative procedure.  DISCHARGE LABORATORY DATA:  CBC:  White blood cell count 8.5, hemoglobin 12.5, hematocrit 37.0,  platelet count 403.  Basic metabolic panel; sodium 139, potassium 3.5, chloride 102, bicarb and CO2 of 26, glucose 92, BUN 18, creatinine 0.72.  STUDIES:  Cardiac catheterization - the patient received Promus drug- eluting stent placement to the proximal LAD, which was found to be 95% occluded prior to stent placement and 0% occluded after.  EKG - April 05, 2011, rate 65 beats per minute, rhythm, normal sinus rhythm, no ST changes, T-wave inversions in leads V1-V5 unchanged from previous EKGs.  DISCHARGE MEDICATIONS: 1. Aspirin 81 mg p.o. daily. 2. Clopidogrel 75 mg p.o. daily with the meal. 3. Lipitor 40 mg p.o. daily. 4. Losartan 50 mg p.o. daily. 5. Nitroglycerin 0.4 mg sublingual every 5 minutes as needed up to 3     doses as needed.  DISCHARGE INSTRUCTIONS:  Discharge in stable condition to home.  The patient was instructed to increase activity slowly, she may shower, but not bathe for 1 week.  She was advised not to lift, heavy objects over 5 pounds for 1 week.  She was advised not to drive for 1 week.  She was advised to abstain from sexual activity for 1 week.  She was advised to follow a low-sodium, heart-healthy diet.  She was educated on proper cleaning techniques of her catheterization site and was advised to call [phone] that  she experiences any swelling, pus, or increased pain in the area.  A followup appointment was made with [facility] with Dr. Shirlee Latch.  She was advised to see the PA, Tereso Newcomer, on April 26, 2011, at 3:30 p.m.  She was advised to follow up with her primary care physician, Dr. Raeford Razor.  She was advised not to strain and to stop any activity that causes chest pain, shortness of breath, dizziness, sweating, or excessive weakness.  DURATION OF DISCHARGE ENCOUNTER:  Greater than 30 minutes including MD and PA time.    ______________________________ Odella Aquas, PA-C   ______________________________ Blaike Newburn M.  [place], M.D.    RA/MEDQ  D:  04/05/2011  T:  04/05/2011  Job:  [id number]  cc:   Marca Ancona, MD Raeford Razor, MD  Electronically Signed by Odella Aquas PA on 04/14/2011 02:46:18 PM Electronically Signed by Ercie Eliasen [place] M.D. on 04/20/2011 04:47:22 PM

## 2011-04-26 ENCOUNTER — Ambulatory Visit (INDEPENDENT_AMBULATORY_CARE_PROVIDER_SITE_OTHER): Payer: BC Managed Care – PPO | Admitting: Physician Assistant

## 2011-04-26 ENCOUNTER — Encounter: Payer: BC Managed Care – PPO | Admitting: Physician Assistant

## 2011-04-26 ENCOUNTER — Encounter: Payer: Self-pay | Admitting: Physician Assistant

## 2011-04-26 DIAGNOSIS — I10 Essential (primary) hypertension: Secondary | ICD-10-CM

## 2011-04-26 DIAGNOSIS — R079 Chest pain, unspecified: Secondary | ICD-10-CM

## 2011-04-26 DIAGNOSIS — E785 Hyperlipidemia, unspecified: Secondary | ICD-10-CM

## 2011-04-26 DIAGNOSIS — I2510 Atherosclerotic heart disease of native coronary artery without angina pectoris: Secondary | ICD-10-CM

## 2011-04-26 DIAGNOSIS — R002 Palpitations: Secondary | ICD-10-CM | POA: Insufficient documentation

## 2011-04-26 NOTE — Assessment & Plan Note (Signed)
We discussed the importance of dual antiplatelet therapy.  Continue statin.  I would like her to be on a beta blocker but she notes significant side effects in the past.  She is interested in cardiac rehab.  She may start this if her LHC is ok.

## 2011-04-26 NOTE — Patient Instructions (Addendum)
I WILL CALL YOU TOMORROW WITH THE CATH DATE AND TIME AND INSTRUCTIONS 04/26/11.    Your physician has requested that you have a cardiac catheterization WILL BE DONE ON 04/29/11 @ 10:30 AM WITH DR. Theron Arista [place], YOU WILL NEED TO BE THERE BY 9:30 AM, PLEASE FOLLOW THE INSTRUCTION LETTER THAT HAS BEEN GIVEN TO YOU TODAY. Cardiac catheterization is used to diagnose and/or treat various heart conditions. Doctors may recommend this procedure for a number of different reasons. The most common reason is to evaluate chest pain. Chest pain can be a symptom of coronary artery disease (CAD), and cardiac catheterization can show whether plaque is narrowing or blocking your heart's arteries. This procedure is also used to evaluate the valves, as well as measure the blood flow and oxygen levels in different parts of your heart. For further information please visit [email or web address] Please follow instruction sheet, as given.

## 2011-04-26 NOTE — Assessment & Plan Note (Signed)
She will need follow up L/L in 6 weeks.  Will arrange this at her follow up post cath.

## 2011-04-26 NOTE — Assessment & Plan Note (Signed)
She has continued symptoms like she had with her MI.  Although atypical, this is concerning.  Her ECG is stable without significant changes.  She has taken NTG with relief.  She went to the ED 2 days after d/c and was noted to have an elevated troponin. But, this was thought to be trending down from her NSTEMI.  I discussed her case with Dr. Verne Carrow.  We have decided that relook LHC is the most appropriate.  I discussed this with the patient and her husband.  Risks and benefits of cardiac catheterization have been discussed with the patient.  These include bleeding, infection, kidney damage, stroke, heart attack, death.  The patient understands these risks and is willing to proceed.  We will arrange this later this week.  She knows to go to the ED if she has worse symptoms.

## 2011-04-26 NOTE — Progress Notes (Signed)
History of Present Illness: PCP:  Dr. David Hopper  Primary Cardiologist:  Dr. Dalton McLean  [name] is a 64 y.o. female who was seen by Dr. Dalton McLean earlier this year for chest pain.  She refused stress testing in the past.  She was admitted to [facility] 10/20-10/23 with chest pain and ruled in for NSTEMI.  LHC demonstrated single vessel CAD with 95% mLAD and this was tx with a Promus DES.  EF was preserved. She was reluctant to start most medications.  She refused beta blocker due to h/o side effects in the past.  Importance of taking medication was discussed.  She presents for follow up.  She has a multitude of symptoms.  She notes continued chest pain that can come on at rest.  It is associated with rapid palpitations and diaphoresis.  She takes NTG with relief.  This was her symptom with her MI.  She denies any change since PCI.  Her husband thinks she was somewhat better at first.  She notes fatigue.  Her husband has to push her around at the store now in a wheelchair or scooter.  She tries to walk.  If she gets to about 30 minutes of walking she notes increased heart rate and chest pain.  No orthopnea, PND or edema.  No dyspnea.  No syncope.  She feels dizzy with the cozaar.  She also notes that her lips are chapped and  Bleeding.  No hemoptysis.    Past Medical History  Diagnosis Date  . GERD (gastroesophageal reflux disease)   . Hiatal hernia   . Arthritis   . HTN (hypertension)   . HLD (hyperlipidemia)   . CAD (coronary artery disease)     s/p NSTEMI 10/12: LHC 04/02/11: mLAD 95%, EF 60%; PCI: Promus DES to mLAD    Current Outpatient Prescriptions  Medication Sig Dispense Refill  . aspirin 81 MG chewable tablet Chew 81 mg by mouth daily.        . atorvastatin (LIPITOR) 40 MG tablet       . clopidogrel (PLAVIX) 75 MG tablet       . losartan (COZAAR) 50 MG tablet Take 50 mg by mouth daily.        . NITROSTAT 0.4 MG SL tablet As needed for chest pain every 5 minutes up to 3  times        Allergies: Allergies  Allergen Reactions  . Ibuprofen     REACTION: rapid heart rate    History  Substance Use Topics  . Smoking status: Never Smoker   . Smokeless tobacco: Not on file  . Alcohol Use: No     ROS:  Please see the history of present illness.   All other systems reviewed and negative.   Vital Signs: BP 130/64  Pulse 76  Ht 4' 8" (1.422 m)  Wt 95 lb (43.092 kg)  BMI 21.30 kg/m2  PHYSICAL EXAM: Well nourished, well developed, in no acute distress HEENT: normal Neck: no JVD Cardiac:  normal S1, S2; RRR; no murmur Lungs:  clear to auscultation bilaterally, no wheezing, rhonchi or rales Abd: soft, nontender, no hepatomegaly Ext: no edema; RFA site without hematoma or bruit Skin: warm and dry Neuro:  CNs 2-12 intact, no focal abnormalities noted Psych: normal affect  EKG:   NSR, HR 76, normal axis, NSSTTW changes, no significant change since prior tracing  ASSESSMENT AND PLAN:  

## 2011-04-26 NOTE — Assessment & Plan Note (Signed)
If her LHC looks ok, consider getting an event monitor to further qualify these.

## 2011-04-26 NOTE — Assessment & Plan Note (Signed)
Controlled.  Continue current therapy.  

## 2011-04-27 ENCOUNTER — Ambulatory Visit (INDEPENDENT_AMBULATORY_CARE_PROVIDER_SITE_OTHER): Payer: BC Managed Care – PPO | Admitting: *Deleted

## 2011-04-27 ENCOUNTER — Encounter: Payer: Self-pay | Admitting: *Deleted

## 2011-04-27 DIAGNOSIS — I2510 Atherosclerotic heart disease of native coronary artery without angina pectoris: Secondary | ICD-10-CM

## 2011-04-27 DIAGNOSIS — I10 Essential (primary) hypertension: Secondary | ICD-10-CM

## 2011-04-27 DIAGNOSIS — E785 Hyperlipidemia, unspecified: Secondary | ICD-10-CM

## 2011-04-27 LAB — CBC WITH DIFFERENTIAL/PLATELET
Basophils Absolute: 0 10*3/uL (ref 0.0–0.1)
Eosinophils Absolute: 0.2 10*3/uL (ref 0.0–0.7)
Eosinophils Relative: 3.2 % (ref 0.0–5.0)
HCT: 36.5 % (ref 36.0–46.0)
Lymphs Abs: 1.2 10*3/uL (ref 0.7–4.0)
MCHC: 33 g/dL (ref 30.0–36.0)
MCV: 92.8 fl (ref 78.0–100.0)
Monocytes Absolute: 0.3 10*3/uL (ref 0.1–1.0)
Neutrophils Relative %: 67.8 % (ref 43.0–77.0)
Platelets: 407 10*3/uL — ABNORMAL HIGH (ref 150.0–400.0)
RDW: 13.1 % (ref 11.5–14.6)

## 2011-04-27 LAB — BASIC METABOLIC PANEL
BUN: 20 mg/dL (ref 6–23)
CO2: 27 mEq/L (ref 19–32)
Calcium: 9.2 mg/dL (ref 8.4–10.5)
Chloride: 102 mEq/L (ref 96–112)
Creatinine, Ser: 0.8 mg/dL (ref 0.4–1.2)

## 2011-04-27 LAB — PROTIME-INR: INR: 1 ratio (ref 0.8–1.0)

## 2011-04-27 NOTE — Progress Notes (Signed)
Addended by: Tarri Fuller on: 04/27/2011 09:09 AM   Modules accepted: Orders

## 2011-04-28 ENCOUNTER — Telehealth: Payer: Self-pay | Admitting: Physician Assistant

## 2011-04-28 NOTE — Telephone Encounter (Signed)
Pt aware of lab results today that all pre cath labs are normal per Tereso Newcomer, PA-C. Danielle Rankin

## 2011-04-28 NOTE — Telephone Encounter (Signed)
Pt returning call to Carol. Please call back.  

## 2011-04-29 ENCOUNTER — Telehealth: Payer: Self-pay | Admitting: *Deleted

## 2011-04-29 ENCOUNTER — Inpatient Hospital Stay (HOSPITAL_BASED_OUTPATIENT_CLINIC_OR_DEPARTMENT_OTHER)
Admission: RE | Admit: 2011-04-29 | Discharge: 2011-04-29 | Disposition: A | Discharge status: Home / Self Care | Payer: BC Managed Care – PPO | Source: Ambulatory Visit | Attending: Cardiology | Admitting: Cardiology

## 2011-04-29 ENCOUNTER — Telehealth: Payer: Self-pay | Admitting: Cardiology

## 2011-04-29 ENCOUNTER — Encounter (HOSPITAL_BASED_OUTPATIENT_CLINIC_OR_DEPARTMENT_OTHER)
Admission: RE | Disposition: A | Discharge status: Home / Self Care | Payer: Self-pay | Source: Ambulatory Visit | Attending: Cardiology

## 2011-04-29 DIAGNOSIS — R079 Chest pain, unspecified: Secondary | ICD-10-CM

## 2011-04-29 DIAGNOSIS — I2510 Atherosclerotic heart disease of native coronary artery without angina pectoris: Secondary | ICD-10-CM | POA: Insufficient documentation

## 2011-04-29 DIAGNOSIS — Z9861 Coronary angioplasty status: Secondary | ICD-10-CM | POA: Insufficient documentation

## 2011-04-29 SURGERY — JV LEFT HEART CATHETERIZATION WITH CORONARY ANGIOGRAM
Anesthesia: Moderate Sedation

## 2011-04-29 MED ORDER — CLOPIDOGREL BISULFATE 75 MG PO TABS: 75.0000 mg | ORAL_TABLET | Freq: Every day | ORAL | Status: DC

## 2011-04-29 MED ORDER — SODIUM CHLORIDE 0.9 % IV SOLN: 1.0000 mL/kg/h | INTRAVENOUS | Status: DC

## 2011-04-29 MED ORDER — SODIUM CHLORIDE 0.9 % IV SOLN: INTRAVENOUS | Status: DC

## 2011-04-29 MED ORDER — ASPIRIN 81 MG PO CHEW: 324.0000 mg | CHEWABLE_TABLET | ORAL | Status: DC

## 2011-04-29 MED ORDER — ONDANSETRON HCL 4 MG/2ML IJ SOLN: 4.0000 mg | Freq: Four times a day (QID) | INTRAMUSCULAR | Status: DC | PRN

## 2011-04-29 MED ORDER — ACETAMINOPHEN 325 MG PO TABS: 650.0000 mg | ORAL_TABLET | ORAL | Status: DC | PRN

## 2011-04-29 NOTE — Op Note (Signed)
Cardiac Catheterization Procedure Note  Name: [name] MRN: [id number] DOB: [birth year]  Procedure: Left Heart Cath, Selective Coronary Angiography, LV angiography  Indication: 65 yo female status post stenting of the proximal LAD last month has had ongoing symptoms of chest pain and palpitations similar to her prior presentation.    Procedural details: The right groin was prepped, draped, and anesthetized with 1% lidocaine. Using modified Seldinger technique, a 4 French sheath was introduced into the right femoral artery. Standard Judkins catheters were used for coronary angiography and left ventriculography. Catheter exchanges were performed over a guidewire. There were no immediate procedural complications. The patient was transferred to the post catheterization recovery area for further monitoring.  Procedural Findings: Hemodynamics:  AO 134/63 LV 133/15   Coronary angiography: Coronary dominance: right  Left mainstem: Normal  Left anterior descending (LAD): Stent in the proximal vessel is widely patent. No significant disease  Left circumflex (LCx): Normal  Right coronary artery (RCA): 20% stenosis in the proximal vessel.  Left ventriculography: Left ventricular systolic function is normal, LVEF is estimated at 65%, there is no significant mitral regurgitation   Final Conclusions:    1. No significant obstructive coronary artery disease. Stent in the proximal LAD is widely patent.   2. Normal LV function.  Recommendations: Continue current medications. Will arrange an event monitor as an outpatient.  Blane Worthington [place] 04/29/2011, 11:42 AM

## 2011-04-29 NOTE — Telephone Encounter (Signed)
pt had cath today and states she was told no blocakges, however pt states since oct she has been on plavix and says she has had unusual sweating and thinks the plavix is causing this. advised that I would have one of the triage nurses call her. Pt asked to s/w Tereso Newcomer, PA I told pt he was already gone for the day. Danielle Rankin

## 2011-04-29 NOTE — OR Nursing (Signed)
Bedrest begins @ 1150.  Dr. [place] in to discuss results with patient and husband.  Dr. Elvis Coil office will contact patient re: a holter monitor for home.

## 2011-04-29 NOTE — Interval H&P Note (Signed)
History and Physical Interval Note:   04/29/2011   11:12 AM   [name]  has presented today for surgery, with the diagnosis of chest pain  The various methods of treatment have been discussed with the patient and family. After consideration of risks, benefits and other options for treatment, the patient has consented to  Procedure(s): JV LEFT HEART CATHETERIZATION WITH CORONARY ANGIOGRAM as a surgical intervention .  The patients' history has been reviewed, patient examined, no change in status, stable for surgery.  I have reviewed the patients' chart and labs.  Questions were answered to the patient's satisfaction.     Peter [place]  MD

## 2011-04-29 NOTE — Telephone Encounter (Signed)
New message:  Pt has a question about Plavix and the side effects.  Please call.

## 2011-04-29 NOTE — H&P (View-Only) (Signed)
History of Present Illness: PCP:  Dr. Janace Hoard  Primary Cardiologist:  Dr. Marca Ancona  [name] is a 65 y.o. female who was seen by Dr. Marca Ancona earlier this year for chest pain.  She refused stress testing in the past.  She was admitted to [facility] 10/20-10/23 with chest pain and ruled in for NSTEMI.  LHC demonstrated single vessel CAD with 95% mLAD and this was tx with a Promus DES.  EF was preserved. She was reluctant to start most medications.  She refused beta blocker due to h/o side effects in the past.  Importance of taking medication was discussed.  She presents for follow up.  She has a multitude of symptoms.  She notes continued chest pain that can come on at rest.  It is associated with rapid palpitations and diaphoresis.  She takes NTG with relief.  This was her symptom with her MI.  She denies any change since PCI.  Her husband thinks she was somewhat better at first.  She notes fatigue.  Her husband has to push her around at the store now in a wheelchair or scooter.  She tries to walk.  If she gets to about 30 minutes of walking she notes increased heart rate and chest pain.  No orthopnea, PND or edema.  No dyspnea.  No syncope.  She feels dizzy with the cozaar.  She also notes that her lips are chapped and  Bleeding.  No hemoptysis.    Past Medical History  Diagnosis Date  . GERD (gastroesophageal reflux disease)   . Hiatal hernia   . Arthritis   . HTN (hypertension)   . HLD (hyperlipidemia)   . CAD (coronary artery disease)     s/p NSTEMI 10/12: LHC 04/02/11: mLAD 95%, EF 60%; PCI: Promus DES to mLAD    Current Outpatient Prescriptions  Medication Sig Dispense Refill  . aspirin 81 MG chewable tablet Chew 81 mg by mouth daily.        [name] atorvastatin (LIPITOR) 40 MG tablet       . clopidogrel (PLAVIX) 75 MG tablet       . losartan (COZAAR) 50 MG tablet Take 50 mg by mouth daily.        [name] NITROSTAT 0.4 MG SL tablet As needed for chest pain every 5 minutes up to 3  times        Allergies: Allergies  Allergen Reactions  . Ibuprofen     REACTION: rapid heart rate    History  Substance Use Topics  . Smoking status: Never Smoker   . Smokeless tobacco: Not on file  . Alcohol Use: No     ROS:  Please see the history of present illness.   All other systems reviewed and negative.   Vital Signs: BP 130/64  Pulse 76  Ht 4\' 8"  (1.422 m)  Wt 95 lb (43.092 kg)  BMI 21.30 kg/m2  PHYSICAL EXAM: Well nourished, well developed, in no acute distress HEENT: normal Neck: no JVD Cardiac:  normal S1, S2; RRR; no murmur Lungs:  clear to auscultation bilaterally, no wheezing, rhonchi or rales Abd: soft, nontender, no hepatomegaly Ext: no edema; RFA site without hematoma or bruit Skin: warm and dry Neuro:  CNs 2-12 intact, no focal abnormalities noted Psych: normal affect  EKG:   NSR, HR 76, normal axis, NSSTTW changes, no significant change since prior tracing  ASSESSMENT AND PLAN:

## 2011-04-29 NOTE — Telephone Encounter (Signed)
SPOKE WITH PT. PT IS CONVINCED  GEN PLAVIX IS CAUSING SWEATING IS WANTING TO TRY THE BRAND NAME  TO SEE IF SYMPTOMS ARE ELIMINATED. BRAND NAME   PLAVIX  SENT VIA EPIC TO PHARMACY.

## 2011-04-29 NOTE — OR Nursing (Signed)
Discharge instructions reviewed / signed and pt stated understanding, ambulated in hall, voided without difficulty, site intact, no bleeding, discharged to husband's car via wheelchair.

## 2011-05-02 ENCOUNTER — Telehealth: Payer: Self-pay | Admitting: Physician Assistant

## 2011-05-02 ENCOUNTER — Other Ambulatory Visit: Payer: Self-pay

## 2011-05-02 ENCOUNTER — Ambulatory Visit (HOSPITAL_COMMUNITY): Payer: BC Managed Care – PPO

## 2011-05-02 MED ORDER — PRASUGREL HCL 10 MG PO TABS: 10.0000 mg | ORAL_TABLET | Freq: Every day | ORAL | Status: DC

## 2011-05-02 NOTE — Telephone Encounter (Signed)
Patient agreed to take effient 10 mgs daily.Advised to stop plavix when she starts effient.Effient was ordered [facility].

## 2011-05-02 NOTE — Telephone Encounter (Signed)
Patient was called,she states she cannot take Plavix.States makes her hot and sweaty and did not sleep well last night.,wants to try another blood thinner

## 2011-05-02 NOTE — Telephone Encounter (Signed)
She could take Effient 10 mg daily instead. She absolutely needs to take dual antiplatelet RX.  Theron Arista [place]

## 2011-05-02 NOTE — Telephone Encounter (Signed)
See other note issue resloved

## 2011-05-02 NOTE — Telephone Encounter (Signed)
Pt has questions regarding her plavix

## 2011-05-02 NOTE — Telephone Encounter (Signed)
Pt calling stating that plavix is not working and wants to try something else. Please return pt call to discuss further.

## 2011-05-02 NOTE — Telephone Encounter (Signed)
Pt states she feels like she is having a heart attack with SOB and hot. Told her we would let Dr [place] know she is already on brand name and says this brand is not working for her, She wants a different brand she said.

## 2011-05-03 ENCOUNTER — Telehealth: Payer: Self-pay | Admitting: Cardiology

## 2011-05-03 ENCOUNTER — Encounter (HOSPITAL_BASED_OUTPATIENT_CLINIC_OR_DEPARTMENT_OTHER): Payer: Self-pay

## 2011-05-03 ENCOUNTER — Telehealth: Payer: Self-pay | Admitting: *Deleted

## 2011-05-03 MED ORDER — PRASUGREL HCL 5 MG PO TABS: 5.0000 mg | ORAL_TABLET | Freq: Every day | ORAL | Status: DC

## 2011-05-03 NOTE — Telephone Encounter (Signed)
5 mg of effient is ok. Theron Arista [place]

## 2011-05-03 NOTE — Telephone Encounter (Signed)
Please advise, pt is 95 lb and 65 yrs old. She is on effient 10mg  and would like to know if she can take effient 5 mg instead due to her weight. Told pt Dr [place] will be in the office this afternoon and we will call her back before 5 pm. Forwarded to Deb gray RN/ nurse for him this afternoon.

## 2011-05-03 NOTE — Telephone Encounter (Signed)
[name], [name] Payor - patient refuse monitor << Less Detail     patient refuse monitor      [name]       Sent: Tue May 03, 2011 12:09 PM    To: Jacqlyn Krauss, RN    Cc: Marlowe Kays       [name]    MRN: [id number] DOB: [birth year]    Pt Work: [phone] Pt Home: [phone]           Message     Call patient for appointment for monitor and she said she donot need monitor now.  [name], [name] Payor - patient refuse monitor << Less Detail     patient refuse monitor      [name]       Sent: Tue May 03, 2011 12:09 PM    To: Jacqlyn Krauss, RN    Cc: Marlowe Kays       [name]    MRN: [id number] DOB: 05-14-1946    Pt Work: [phone] Pt Home: [phone]           Message     Call patient for appointment for monitor and she said she donot need monitor now.

## 2011-05-03 NOTE — Telephone Encounter (Signed)
Pt called and informed. She stated she already broke 10 mg tablet in half and took. Told her effient cant be broken and to pick up new strength for tomorrow. Pt verbalized understanding. Alfonso Ramus RN

## 2011-05-03 NOTE — Telephone Encounter (Signed)
Pt very concerned about her effient she wants to know if she can take 5mg  verses 10mg 

## 2011-05-04 ENCOUNTER — Telehealth: Payer: Self-pay | Admitting: Cardiology

## 2011-05-04 ENCOUNTER — Ambulatory Visit (HOSPITAL_COMMUNITY): Payer: BC Managed Care – PPO

## 2011-05-04 NOTE — Telephone Encounter (Signed)
New message:  Pt called and stated that Effient is not working for her.  Please call her and advise what she should do.  Has multiple symptoms, sweating, not sleeping, the same as Plavix did.

## 2011-05-04 NOTE — Telephone Encounter (Signed)
Patient called stating she cannot take Effient .States ,causes same problem as plavix,hot feeling can't sleep,depression.Patient crying very upset.Checked with Lawson Fiscal advised to take either Plavix or Effient Called patient she was told she had to take either Plavix or Effient. Patient starts crying very upset. I tried to reassure patient,that she has to take Plavix or Effient.

## 2011-05-08 NOTE — Telephone Encounter (Signed)
Agree, patient must take dual antiplatelet therapy with recent DES to LAD. I do not think her symptoms are related to this. Should see her primary care physician to look for other causes. Thedora Hinders

## 2011-05-09 ENCOUNTER — Ambulatory Visit (HOSPITAL_COMMUNITY): Payer: BC Managed Care – PPO

## 2011-05-09 NOTE — Telephone Encounter (Signed)
Patient called ,stated she is taking generic plavix.Stated she is not having any problems.Advised to continue.

## 2011-05-11 ENCOUNTER — Ambulatory Visit (HOSPITAL_COMMUNITY): Payer: BC Managed Care – PPO

## 2011-05-13 ENCOUNTER — Ambulatory Visit (HOSPITAL_COMMUNITY): Payer: BC Managed Care – PPO

## 2011-05-16 ENCOUNTER — Encounter: Payer: BC Managed Care – PPO | Admitting: Physician Assistant

## 2011-05-16 ENCOUNTER — Ambulatory Visit (HOSPITAL_COMMUNITY): Payer: BC Managed Care – PPO

## 2011-05-18 ENCOUNTER — Ambulatory Visit (HOSPITAL_COMMUNITY): Payer: BC Managed Care – PPO

## 2011-05-20 ENCOUNTER — Ambulatory Visit (HOSPITAL_COMMUNITY): Payer: BC Managed Care – PPO

## 2011-05-23 ENCOUNTER — Ambulatory Visit (HOSPITAL_COMMUNITY): Payer: BC Managed Care – PPO

## 2011-05-25 ENCOUNTER — Ambulatory Visit (INDEPENDENT_AMBULATORY_CARE_PROVIDER_SITE_OTHER): Payer: BC Managed Care – PPO | Admitting: Physician Assistant

## 2011-05-25 ENCOUNTER — Ambulatory Visit (HOSPITAL_COMMUNITY): Payer: BC Managed Care – PPO

## 2011-05-25 ENCOUNTER — Encounter: Payer: Self-pay | Admitting: Physician Assistant

## 2011-05-25 DIAGNOSIS — I2510 Atherosclerotic heart disease of native coronary artery without angina pectoris: Secondary | ICD-10-CM

## 2011-05-25 DIAGNOSIS — R002 Palpitations: Secondary | ICD-10-CM

## 2011-05-25 DIAGNOSIS — E785 Hyperlipidemia, unspecified: Secondary | ICD-10-CM

## 2011-05-25 DIAGNOSIS — I10 Essential (primary) hypertension: Secondary | ICD-10-CM

## 2011-05-25 NOTE — Assessment & Plan Note (Signed)
She is doing well.  We discussed the importance of dual antiplatelet therapy.  She will continue on aspirin and Plavix.  She was also seen by Dr. Marca Ancona today.  He recommended cardiac rehabilitation.  She prefers to defer this for now.  She is currently having no angina.  Followup with Dr. Marca Ancona in 3 months.

## 2011-05-25 NOTE — Assessment & Plan Note (Signed)
No recurrence.  She deferred having a monitor placed.  We will hold off on proceeding with further evaluation at this time.

## 2011-05-25 NOTE — Patient Instructions (Addendum)
Your physician recommends that you schedule a follow-up appointment in: 3-4 MONTHS WITH DR. Shirlee Latch PER DR. Shirlee Latch  HOLD LIPITOR FOR 1 WEEK AND IF YOUR MUSCLE ACHES ARE BETTER THEN START ON CRESTOR 5 DAILY; IF THE MUSCLE ACHES ARE NOT ANY BETTER THEN JUST GO BACK ON THE LIPITOR  Your physician recommends that you return for lab work in: 6-8 WEEKS FOR FASTING LIPID/LIVER PANEL 272.4 PANEL HYPERLIPIDEMIA

## 2011-05-25 NOTE — Progress Notes (Addendum)
[address] [place], [place]  [address] Phone: [phone] Fax:  [phone]  Date:  05/25/2011   Name:  [name]       DOB:  05/21/1946 MRN:  [id number]  PCP:  Dr. Cleta Alberts Primary Cardiologist:  Dr. Marca Ancona    History of Present Illness: [name] is a 65 y.o. female who presents for follow up.  She saw Dr. Marca Ancona earlier this year for chest pain. She refused stress testing in the past. She was admitted to [facility] 03/2011 with a NSTEMI. LHC demonstrated single vessel CAD with 95% mLAD and this was tx with a Promus DES. EF was preserved. She was reluctant to start most medications. She refused beta blocker due to h/o side effects in the past. Importance of taking medication was discussed. I saw her in followup 11/13.  She continued to note chest pain.  She also noted palpitations.  She complained of significant fatigue and was actually riding a scooter around in a grocery store.  We set her up for a relook heart catheterization 11/16: LAD stent patent, proximal RCA 25%, EF 65%.  She requested that her Plavix be changed due to side effects.  She took Effient for a few days and then went back to Plavix due to side effects.  She seems to be describing significant dyspepsia.  She denies recurrent chest pain.  She denies dyspnea, orthopnea, PND, edema, palpitations.  Of note, we had set her up for an event monitor.  However she refused to have this done.  She notes diffuse joint pains and attributes this to her Lipitor.  Labs from her PCP dated 04/14/11: Potassium 4.2, creatinine 0.2, TC 112, TG 112, HDL 49, LDL 41.  Past Medical History  Diagnosis Date  . GERD (gastroesophageal reflux disease)   . Hiatal hernia   . Arthritis   . HTN (hypertension)   . HLD (hyperlipidemia)   . CAD (coronary artery disease)     s/p NSTEMI 10/12: LHC 04/02/11: mLAD 95%, EF 60%; PCI: Promus DES to mLAD; LHC 04/29/11:   LAD stent patent, proximal RCA 25%, EF  65%     Current Outpatient Prescriptions  Medication Sig Dispense Refill  . aspirin 81 MG chewable tablet Chew 81 mg by mouth daily.        [name] atorvastatin (LIPITOR) 40 MG tablet       . losartan (COZAAR) 50 MG tablet Take 50 mg by mouth daily.        [name] NITROSTAT 0.4 MG SL tablet As needed for chest pain every 5 minutes up to 3 times      . PLAVIX 75 MG tablet Take 75 mg by mouth daily.         Allergies: Allergies  Allergen Reactions  . Ibuprofen     REACTION: rapid heart rate    History  Substance Use Topics  . Smoking status: Never Smoker   . Smokeless tobacco: Not on file  . Alcohol Use: No     ROS:  Please see the history of present illness.   All other systems reviewed and negative.   PHYSICAL EXAM: VS:  BP 128/66  Pulse 69  Resp 18  Ht 4\' 8"  (1.422 m)  Wt 96 lb 1.9 oz (43.6 kg)  BMI 21.55 kg/m2 Well nourished, well developed, in no acute distress HEENT: normal Neck: no JVD Cardiac:  normal S1, S2; RRR; no murmur Lungs:  clear to auscultation bilaterally, no wheezing,  rhonchi or rales Abd: soft, nontender, no hepatomegaly Ext: no edema; Right femoral arteriotomy site without hematoma or bruit Skin: warm and dry Neuro:  CNs 2-12 intact, no focal abnormalities noted  EKG:   Sinus rhythm, heart rate 69, normal axis, nonspecific ST-T wave changes  ASSESSMENT AND PLAN:

## 2011-05-25 NOTE — Assessment & Plan Note (Signed)
Controlled.  

## 2011-05-25 NOTE — Assessment & Plan Note (Signed)
She is having some joint aches.  I have suggested that she hold off on taking the Lipitor for a week.  If this helps resolve her symptoms she can try Crestor 5 mg daily.  She will need lipids and LFTs in 6-8 weeks.

## 2011-05-27 ENCOUNTER — Ambulatory Visit (HOSPITAL_COMMUNITY): Payer: BC Managed Care – PPO

## 2011-05-30 ENCOUNTER — Ambulatory Visit (HOSPITAL_COMMUNITY): Payer: BC Managed Care – PPO

## 2011-06-01 ENCOUNTER — Ambulatory Visit (HOSPITAL_COMMUNITY): Payer: BC Managed Care – PPO

## 2011-06-03 ENCOUNTER — Ambulatory Visit (HOSPITAL_COMMUNITY): Payer: BC Managed Care – PPO

## 2011-06-08 ENCOUNTER — Ambulatory Visit (HOSPITAL_COMMUNITY): Payer: BC Managed Care – PPO

## 2011-06-10 ENCOUNTER — Ambulatory Visit (HOSPITAL_COMMUNITY): Payer: BC Managed Care – PPO

## 2011-06-13 ENCOUNTER — Ambulatory Visit (HOSPITAL_COMMUNITY): Payer: BC Managed Care – PPO

## 2011-06-15 ENCOUNTER — Ambulatory Visit (HOSPITAL_COMMUNITY): Payer: BC Managed Care – PPO

## 2011-06-17 ENCOUNTER — Ambulatory Visit (HOSPITAL_COMMUNITY): Payer: BC Managed Care – PPO

## 2011-06-20 ENCOUNTER — Ambulatory Visit (HOSPITAL_COMMUNITY): Payer: BC Managed Care – PPO

## 2011-06-22 ENCOUNTER — Ambulatory Visit (HOSPITAL_COMMUNITY): Payer: BC Managed Care – PPO

## 2011-06-24 ENCOUNTER — Ambulatory Visit (HOSPITAL_COMMUNITY): Payer: BC Managed Care – PPO

## 2011-06-27 ENCOUNTER — Ambulatory Visit (HOSPITAL_COMMUNITY): Payer: BC Managed Care – PPO

## 2011-06-29 ENCOUNTER — Ambulatory Visit (HOSPITAL_COMMUNITY): Payer: BC Managed Care – PPO

## 2011-07-01 ENCOUNTER — Ambulatory Visit (HOSPITAL_COMMUNITY): Payer: BC Managed Care – PPO

## 2011-07-13 ENCOUNTER — Ambulatory Visit (HOSPITAL_COMMUNITY): Payer: BC Managed Care – PPO

## 2011-07-25 ENCOUNTER — Ambulatory Visit (INDEPENDENT_AMBULATORY_CARE_PROVIDER_SITE_OTHER): Payer: BC Managed Care – PPO | Admitting: Internal Medicine

## 2011-07-25 ENCOUNTER — Encounter: Payer: Self-pay | Admitting: Internal Medicine

## 2011-07-25 DIAGNOSIS — I2510 Atherosclerotic heart disease of native coronary artery without angina pectoris: Secondary | ICD-10-CM

## 2011-07-25 DIAGNOSIS — E7800 Pure hypercholesterolemia, unspecified: Secondary | ICD-10-CM

## 2011-07-25 DIAGNOSIS — R739 Hyperglycemia, unspecified: Secondary | ICD-10-CM

## 2011-07-25 DIAGNOSIS — E785 Hyperlipidemia, unspecified: Secondary | ICD-10-CM

## 2011-07-25 DIAGNOSIS — R7309 Other abnormal glucose: Secondary | ICD-10-CM

## 2011-07-25 DIAGNOSIS — I10 Essential (primary) hypertension: Secondary | ICD-10-CM

## 2011-07-25 LAB — COMPREHENSIVE METABOLIC PANEL
ALT: 59 U/L — ABNORMAL HIGH (ref 0–35)
AST: 34 U/L (ref 0–37)
Albumin: 4.4 g/dL (ref 3.5–5.2)
Alkaline Phosphatase: 55 U/L (ref 39–117)
Glucose, Bld: 107 mg/dL — ABNORMAL HIGH (ref 70–99)
Potassium: 4.1 mEq/L (ref 3.5–5.3)
Sodium: 142 mEq/L (ref 135–145)
Total Protein: 7.2 g/dL (ref 6.0–8.3)

## 2011-07-25 LAB — POCT CBC
Granulocyte percent: 63.6 %G (ref 37–80)
HCT, POC: 40.4 % (ref 37.7–47.9)
MPV: 9.3 fL (ref 0–99.8)
POC Granulocyte: 3.6 (ref 2–6.9)
POC LYMPH PERCENT: 31.8 %L (ref 10–50)
Platelet Count, POC: 407 10*3/uL (ref 142–424)
RDW, POC: 12.6 %

## 2011-07-25 LAB — POCT GLYCOSYLATED HEMOGLOBIN (HGB A1C): Hemoglobin A1C: 5.6

## 2011-07-25 LAB — LIPID PANEL: LDL Cholesterol: 38 mg/dL (ref 0–99)

## 2011-07-25 NOTE — Progress Notes (Signed)
  Subjective:    Patient ID: [name], female    DOB: 06-20-1945, 66 y.o.   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 Her cardiologist is Dr. Shirlee Latch. She has done very well and feels a sense of her new energy. In the past 6 months some glucose levels have been borderline high. She also needs a recheck of her lipids.    Review of Systemsthere is no weight loss or night sweats. Fatigue negative. There is no shortness of breath. She has no chest pain or palpitations. GI complaints she had have been resolved following a gastroenterology evaluation.     Objective:   Physical Examvital signs are normal today HEENT is clear The heart is regular without murmurs rubs gallops or clicks The lungs are clear There is no peripheral edema        Assessment & Plan:  Problem #1 coronary artery disease status post stent Problem #2 hyperlipidemia Problem #3 history of hyperglycemia Problem #4 hypertension  problem #5 history of anxiety   Her hemoglobin A1c today is normal. She does not need any refills of medicines at this point. Her laboratory results will be sent to Dr. Shirlee Latch

## 2011-07-28 ENCOUNTER — Telehealth: Payer: Self-pay

## 2011-07-28 NOTE — Telephone Encounter (Signed)
Spoke with patient, advised her I would mail a copy of her labs to her.  Patient states understanding

## 2011-07-28 NOTE — Telephone Encounter (Signed)
Pt is also requesting we mail her the lab results after they are explained to her via phone.

## 2011-07-28 NOTE — Telephone Encounter (Signed)
Pt is calling for her lab results from last Monday

## 2011-08-02 ENCOUNTER — Other Ambulatory Visit: Payer: Self-pay | Admitting: Cardiology

## 2011-08-02 MED ORDER — ATORVASTATIN CALCIUM 40 MG PO TABS: 40.0000 mg | ORAL_TABLET | Freq: Every day | ORAL | Status: DC

## 2011-08-02 MED ORDER — CLOPIDOGREL BISULFATE 75 MG PO TABS: 75.0000 mg | ORAL_TABLET | Freq: Every day | ORAL | Status: DC

## 2011-08-02 MED ORDER — LOSARTAN POTASSIUM 50 MG PO TABS: 50.0000 mg | ORAL_TABLET | Freq: Every day | ORAL | Status: DC

## 2011-08-04 ENCOUNTER — Ambulatory Visit (INDEPENDENT_AMBULATORY_CARE_PROVIDER_SITE_OTHER): Payer: BC Managed Care – PPO | Admitting: Family Medicine

## 2011-08-04 DIAGNOSIS — F411 Generalized anxiety disorder: Secondary | ICD-10-CM

## 2011-08-04 DIAGNOSIS — F419 Anxiety disorder, unspecified: Secondary | ICD-10-CM

## 2011-08-04 DIAGNOSIS — R7401 Elevation of levels of liver transaminase levels: Secondary | ICD-10-CM

## 2011-08-04 DIAGNOSIS — R748 Abnormal levels of other serum enzymes: Secondary | ICD-10-CM

## 2011-08-04 NOTE — Patient Instructions (Signed)
He will repeat the liver enzymes today and let you know what they reveal. We will repeat them again in one month. In the meanwhile,cut your cholesterol pill in half to 20 mg.

## 2011-08-04 NOTE — Progress Notes (Signed)
  Subjective:    Patient ID: [name], female    DOB: Mar 07, 1946, 66 y.o.   MRN: [id number]  HPI Patient is very anxious because her liver enzymes came back mildly elevated. She has been reading up on this and is worried about it. No other symptoms.   Review of Systems     Objective:   Physical Exam  Anxious. Alert oriented. Abdomen soft without Hsm   Assessment & Plan:  Elevated liver enzymes, possibly related to Lipitor Anxiety  Repeat enzymes in one month.

## 2011-08-05 ENCOUNTER — Encounter: Payer: Self-pay | Admitting: Family Medicine

## 2011-08-05 ENCOUNTER — Telehealth: Payer: Self-pay | Admitting: Cardiology

## 2011-08-05 DIAGNOSIS — E785 Hyperlipidemia, unspecified: Secondary | ICD-10-CM

## 2011-08-05 LAB — HEPATIC FUNCTION PANEL
Bilirubin, Direct: 0.1 mg/dL (ref 0.0–0.3)
Indirect Bilirubin: 0.4 mg/dL (ref 0.0–0.9)
Total Bilirubin: 0.5 mg/dL (ref 0.3–1.2)

## 2011-08-05 NOTE — Telephone Encounter (Signed)
No answer at home.  Unable to leave message. Debbie Danylle Ouk RN  

## 2011-08-05 NOTE — Telephone Encounter (Signed)
New msg Pt wants to talk to you about lipitor please call

## 2011-08-05 NOTE — Telephone Encounter (Signed)
Fu call Patient calling back again 

## 2011-08-08 ENCOUNTER — Telehealth: Payer: Self-pay

## 2011-08-08 NOTE — Telephone Encounter (Signed)
OK to continue atorvastatin 20 for now, repeat LFTs 2 weeks as mentioned before.

## 2011-08-08 NOTE — Telephone Encounter (Signed)
Patient called wanting to know results of hepatic panel done on 08/04/11.Patient was told results not available,will call back when reviewed by Dr. Shirlee Latch.

## 2011-08-08 NOTE — Telephone Encounter (Signed)
AST/ALT are mildly elevated.  Is she taking Crestor 5 mg daily? If so, would repeat LFTs in 2 weeks to see if they continue to rise or stabilize.  Would continue statin if rise is < 3 x upper limit normal.

## 2011-08-08 NOTE — Telephone Encounter (Signed)
Patient called advised to continue atorvastatin 20 mg daily.Advised to repeat LFTs in 2 weeks.

## 2011-08-08 NOTE — Telephone Encounter (Signed)
Patient called stated she had to stop crestor due to muscle pain.States she has been taking Atorvastatin 20 mg daily for 2 months.Will forward to Dr.Mclean for advise.

## 2011-08-08 NOTE — Telephone Encounter (Signed)
Patient would like for Dr to contact her,. She is wanting her results from her blood work.  She also would like to pick-up the copy of blood work results.[name]

## 2011-08-09 ENCOUNTER — Telehealth: Payer: Self-pay

## 2011-08-09 ENCOUNTER — Telehealth: Payer: Self-pay | Admitting: Cardiology

## 2011-08-09 NOTE — Telephone Encounter (Signed)
LFTs have elevated slightly.  I will have Dr. Alwyn Ren call pt to discuss follow up.

## 2011-08-09 NOTE — Telephone Encounter (Signed)
Line busy

## 2011-08-09 NOTE — Telephone Encounter (Signed)
New msg Pt wants discuss lab results

## 2011-08-09 NOTE — Telephone Encounter (Signed)
Phone busy no answer at this time.

## 2011-08-09 NOTE — Telephone Encounter (Signed)
.  [facility]     PT REQUESTING CONSULT RE LABS SHE RECEVIED IN [facility]   BEST PHONE [phone]

## 2011-08-09 NOTE — Telephone Encounter (Signed)
Pt would like to know if she can d/c Lipitor, (she has already d/c  5 days ago) and recheck labs  in one month.   She is concerned about this medication affecting her liver.

## 2011-08-09 NOTE — Telephone Encounter (Signed)
Addressed her previous message regarding getting results.  Have her continue to hold Lipitor until discusess with Dr. Alwyn Ren.

## 2011-08-10 NOTE — Telephone Encounter (Signed)
Discussed with pt. She is aware of Dr Alford Highland recommendation to take atorvastatin and check LFTs in 2 weeks. Pt declined to do this. She will diet and exercise.

## 2011-08-10 NOTE — Telephone Encounter (Signed)
Copy of labs in drawer for p/up, Dr Alwyn Ren to call pt

## 2011-08-10 NOTE — Telephone Encounter (Signed)
Pt states that AST/ALT are elevated and she wants to D/C her statin and use diet and exercise only.  Please advise.

## 2011-08-10 NOTE — Telephone Encounter (Signed)
Risk of having heart attack off statin is much greater than risk of having severe liver problems on statin with very mild transaminase increase.  She was on atorvastatin 40 in the past without LFT elevation.  Would just have her go back to that med and check LFTs in about 2 wks after starting it.

## 2011-08-11 ENCOUNTER — Ambulatory Visit (INDEPENDENT_AMBULATORY_CARE_PROVIDER_SITE_OTHER): Payer: BC Managed Care – PPO | Admitting: Family Medicine

## 2011-08-11 VITALS — BP 132/66 | HR 82 | Temp 97.7°F | Resp 16 | Ht <= 58 in | Wt 96.0 lb

## 2011-08-11 DIAGNOSIS — F419 Anxiety disorder, unspecified: Secondary | ICD-10-CM

## 2011-08-11 DIAGNOSIS — E785 Hyperlipidemia, unspecified: Secondary | ICD-10-CM

## 2011-08-11 DIAGNOSIS — R748 Abnormal levels of other serum enzymes: Secondary | ICD-10-CM

## 2011-08-11 DIAGNOSIS — R7401 Elevation of levels of liver transaminase levels: Secondary | ICD-10-CM

## 2011-08-11 LAB — LIPID PANEL
Cholesterol: 162 mg/dL (ref 0–200)
Total CHOL/HDL Ratio: 2.7 Ratio

## 2011-08-11 NOTE — Progress Notes (Signed)
Subjective: Patient continues to worry quite a bit about her elevated liver enzymes. This time both the SGOT and SGPT were little elevated. She would like to get off of her statin. I advised her to cut the dose in half, but she has not given any time yet. We talked about it and said that it was probably okay to go ahead and cut off the statin for now. I told her it is the best thing for keeping cholesterols down. She was placed on it after having a heart problem.  It has been 2 years since [place] was in [place] No history of hepatitis  Objective Alert, oriented. No acute distress. Anxious. Chest clear. Heart regular without murmurs.

## 2011-08-11 NOTE — Patient Instructions (Signed)
Return in about 2 weeks first to recheck your blood count.

## 2011-08-11 NOTE — Progress Notes (Signed)
  Subjective:    Patient ID: [name], female    DOB: 1946-04-13, 66 y.o.   MRN: [id number]  HPI    Review of Systems     Objective:   Physical Exam        Assessment & Plan:

## 2011-08-11 NOTE — Telephone Encounter (Signed)
Gave pt instructions from [place], pt agreed

## 2011-08-12 ENCOUNTER — Telehealth: Payer: Self-pay

## 2011-08-12 LAB — HEPATITIS B SURFACE ANTIBODY, QUANTITATIVE: Hepatitis B-Post: 0.1 m[IU]/mL

## 2011-08-12 LAB — HEPATITIS B SURFACE ANTIGEN: Hepatitis B Surface Ag: POSITIVE — AB

## 2011-08-12 LAB — HEPATITIS C ANTIBODY: HCV Ab: NEGATIVE

## 2011-08-12 NOTE — Telephone Encounter (Signed)
Spoke with patient and patient just requested most recent labs in February be mailed. Verified address and mailed copy.

## 2011-08-12 NOTE — Telephone Encounter (Signed)
Pt is requesting her lab results for the past six months to be discussed with her.

## 2011-08-12 NOTE — Telephone Encounter (Signed)
Spoke with patient and patient requested a copy of labs mailed to her. Copy mailed to address verified.

## 2011-08-15 ENCOUNTER — Encounter: Payer: Self-pay | Admitting: Family Medicine

## 2011-08-17 ENCOUNTER — Telehealth: Payer: Self-pay

## 2011-08-17 NOTE — Telephone Encounter (Signed)
Dr. Alwyn Ren, did you review these?  I do not see any result notes for this.  Pls advise and route to clinical pool.

## 2011-08-17 NOTE — Telephone Encounter (Signed)
PATIENT WOULD LIKE TO KNOW IF HER LAB RESULTS ARE BACK FROM 08-11-11.  PLEASE CALL HER TO LET HER KNOW, BUT SHE ALSO WOULD LIKE TO PICK UP A COPY OF THEM.

## 2011-08-22 ENCOUNTER — Encounter (HOSPITAL_COMMUNITY)
Admission: RE | Admit: 2011-08-22 | Discharge: 2011-08-22 | Payer: BC Managed Care – PPO | Source: Ambulatory Visit | Attending: Cardiology | Admitting: Cardiology

## 2011-08-22 ENCOUNTER — Telehealth: Payer: Self-pay

## 2011-08-22 ENCOUNTER — Other Ambulatory Visit: Payer: Self-pay

## 2011-08-22 NOTE — Telephone Encounter (Signed)
.  [facility] [employer] FROM THE [facility] HEALTH DEPT WOULD LIKE TO SPEAK WITH SOMEONE REGARDING THIS PT'S DIAGNOSIS PLEASE CALL [phone]

## 2011-08-22 NOTE — Telephone Encounter (Signed)
Forwarding this phone message to lab so that fax of Hep panel and CMP can be documented.

## 2011-08-22 NOTE — Progress Notes (Signed)
1430- Pt came in for first day of exercise, but was too tired after her walk in from the garage. Pt signed orientation paperwork and was given an overview of the program. Pt plans to begin exercise on Wednesday this week.

## 2011-08-23 ENCOUNTER — Telehealth: Payer: Self-pay

## 2011-08-23 NOTE — Telephone Encounter (Signed)
.  [facility] PT WAS SEEN FOR AN ILLNESS AND WOULD LIKE TO KNOW IF SHE IS CONTAGIOUS. PLEASE CALL [phone]

## 2011-08-23 NOTE — Telephone Encounter (Signed)
Spoke with pt advised she is not contagious per dr hopper. Pt understood

## 2011-08-24 ENCOUNTER — Telehealth: Payer: Self-pay

## 2011-08-24 ENCOUNTER — Encounter (HOSPITAL_COMMUNITY): Payer: BC Managed Care – PPO

## 2011-08-24 ENCOUNTER — Telehealth: Payer: Self-pay | Admitting: Gastroenterology

## 2011-08-24 NOTE — Telephone Encounter (Signed)
Patient was seen by primary care MD at Urgent care and she wants an office visit for the elevated LFT.  She is offered an appt for 09/26/11.  She refuses this appt she feels it is too far away. She states that she will have her cardiologist review.  I have advised her to take the appt and that if he is able to answer all of her questions she can cancel, but she will most likely need an appt with GI.  She wants to wait until she sees cardiology to see if she needs an appt.  She is asked to call back if she changes her mind.

## 2011-08-24 NOTE — Telephone Encounter (Signed)
.  [facility] BETTY FOSTER WITH THE [facility] DEPT STATES THEY ONLY RECEIVED THE LABS ON PT, BUT NOT THE PROVIDER RECORDS PLEASE FAX TO [phone] AND THE PHONE NUMBER IS [phone]

## 2011-08-24 NOTE — Telephone Encounter (Signed)
Pt received a call from the health department regarding her positive HEP B She thought this was confidential information and is upset we shared this information.  Please call pt at [phone]

## 2011-08-25 ENCOUNTER — Telehealth: Payer: Self-pay | Admitting: Cardiology

## 2011-08-25 NOTE — Telephone Encounter (Signed)
New Msg: Pt calling wanting to speak with nurse/MD about pt stopping taking aspirin. Please return pt call to discuss further.

## 2011-08-25 NOTE — Telephone Encounter (Signed)
Office notes faxed to [facility] by Hansel Starling via [employer].

## 2011-08-25 NOTE — Telephone Encounter (Signed)
Reviewed with Dr Dallas Schimke recommendation is to continue aspirin. I have talked with pt and she is aware of his recommendation.

## 2011-08-26 ENCOUNTER — Encounter (HOSPITAL_COMMUNITY): Payer: BC Managed Care – PPO

## 2011-08-29 ENCOUNTER — Encounter (HOSPITAL_COMMUNITY): Payer: BC Managed Care – PPO

## 2011-08-31 ENCOUNTER — Encounter (HOSPITAL_COMMUNITY): Payer: BC Managed Care – PPO

## 2011-09-02 ENCOUNTER — Encounter (HOSPITAL_COMMUNITY): Payer: BC Managed Care – PPO

## 2011-09-05 ENCOUNTER — Encounter (HOSPITAL_COMMUNITY): Payer: BC Managed Care – PPO

## 2011-09-07 ENCOUNTER — Encounter (HOSPITAL_COMMUNITY): Payer: BC Managed Care – PPO

## 2011-09-08 ENCOUNTER — Ambulatory Visit (INDEPENDENT_AMBULATORY_CARE_PROVIDER_SITE_OTHER): Payer: Self-pay | Admitting: Cardiology

## 2011-09-08 ENCOUNTER — Telehealth: Payer: Self-pay | Admitting: *Deleted

## 2011-09-08 ENCOUNTER — Encounter: Payer: Self-pay | Admitting: Cardiology

## 2011-09-08 VITALS — BP 130/60 | HR 80 | Ht <= 58 in | Wt 97.0 lb

## 2011-09-08 DIAGNOSIS — R0989 Other specified symptoms and signs involving the circulatory and respiratory systems: Secondary | ICD-10-CM

## 2011-09-08 DIAGNOSIS — R7989 Other specified abnormal findings of blood chemistry: Secondary | ICD-10-CM

## 2011-09-08 DIAGNOSIS — I6529 Occlusion and stenosis of unspecified carotid artery: Secondary | ICD-10-CM

## 2011-09-08 DIAGNOSIS — I2510 Atherosclerotic heart disease of native coronary artery without angina pectoris: Secondary | ICD-10-CM

## 2011-09-08 DIAGNOSIS — E785 Hyperlipidemia, unspecified: Secondary | ICD-10-CM

## 2011-09-08 DIAGNOSIS — R002 Palpitations: Secondary | ICD-10-CM

## 2011-09-08 MED ORDER — ROSUVASTATIN CALCIUM 5 MG PO TABS: ORAL_TABLET | ORAL | Status: DC

## 2011-09-08 NOTE — Telephone Encounter (Signed)
[facility], [place], [place] - LAB/OV More Detail >>      LAB/OV      Connye Burkitt, NT        Sent: Thu September 08, 2011  5:05 PM    To: Jacqlyn Krauss, RN        [name]    MRN: [id number] DOB: September 24, 1945     Pt Home: [phone]               Message     [name] has refused to make a follow-up appointment for June with Mclean and fasting labs for May. Patient stated her insurance will run out the end of April.

## 2011-09-08 NOTE — Patient Instructions (Addendum)
Take Crestor 5mg  every other day.  Dr Shirlee Latch has referred you to Dr Russella Dar for evaluation for possible Hepatits B.  Your physician recommends that you return for lab work in: 1 month--liver profile  Your physician recommends that you return for a FASTING lipid profile /liver profile in 2 months.   Your physician has requested that you have a carotid duplex. This test is an ultrasound of the carotid arteries in your neck. It looks at blood flow through these arteries that supply the brain with blood. Allow one hour for this exam. There are no restrictions or special instructions. This should be done before May 2013.   Dr Shirlee Latch has recommended participation in Cardiac Rehab.  Your physician recommends that you schedule a follow-up appointment in: 3 months with Dr Shirlee Latch.

## 2011-09-09 ENCOUNTER — Encounter (HOSPITAL_COMMUNITY): Payer: BC Managed Care – PPO

## 2011-09-11 DIAGNOSIS — R0989 Other specified symptoms and signs involving the circulatory and respiratory systems: Secondary | ICD-10-CM | POA: Insufficient documentation

## 2011-09-11 NOTE — Assessment & Plan Note (Signed)
No exertional chest pain or dyspnea.  Continue ASA, Plavix, losartan.  LFTs are only mildly elevated. I will have her carefully resume a statin, Crestor 5 mg qod.  I think that the elevated LFTs are due to HBV.  I will get LFTs in 1 month and lipids/LFTs in 2 months.

## 2011-09-11 NOTE — Assessment & Plan Note (Signed)
Bilateral carotid bruits.  Will get carotid dopplers.  

## 2011-09-11 NOTE — Assessment & Plan Note (Signed)
Starting Crestor 5 mg qod, as above.  Goal LDL < 70.

## 2011-09-11 NOTE — Assessment & Plan Note (Signed)
Patient continues to have episodes of palpitations.  She continues to refuse event monitor.  No syncope.  She has not tolerated beta blockers.

## 2011-09-11 NOTE — Progress Notes (Signed)
[address] [place], [place]  [address] Phone: [phone] Fax:  [phone]  Date:  09/11/2011   Name:  [name]       DOB:  1946/04/06 MRN:  [id number]  PCP:  Dr. Cleta Alberts   History of Present Illness: [name] is a 66 y.o. female who presents for follow up.  She saw me early last year for chest pain. She refused stress testing in the past. She was admitted to [facility] 03/2011 with a NSTEMI. LHC demonstrated single vessel CAD with 95% mLAD and this was tx with a Promus DES. EF was preserved. She was reluctant to start most medications. She refused beta blocker due to h/o side effects in the past. She was seen in followup 11/13.  She continued to note chest pain.  She also noted palpitations.  She complained of significant fatigue and was actually riding a scooter around in a grocery store.  We set her up for a relook heart catheterization 11/16: LAD stent patent, proximal RCA 25%, EF 65%.  She requested that her Plavix be changed due to side effects.  She took Effient for a few days and then went back to Plavix due to side effects.    Recently, her LFTs were mildly elevated so she stopped her statin.  She was actually found recently to be positive for HBsAg and negative for HBsAb which is suggestive of active HBV. She continues to have episodes of elevated heart rate/palpitations with associated diaphoresis and chest pain (about twice a month).   Besides these episodes, she denies chest pain or exertional dyspnea.   Labs (11/12): Potassium 4.2, creatinine 0.2, TC 112, TG 112, HDL 49, LDL 41. Labs (2/13): K 4.1, creatinine 0.73, LDL 84, HDL 59, HBsAB negative, HBsAg positive, HCV negative, AST 43, ALT 67  PMH: 1. GERD with hiatal hernia 2. HBV 3. HTN 4. Hyperlipidemia 5. CAD: NSTEMI 10/12 with 95% mLAD stenosis tx with Promus DES.  EF 60% on LV-gram.  Relook cath in 11/12 with patent LAD stent, EF 65%.   6. Medical noncompliance.   Current  Outpatient Prescriptions  Medication Sig Dispense Refill  . clopidogrel (PLAVIX) 75 MG tablet Take 1 tablet (75 mg total) by mouth daily.  30 tablet  6  . losartan (COZAAR) 50 MG tablet Take 1 tablet (50 mg total) by mouth daily.  30 tablet  6  . NITROSTAT 0.4 MG SL tablet As needed for chest pain every 5 minutes up to 3 times      . aspirin 81 MG chewable tablet Chew 81 mg by mouth daily.        . rosuvastatin (CRESTOR) 5 MG tablet Take one every other day  15 tablet  6    Allergies: Allergies  Allergen Reactions  . Ibuprofen     REACTION: rapid heart rate    History  Substance Use Topics  . Smoking status: Never Smoker   . Smokeless tobacco: Not on file  . Alcohol Use: No   Originally from [place]   FH: NO premature CAD  ROS:  Please see the history of present illness.   All other systems reviewed and negative.   PHYSICAL EXAM: VS:  BP 130/60  Pulse 80  Ht 4\' 8"  (1.422 m)  Wt 97 lb (43.999 kg)  BMI 21.75 kg/m2 Well nourished, well developed, in no acute distress HEENT: normal Neck: no JVD Cardiac:  normal S1, S2; RRR; 1/6 SEM.  Bilateral carotid bruits.  Lungs:  clear to auscultation bilaterally, no wheezing, rhonchi or rales Abd: soft, nontender, no hepatomegaly Ext: no edema; Right femoral arteriotomy site without hematoma or bruit Skin: warm and dry

## 2011-09-11 NOTE — Assessment & Plan Note (Signed)
Based on labs, I suspect this may be due to HBV.  She is positive for HBsAg and negative for HBsAb.  I am going to refer her to GI.  Follow LFTs very closely with statin.

## 2011-09-12 ENCOUNTER — Encounter (HOSPITAL_COMMUNITY): Payer: Self-pay

## 2011-09-14 ENCOUNTER — Encounter (HOSPITAL_COMMUNITY): Payer: Self-pay

## 2011-09-15 ENCOUNTER — Telehealth: Payer: Self-pay | Admitting: Cardiology

## 2011-09-15 NOTE — Telephone Encounter (Signed)
New problem:  Patient calling, would like to discuss carotid stenosis.

## 2011-09-15 NOTE — Telephone Encounter (Signed)
Spoke with pt

## 2011-09-16 ENCOUNTER — Encounter (HOSPITAL_COMMUNITY): Payer: Self-pay

## 2011-09-19 ENCOUNTER — Encounter (HOSPITAL_COMMUNITY): Payer: Self-pay

## 2011-09-21 ENCOUNTER — Encounter (HOSPITAL_COMMUNITY): Payer: Self-pay

## 2011-09-23 ENCOUNTER — Encounter (HOSPITAL_COMMUNITY): Payer: Self-pay

## 2011-09-26 ENCOUNTER — Encounter (HOSPITAL_COMMUNITY): Payer: Self-pay

## 2011-09-28 ENCOUNTER — Other Ambulatory Visit (INDEPENDENT_AMBULATORY_CARE_PROVIDER_SITE_OTHER): Payer: BC Managed Care – PPO

## 2011-09-28 ENCOUNTER — Encounter (HOSPITAL_COMMUNITY): Payer: Self-pay

## 2011-09-28 ENCOUNTER — Encounter (INDEPENDENT_AMBULATORY_CARE_PROVIDER_SITE_OTHER): Payer: BC Managed Care – PPO

## 2011-09-28 DIAGNOSIS — I2510 Atherosclerotic heart disease of native coronary artery without angina pectoris: Secondary | ICD-10-CM

## 2011-09-28 DIAGNOSIS — R0989 Other specified symptoms and signs involving the circulatory and respiratory systems: Secondary | ICD-10-CM

## 2011-09-28 DIAGNOSIS — I6529 Occlusion and stenosis of unspecified carotid artery: Secondary | ICD-10-CM

## 2011-09-28 LAB — HEPATIC FUNCTION PANEL
AST: 50 U/L — ABNORMAL HIGH (ref 0–37)
Albumin: 4.4 g/dL (ref 3.5–5.2)
Alkaline Phosphatase: 44 U/L (ref 39–117)
Total Protein: 7.3 g/dL (ref 6.0–8.3)

## 2011-09-29 ENCOUNTER — Telehealth: Payer: Self-pay | Admitting: *Deleted

## 2011-09-29 NOTE — Telephone Encounter (Signed)
Spoke with pt. Pt states she is having muscle pain. She does not want to continue crestor. She is stopping crestor. I will forward to Dr Shirlee Latch for review.

## 2011-09-30 ENCOUNTER — Encounter (HOSPITAL_COMMUNITY): Payer: Self-pay

## 2011-10-03 ENCOUNTER — Encounter (HOSPITAL_COMMUNITY): Payer: Self-pay

## 2011-10-04 ENCOUNTER — Ambulatory Visit: Payer: Self-pay | Admitting: Gastroenterology

## 2011-10-05 ENCOUNTER — Encounter (HOSPITAL_COMMUNITY): Payer: Self-pay

## 2011-10-07 ENCOUNTER — Encounter (HOSPITAL_COMMUNITY): Payer: Self-pay

## 2011-10-10 ENCOUNTER — Encounter (HOSPITAL_COMMUNITY): Payer: Self-pay

## 2011-10-12 ENCOUNTER — Encounter (HOSPITAL_COMMUNITY): Payer: Self-pay

## 2011-10-14 ENCOUNTER — Encounter (HOSPITAL_COMMUNITY): Payer: Self-pay

## 2011-10-17 ENCOUNTER — Encounter (HOSPITAL_COMMUNITY): Payer: Self-pay

## 2011-10-19 ENCOUNTER — Encounter (HOSPITAL_COMMUNITY): Payer: Self-pay

## 2011-10-21 ENCOUNTER — Encounter (HOSPITAL_COMMUNITY): Payer: Self-pay

## 2011-10-24 ENCOUNTER — Encounter (HOSPITAL_COMMUNITY): Payer: Self-pay

## 2011-10-26 ENCOUNTER — Encounter (HOSPITAL_COMMUNITY): Payer: Self-pay

## 2011-10-28 ENCOUNTER — Encounter (HOSPITAL_COMMUNITY): Payer: Self-pay

## 2011-10-31 ENCOUNTER — Encounter (HOSPITAL_COMMUNITY): Payer: Self-pay

## 2011-11-02 ENCOUNTER — Encounter (HOSPITAL_COMMUNITY): Payer: Self-pay

## 2011-11-04 ENCOUNTER — Encounter (HOSPITAL_COMMUNITY): Payer: Self-pay

## 2011-11-07 ENCOUNTER — Encounter (HOSPITAL_COMMUNITY): Payer: Self-pay

## 2011-11-09 ENCOUNTER — Encounter (HOSPITAL_COMMUNITY): Payer: Self-pay

## 2011-11-11 ENCOUNTER — Encounter (HOSPITAL_COMMUNITY): Payer: Self-pay

## 2011-11-13 IMAGING — CT CT HEAD W/O CM
1 of 2 series · 13 of 30 positions shown, 17 images · non-contrast
Comparison: None.

CLINICAL DATA: Acute onset aphasia.  Symptoms resolved.

CT HEAD WITHOUT CONTRAST
TECHNIQUE: Contiguous axial images were obtained from the base of
the skull through the vertex without contrast.

[Series 2: brain · axial · 0.49mm/px · z∈[+90,+210]mm · 13 of 28 slices shown, 17 images]
[im 2/28  brain]
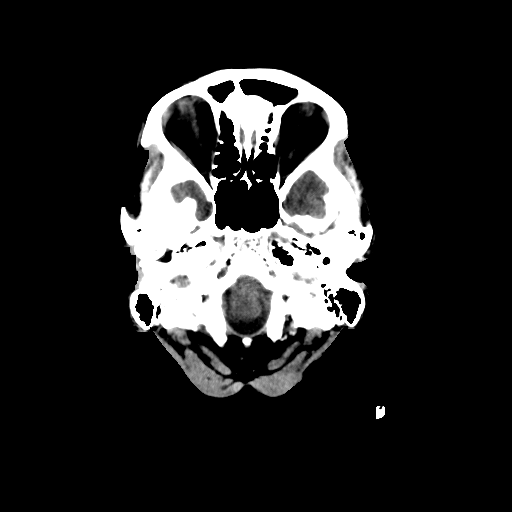
[im 2/28  bone]
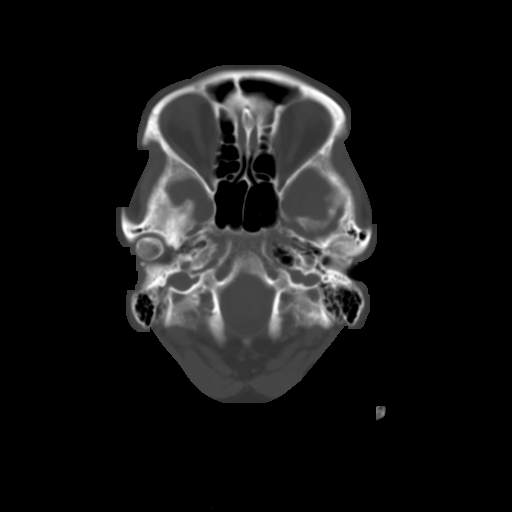
[im 4/28  brain]
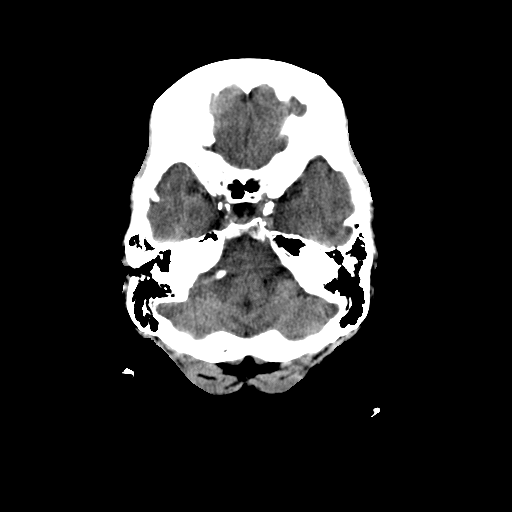
[im 6/28  brain]
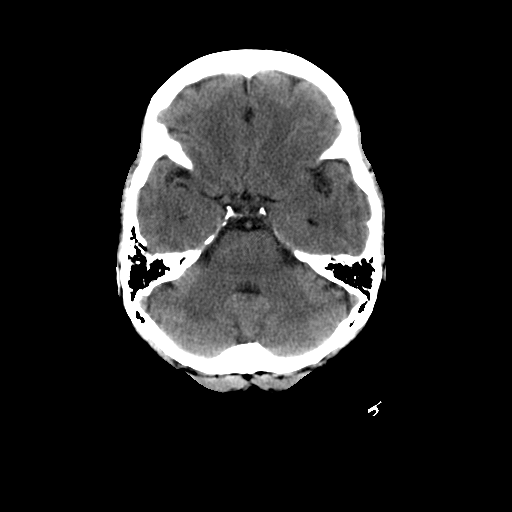
[im 8/28  brain]
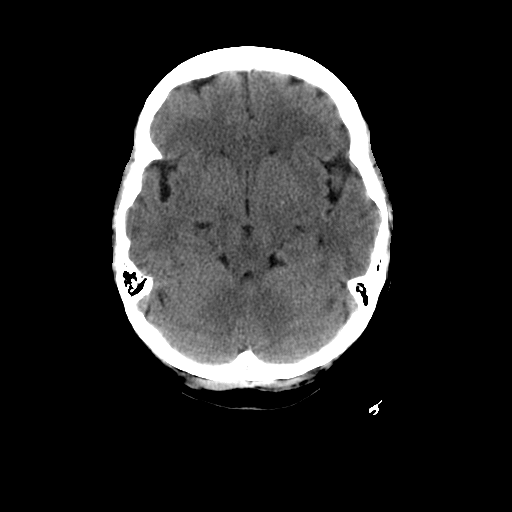
[im 10/28  brain]
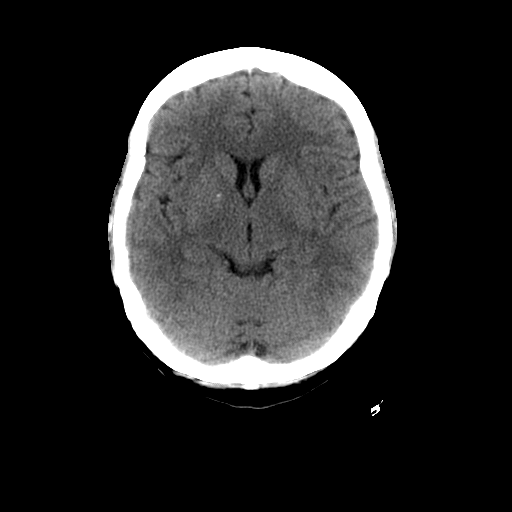
[im 10/28  bone]
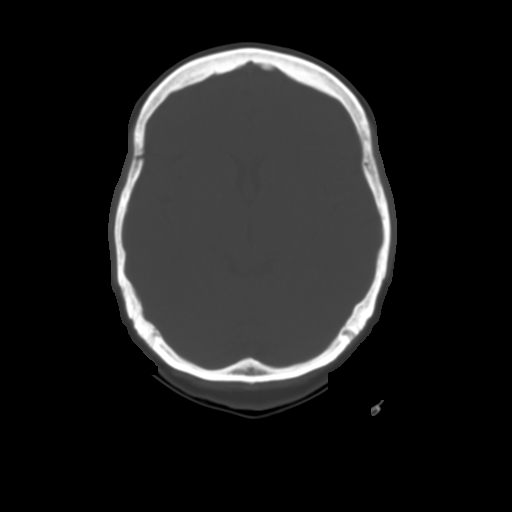
[im 12/28  brain]
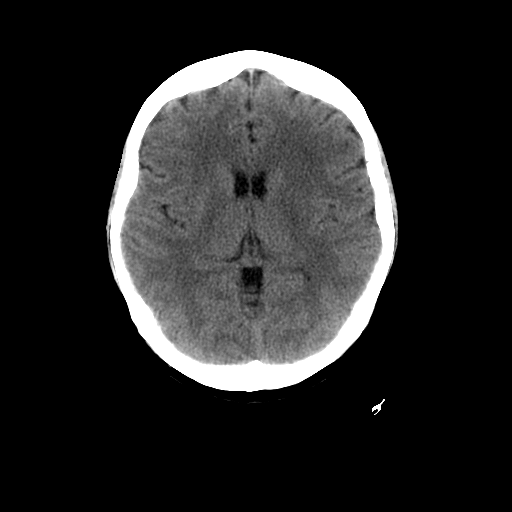
[im 14/28  brain]
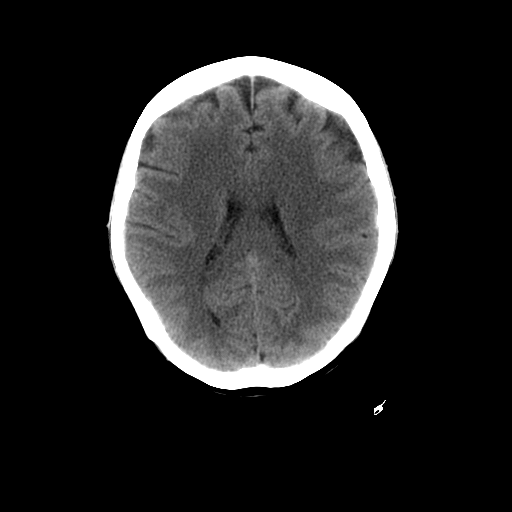
[im 16/28  brain]
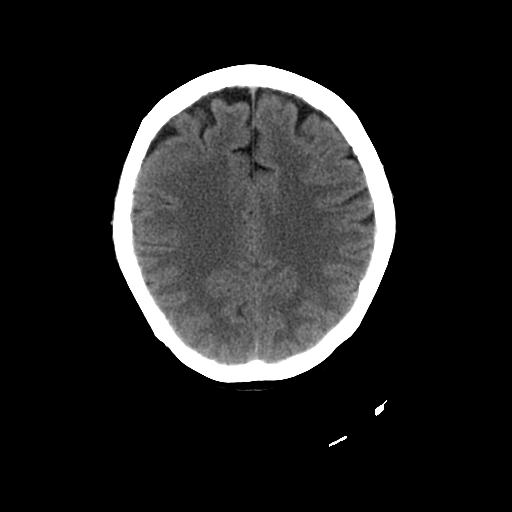
[im 18/28  brain]
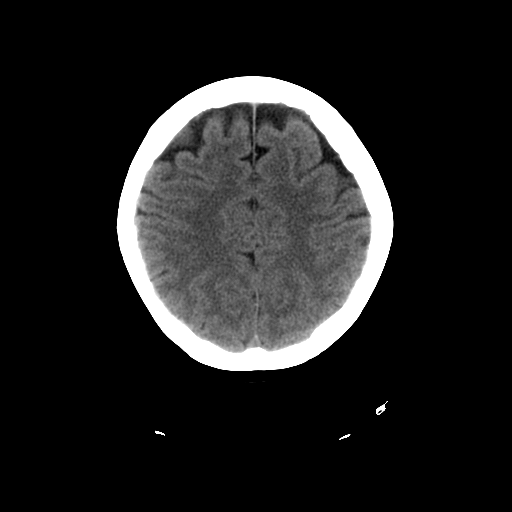
[im 18/28  bone]
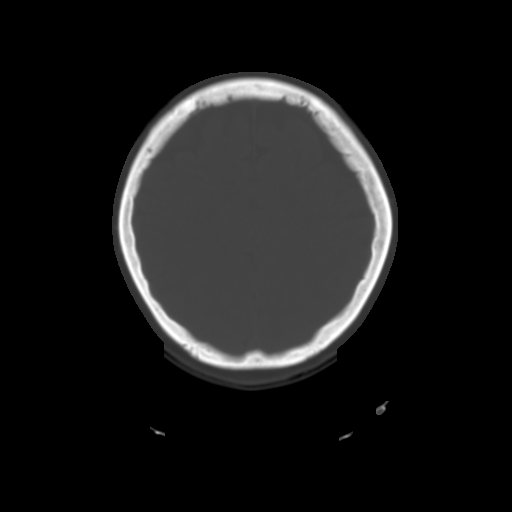
[im 20/28  brain]
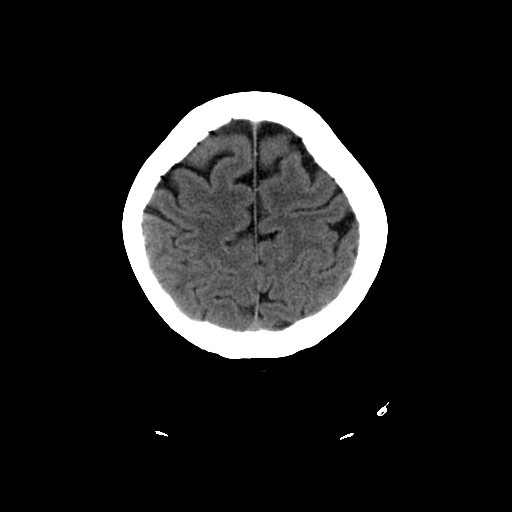
[im 22/28  brain]
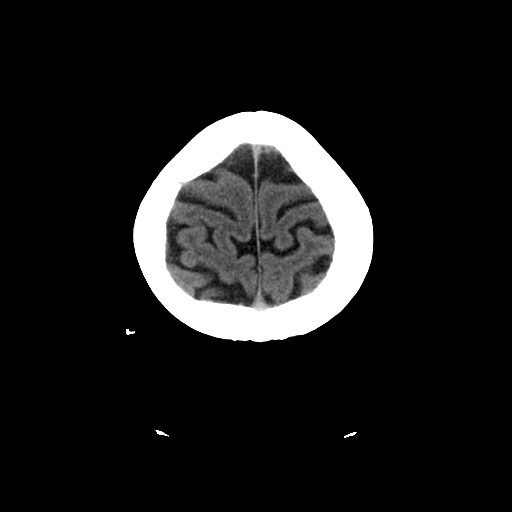
[im 24/28  brain]
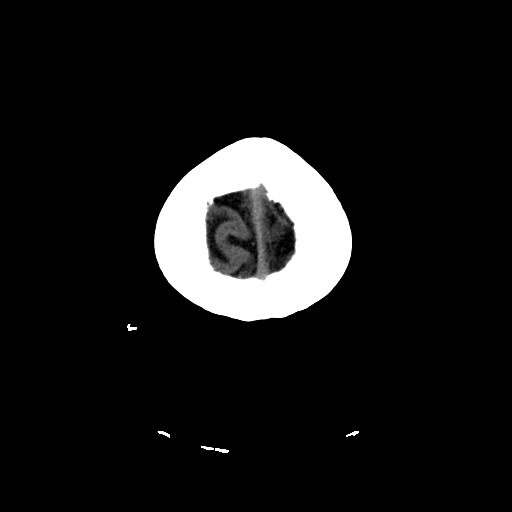
[im 26/28  brain]
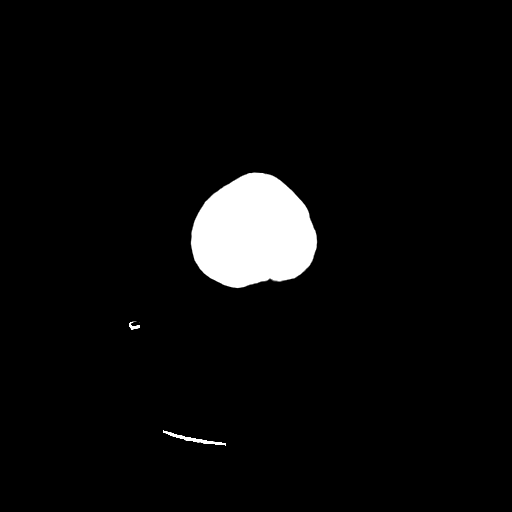
[im 26/28  bone]
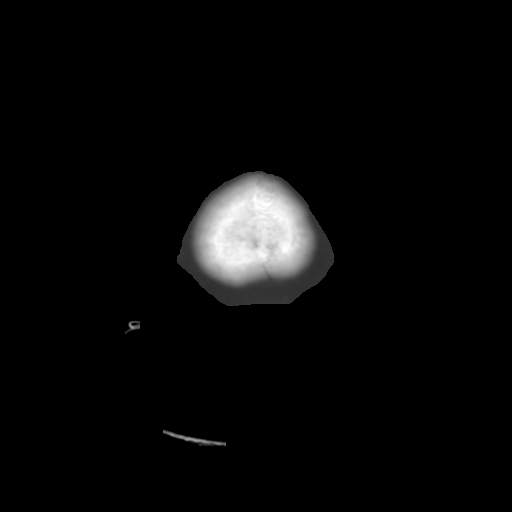

[13 of 30 positions shown; findings below may reference images not displayed]

FINDINGS: The brain appears normal without evidence of acute
infarction, hemorrhage, mass lesion, mass effect, midline shift or
abnormal extra-axial fluid collection.  No hydrocephalus of
pneumocephalus.  Atherosclerotic vascular disease noted.  Calvarium
intact.  Imaged paranasal sinuses and mastoid air cells clear.
IMPRESSION: No acute finding.

## 2011-11-14 ENCOUNTER — Encounter (HOSPITAL_COMMUNITY): Payer: Self-pay

## 2011-11-16 ENCOUNTER — Encounter (HOSPITAL_COMMUNITY): Payer: Self-pay

## 2011-11-18 ENCOUNTER — Encounter (HOSPITAL_COMMUNITY): Payer: Self-pay

## 2011-11-21 ENCOUNTER — Encounter (HOSPITAL_COMMUNITY): Payer: Self-pay

## 2011-11-23 ENCOUNTER — Encounter (HOSPITAL_COMMUNITY): Payer: Self-pay

## 2011-11-25 ENCOUNTER — Encounter (HOSPITAL_COMMUNITY): Payer: Self-pay

## 2011-11-28 ENCOUNTER — Encounter (HOSPITAL_COMMUNITY): Payer: Self-pay

## 2011-11-30 ENCOUNTER — Encounter (HOSPITAL_COMMUNITY): Payer: Self-pay

## 2011-12-02 ENCOUNTER — Encounter (HOSPITAL_COMMUNITY): Payer: Self-pay

## 2011-12-05 ENCOUNTER — Encounter (HOSPITAL_COMMUNITY): Payer: Self-pay

## 2011-12-07 ENCOUNTER — Encounter (HOSPITAL_COMMUNITY): Payer: Self-pay

## 2011-12-09 ENCOUNTER — Encounter (HOSPITAL_COMMUNITY): Payer: Self-pay

## 2012-02-08 ENCOUNTER — Ambulatory Visit (INDEPENDENT_AMBULATORY_CARE_PROVIDER_SITE_OTHER): Payer: BC Managed Care – PPO | Admitting: Cardiology

## 2012-02-08 ENCOUNTER — Encounter: Payer: Self-pay | Admitting: Cardiology

## 2012-02-08 VITALS — BP 124/64 | HR 72 | Ht <= 58 in | Wt 98.0 lb

## 2012-02-08 DIAGNOSIS — I2510 Atherosclerotic heart disease of native coronary artery without angina pectoris: Secondary | ICD-10-CM

## 2012-02-08 DIAGNOSIS — I10 Essential (primary) hypertension: Secondary | ICD-10-CM

## 2012-02-08 DIAGNOSIS — R0989 Other specified symptoms and signs involving the circulatory and respiratory systems: Secondary | ICD-10-CM

## 2012-02-08 DIAGNOSIS — E785 Hyperlipidemia, unspecified: Secondary | ICD-10-CM

## 2012-02-08 DIAGNOSIS — R7989 Other specified abnormal findings of blood chemistry: Secondary | ICD-10-CM

## 2012-02-08 DIAGNOSIS — I6529 Occlusion and stenosis of unspecified carotid artery: Secondary | ICD-10-CM

## 2012-02-08 MED ORDER — PRAVASTATIN SODIUM 20 MG PO TABS: 20.0000 mg | ORAL_TABLET | Freq: Every evening | ORAL | Status: DC

## 2012-02-08 NOTE — Patient Instructions (Addendum)
Start Pravachol 20mg  daily in the evening.   Your physician recommends that you return for lab work in: 1 month--liver profile.  Your physician recommends that you return for a FASTING lipid profile /liver profile in 2 months.  Your physician has requested that you have a carotid duplex. This test is an ultrasound of the carotid arteries in your neck. It looks at blood flow through these arteries that supply the brain with blood. Allow one hour for this exam. There are no restrictions or special instructions. October 2013  Your physician wants you to follow-up in: 6 months with Dr Shirlee Latch. (February 2014). You will receive a reminder letter in the mail two months in advance. If you don't receive a letter, please call our office to schedule the follow-up appointment.

## 2012-02-11 NOTE — Progress Notes (Signed)
Patient ID: [name], female   DOB: [birth year], 66 y.o.   MRN: [id number]  Date:  02/11/2012   Name:  [name]       DOB:  1946-01-23 MRN:  [id number]  PCP:  Dr. Cleta Alberts   History of Present Illness: [name] is a 66 y.o. female who presents for follow up.  She saw me early last year for chest pain. She refused stress testing in the past. She was admitted to [facility] 03/2011 with a NSTEMI. LHC demonstrated single vessel CAD with 95% mLAD and this was tx with a Promus DES. EF was preserved. She was reluctant to start most medications. She refused beta blocker due to h/o side effects in the past. She was seen in followup in 11/12.  She continued to note chest pain.  She also noted palpitations.  She complained of significant fatigue and was actually riding a scooter around in a grocery store.  We set her up for a relook heart catheterization in 11/12: LAD stent patent, proximal RCA 25%, EF 65%.  She requested that her Plavix be changed due to side effects.  She took Effient for a few days and then went back to Plavix due to side effects.    Recently, her LFTs were mildly elevated so she stopped her statin.  She was actually found to be positive for HBsAg and negative for HBsAb which is suggestive of active HBV.  She refused evaluation by GI.  I tried her on a different statin, Crestor 5 mg every other day.  She had muscle pain with this so stopped it also.    She has had no further chest pain or palpitations.  No exertional dyspnea.  She walks up to 30 minutes daily.  She has a thyroid-area nodule with plan for needle biopsy in the near future.    Labs (11/12): Potassium 4.2, creatinine 0.2, TC 112, TG 112, HDL 49, LDL 41. Labs (2/13): K 4.1, creatinine 0.73, LDL 84, HDL 59, HBsAB negative, HBsAg positive, HCV negative, AST 43, ALT 67 Labs (4/13): AST 50, ALT 91  PMH: 1. GERD with hiatal hernia 2. HBV 3. HTN 4. Hyperlipidemia 5. CAD: NSTEMI 10/12 with 95% mLAD stenosis  tx with Promus DES.  EF 60% on LV-gram.  Relook cath in 11/12 with patent LAD stent, EF 65%.   6. Medical noncompliance.  7. Carotid stenosis: carotid dopplers (4/13) 60-79% RICA, 40-59% LICA.  8. Thyroid nodule.   Current Outpatient Prescriptions  Medication Sig Dispense Refill  . aspirin 81 MG chewable tablet Chew 81 mg by mouth daily.        . clopidogrel (PLAVIX) 75 MG tablet Take 1 tablet (75 mg total) by mouth daily.  30 tablet  6  . losartan (COZAAR) 50 MG tablet Take 1 tablet (50 mg total) by mouth daily.  30 tablet  6  . NITROSTAT 0.4 MG SL tablet As needed for chest pain every 5 minutes up to 3 times      . pravastatin (PRAVACHOL) 20 MG tablet Take 1 tablet (20 mg total) by mouth every evening.  30 tablet  2    Allergies: Allergies  Allergen Reactions  . Ibuprofen     REACTION: rapid heart rate    History  Substance Use Topics  . Smoking status: Never Smoker   . Smokeless tobacco: Not on file  . Alcohol Use: No   Originally [place]   FH: No premature CAD  ROS:  Please see  the history of present illness.   All other systems reviewed and negative.   PHYSICAL EXAM: VS:  BP 124/64  Pulse 72  Ht 4\' 8"  (1.422 m)  Wt 98 lb (44.453 kg)  BMI 21.97 kg/m2 Well nourished, well developed, in no acute distress HEENT: normal Neck: no JVD, nodule in thyroid area.  Cardiac:  normal S1, S2; RRR; 1/6 SEM.  Bilateral carotid bruits.  Lungs:  clear to auscultation bilaterally, no wheezing, rhonchi or rales Abd: soft, nontender, no hepatomegaly Ext: no edema; Right femoral arteriotomy site without hematoma or bruit Skin: warm and dry

## 2012-02-11 NOTE — Assessment & Plan Note (Signed)
No exertional chest pain or dyspnea.  Continue ASA, Plavix, losartan.  She was unable to tolerate every other day low dose Crestor.  She is willing to try pravastatin 20 mg daily with LFTs in 1 month and lipids/LFTs in 2 months. She has had a stable mild LFT elevation, apparently due to HBV.

## 2012-02-11 NOTE — Assessment & Plan Note (Signed)
As above, will attempt pravastatin 20 mg daily.

## 2012-02-11 NOTE — Assessment & Plan Note (Signed)
Moderate carotid stenosis.  Repeat dopplers in 10/13.

## 2012-02-11 NOTE — Assessment & Plan Note (Signed)
Based on hepatitis labs, I suspect this may be due to HBV.  She was positive for HBsAg and negative for HBsAb. I have tried to get her to go for a GI evaluation but she has so far refused.  She also does not want me to recheck her HBV labs.  Follow LFTs very closely with statin.

## 2012-02-14 ENCOUNTER — Telehealth: Payer: Self-pay | Admitting: Cardiology

## 2012-02-14 ENCOUNTER — Other Ambulatory Visit: Payer: BC Managed Care – PPO

## 2012-02-14 ENCOUNTER — Other Ambulatory Visit: Payer: Self-pay | Admitting: Otolaryngology

## 2012-02-14 DIAGNOSIS — E041 Nontoxic single thyroid nodule: Secondary | ICD-10-CM

## 2012-02-14 NOTE — Telephone Encounter (Signed)
ok 

## 2012-02-14 NOTE — Telephone Encounter (Signed)
I will forward to Dr McLean for review and recommendations. 

## 2012-02-14 NOTE — Telephone Encounter (Signed)
Can pt stop plavix for 5 days for biopsy waiting to hear from [place] to schedule due to pt stating we may have to wait til Oct because that is when she will stop plavix

## 2012-02-14 NOTE — Telephone Encounter (Signed)
Spoke with Tammy at [facility]. She states biopsy can wait until October. Tammy is asking for permission to hold Plavix for 5 days after 04/04/12. I will forward to Dr Shirlee Latch for review.

## 2012-02-14 NOTE — Telephone Encounter (Signed)
Unless urgent/emergent, would continue Plavix without interruption until at least 10/13.

## 2012-02-15 NOTE — Telephone Encounter (Signed)
Spoke with Tammy. She is aware of Dr Alford Highland recommendation for holding Plavix.

## 2012-02-27 ENCOUNTER — Other Ambulatory Visit: Payer: Self-pay | Admitting: Cardiology

## 2012-03-01 ENCOUNTER — Other Ambulatory Visit: Payer: Self-pay | Admitting: *Deleted

## 2012-03-01 MED ORDER — CLOPIDOGREL BISULFATE 75 MG PO TABS: 75.0000 mg | ORAL_TABLET | Freq: Every day | ORAL | Status: DC

## 2012-03-01 NOTE — Telephone Encounter (Signed)
Refilled losartan 

## 2012-03-01 NOTE — Telephone Encounter (Signed)
Refilled plavix 

## 2012-03-07 ENCOUNTER — Other Ambulatory Visit (INDEPENDENT_AMBULATORY_CARE_PROVIDER_SITE_OTHER): Payer: BC Managed Care – PPO

## 2012-03-07 ENCOUNTER — Telehealth: Payer: Self-pay | Admitting: Cardiology

## 2012-03-07 DIAGNOSIS — I2510 Atherosclerotic heart disease of native coronary artery without angina pectoris: Secondary | ICD-10-CM

## 2012-03-07 LAB — HEPATIC FUNCTION PANEL
Albumin: 4.3 g/dL (ref 3.5–5.2)
Alkaline Phosphatase: 54 U/L (ref 39–117)
Total Bilirubin: 0.9 mg/dL (ref 0.3–1.2)

## 2012-03-07 NOTE — Telephone Encounter (Signed)
Plz return call to patient at [phone] regarding medical questions

## 2012-03-07 NOTE — Telephone Encounter (Signed)
Attempted to contact pt at [phone]. This is a number for [address] of [place] and [place].

## 2012-03-08 NOTE — Telephone Encounter (Signed)
Attempted this number again. Still get recording for [employer].

## 2012-03-13 ENCOUNTER — Telehealth: Payer: Self-pay | Admitting: Cardiology

## 2012-03-13 NOTE — Telephone Encounter (Signed)
Spoke with pt

## 2012-03-13 NOTE — Telephone Encounter (Signed)
Pt not at home. Pt's husband aware of Dr Alford Highland recommendation

## 2012-03-13 NOTE — Telephone Encounter (Signed)
Patient returning nurse call she can be reached at hm#  °

## 2012-03-13 NOTE — Telephone Encounter (Signed)
She may stop Plavix in 1 year.

## 2012-03-13 NOTE — Telephone Encounter (Signed)
Pt had DES done 04/04/11. She was told she would need to take Plavix for 1 year. Pt asking if she can discontinue Plavix after 04/03/12.  I will forward to Dr Shirlee Latch for review.

## 2012-03-26 ENCOUNTER — Telehealth: Payer: Self-pay | Admitting: Cardiology

## 2012-03-26 DIAGNOSIS — E785 Hyperlipidemia, unspecified: Secondary | ICD-10-CM

## 2012-03-26 NOTE — Telephone Encounter (Signed)
Pt c/o leg cramping and confusion and insists that it is due to her pravastatin.  Would like Dr. Shirlee Latch to suggest a med change.

## 2012-03-26 NOTE — Telephone Encounter (Signed)
plz return call to pt [phone], regarding medication change.

## 2012-03-26 NOTE — Telephone Encounter (Signed)
She can try Crestor 5 mg daily with lipids/LFTs in 2 months (to replace pravastatin).

## 2012-03-27 ENCOUNTER — Other Ambulatory Visit: Payer: Self-pay | Admitting: *Deleted

## 2012-03-27 NOTE — Telephone Encounter (Signed)
Spoke with pt. Pt states she has been unable to tolerate Crestor and atorvastatin. She would like to try the lowest dose of simvastatin. I will forward to Dr Shirlee Latch for review and recommendations.

## 2012-03-27 NOTE — Telephone Encounter (Signed)
Attempted to call pt back twice with Dr. Alford Highland order.

## 2012-03-27 NOTE — Telephone Encounter (Signed)
NA unable to leave a message

## 2012-03-27 NOTE — Telephone Encounter (Signed)
Would have her try simvastatin 10 mg daily with lipids/LFTs in 2 months.

## 2012-03-28 MED ORDER — SIMVASTATIN 10 MG PO TABS: 10.0000 mg | ORAL_TABLET | Freq: Every day | ORAL | Status: DC

## 2012-03-28 NOTE — Telephone Encounter (Signed)
Pt advised,verbalized understanding. Pt will return for fasting lipid/liver profile 05/28/12.

## 2012-03-29 ENCOUNTER — Other Ambulatory Visit: Payer: Self-pay

## 2012-04-02 ENCOUNTER — Telehealth: Payer: Self-pay | Admitting: Cardiology

## 2012-04-02 MED ORDER — CLOPIDOGREL BISULFATE 75 MG PO TABS: 75.0000 mg | ORAL_TABLET | Freq: Every day | ORAL | Status: DC

## 2012-04-02 NOTE — Telephone Encounter (Signed)
Pt would like to talk about changing her simvastatin

## 2012-04-02 NOTE — Telephone Encounter (Signed)
Try atorvastatin 20 mg daily.

## 2012-04-02 NOTE — Telephone Encounter (Signed)
Spoke with pt. Pt would like to start atorvastatin 5mg  daily. Pt states she had muscle aches with both atorvastatin 20mg  and atorvastatin 40mg .  She had muscle aches with Crestor, nightmares with pravachol, and does not want to try simvastatin over concerns about nightmares since she would take it in the evening. I will forward to Dr Shirlee Latch for review

## 2012-04-02 NOTE — Telephone Encounter (Signed)
Pt not at home--LMTCB 

## 2012-04-02 NOTE — Telephone Encounter (Signed)
Pt had nightmares when she was taking pravastatin in the evening. She is concerned about starting simvastatin because she is supposed to take it in the evening. She does not want to try it even for a few days and she does not want to consider taking it another time of day. She was on atorvastatin 40mg  and is asking if she can try a lower mg of this to see if she may tolerate a lower dose. I will forward to Dr Shirlee Latch for review.

## 2012-04-02 NOTE — Telephone Encounter (Signed)
That is fine. See if she would go for atorva 10.

## 2012-04-03 MED ORDER — ATORVASTATIN CALCIUM 10 MG PO TABS: ORAL_TABLET | ORAL | Status: DC

## 2012-04-03 NOTE — Telephone Encounter (Signed)
NA

## 2012-04-03 NOTE — Telephone Encounter (Signed)
Spoke with pt. Pt prefers to start atorvastatin 5mg  daily. If she tolerates this OK she will try to increase to 10mg  daily.

## 2012-04-10 ENCOUNTER — Other Ambulatory Visit (INDEPENDENT_AMBULATORY_CARE_PROVIDER_SITE_OTHER): Payer: BC Managed Care – PPO

## 2012-04-10 ENCOUNTER — Encounter (INDEPENDENT_AMBULATORY_CARE_PROVIDER_SITE_OTHER): Payer: BC Managed Care – PPO

## 2012-04-10 DIAGNOSIS — I2510 Atherosclerotic heart disease of native coronary artery without angina pectoris: Secondary | ICD-10-CM

## 2012-04-10 DIAGNOSIS — I6529 Occlusion and stenosis of unspecified carotid artery: Secondary | ICD-10-CM

## 2012-04-10 LAB — LIPID PANEL
Cholesterol: 141 mg/dL (ref 0–200)
HDL: 47.6 mg/dL (ref >39.00)
LDL Cholesterol: 67 mg/dL (ref 0–99)
VLDL: 26.8 mg/dL (ref 0.0–40.0)

## 2012-04-10 LAB — HEPATIC FUNCTION PANEL
ALT: 43 U/L — ABNORMAL HIGH (ref 0–35)
Total Protein: 6.9 g/dL (ref 6.0–8.3)

## 2012-05-01 ENCOUNTER — Ambulatory Visit
Admission: RE | Admit: 2012-05-01 | Discharge: 2012-05-01 | Disposition: A | Discharge status: Home / Self Care | Payer: BC Managed Care – PPO | Source: Ambulatory Visit | Attending: Otolaryngology | Admitting: Otolaryngology

## 2012-05-01 ENCOUNTER — Other Ambulatory Visit (HOSPITAL_COMMUNITY)
Admission: RE | Admit: 2012-05-01 | Discharge: 2012-05-01 | Disposition: A | Discharge status: Home / Self Care | Payer: Medicare Other | Source: Ambulatory Visit | Attending: Interventional Radiology | Admitting: Interventional Radiology

## 2012-05-01 DIAGNOSIS — E041 Nontoxic single thyroid nodule: Secondary | ICD-10-CM

## 2012-05-01 DIAGNOSIS — E049 Nontoxic goiter, unspecified: Secondary | ICD-10-CM | POA: Insufficient documentation

## 2012-05-24 ENCOUNTER — Other Ambulatory Visit: Payer: Self-pay | Admitting: Cardiology

## 2012-05-24 ENCOUNTER — Other Ambulatory Visit: Payer: Self-pay | Admitting: *Deleted

## 2012-05-24 MED ORDER — NITROGLYCERIN 0.4 MG SL SUBL: 0.4000 mg | SUBLINGUAL_TABLET | SUBLINGUAL | Status: DC | PRN

## 2012-05-28 ENCOUNTER — Other Ambulatory Visit (INDEPENDENT_AMBULATORY_CARE_PROVIDER_SITE_OTHER): Payer: BC Managed Care – PPO

## 2012-05-28 DIAGNOSIS — E785 Hyperlipidemia, unspecified: Secondary | ICD-10-CM

## 2012-05-28 LAB — HEPATIC FUNCTION PANEL
ALT: 39 U/L — ABNORMAL HIGH (ref 0–35)
AST: 31 U/L (ref 0–37)
Albumin: 4.3 g/dL (ref 3.5–5.2)
Alkaline Phosphatase: 55 U/L (ref 39–117)
Total Protein: 7.3 g/dL (ref 6.0–8.3)

## 2012-05-28 LAB — LIPID PANEL
Cholesterol: 130 mg/dL (ref 0–200)
Triglycerides: 120 mg/dL (ref 0.0–149.0)

## 2012-06-20 ENCOUNTER — Telehealth: Payer: Self-pay | Admitting: Cardiology

## 2012-06-20 NOTE — Telephone Encounter (Signed)
Pt states that the atorvastatin was causing her heart to beat rapidly just like the pravastatin did.  She stopped the atorvastatin two weeks ago and her heart is no longer beating rapidly.

## 2012-06-20 NOTE — Telephone Encounter (Signed)
New problem:   C/O irregular heart rate at night .

## 2012-06-22 NOTE — Telephone Encounter (Signed)
Would see if she has been able to take any of the statins in the past.

## 2012-06-22 NOTE — Telephone Encounter (Signed)
Spoke with pt. Pt states she cannot tolerate any statin.

## 2012-09-17 ENCOUNTER — Other Ambulatory Visit: Payer: Self-pay | Admitting: Cardiology

## 2013-03-26 ENCOUNTER — Other Ambulatory Visit: Payer: Self-pay | Admitting: Cardiology

## 2013-05-27 ENCOUNTER — Other Ambulatory Visit: Payer: Self-pay | Admitting: Cardiology

## 2013-05-28 ENCOUNTER — Ambulatory Visit (INDEPENDENT_AMBULATORY_CARE_PROVIDER_SITE_OTHER): Payer: BC Managed Care – HMO | Admitting: Family Medicine

## 2013-05-28 ENCOUNTER — Encounter: Payer: Self-pay | Admitting: Family Medicine

## 2013-05-28 VITALS — BP 102/62 | HR 80 | Temp 98.0°F | Resp 16 | Ht <= 58 in | Wt 99.4 lb

## 2013-05-28 DIAGNOSIS — Z8679 Personal history of other diseases of the circulatory system: Secondary | ICD-10-CM

## 2013-05-28 DIAGNOSIS — I10 Essential (primary) hypertension: Secondary | ICD-10-CM

## 2013-05-28 DIAGNOSIS — Z139 Encounter for screening, unspecified: Secondary | ICD-10-CM

## 2013-05-28 DIAGNOSIS — R748 Abnormal levels of other serum enzymes: Secondary | ICD-10-CM

## 2013-05-28 DIAGNOSIS — Z0000 Encounter for general adult medical examination without abnormal findings: Secondary | ICD-10-CM

## 2013-05-28 LAB — COMPREHENSIVE METABOLIC PANEL
AST: 19 U/L (ref 0–37)
Alkaline Phosphatase: 55 U/L (ref 39–117)
BUN: 11 mg/dL (ref 6–23)
Calcium: 9.3 mg/dL (ref 8.4–10.5)
Creat: 0.78 mg/dL (ref 0.50–1.10)
Total Bilirubin: 0.4 mg/dL (ref 0.3–1.2)

## 2013-05-28 LAB — LIPID PANEL
HDL: 52 mg/dL (ref >39)
LDL Cholesterol: 101 mg/dL — ABNORMAL HIGH (ref 0–99)
Total CHOL/HDL Ratio: 3.7 Ratio

## 2013-05-28 MED ORDER — LOSARTAN POTASSIUM 50 MG PO TABS: ORAL_TABLET | ORAL | Status: DC

## 2013-05-28 NOTE — Progress Notes (Signed)
Welcome to Medicare physical  History: Patient is here for a medical exam to take advantage of her welcome to Medicare physical. She wants to know how she is doing, but does not desire to have a lot of screening testing done.  Past medical history: Medical illnesses: History of coronary artery disease and coronary stent a couple of years ago. No other major medical illnesses. She did have some problems with elevated liver enzymes and that was evaluated and followed up on. The last set of liver enzymes was fairly good. Surgical history: She had a cesarean section in her child was born, and has had hemorrhoid surgery and a sigmoidoscopy with her hemorrhoids. Colonoscopy history: None Mammogram history: None Pap smear history: None Allergies: Intolerant to the statins. The NSAIDs cause her heart to race Current medications: Sublingual much glycerin, aspirin, losartan Gravida 1 para 1  Family history: Both parents are deceased. Her 5 siblings live in the [place] and she does not know the detail of their history. One brother died of diabetes  Social history: She is retired from [employer]. He is married, lives here [place], her son is in [place]. She does not smoke, drink, use substances. She walks regularly. She does work around [employer] and yard and garden. She does not attend church.  Review of systems: Constitutional: Unremarkable HEENT: Unremarkable Respiratory: Unremarkable Cardiovascular: Unremarkable Gastrointestinal: Unremarkable Genitourinary: Unremarkable  Is not sexually involved and declines a Pap smear Musculoskeletal: Unremarkable Neurologic: Unremarkable Psychiatric: Unremarkable Dermatologic: Unremarkable Hematologic: Unremarkable   Physical examination Well-developed well-nourished lady in no major distress. Her TMs are normal. Eyes have very small pupils but fundi appear benign. Eyes react to light. Throat is clear. She is edentulous. She has dentures but  she does not wear them. Neck was supple without significant nodes. No carotid bruits. No thyromegaly. Chest clear to auscultation. Heart regular without murmurs gallops or arrhythmias. Breasts are soft without any masses. No axillary nodes. Abdomen soft without masses or tenderness. Lower abdominal midline cesarean scar. Pulses good. Pelvic and rectal not done. Extremities unremarkable. She is very mobile. Skin unremarkable. Neurologic grossly unremarkable.  Assessment: Welcome to Medicare physical examination, normal History of coronary artery disease History of cesarean section Remote history of hemorrhoids History of liver enzyme elevations, stable  Plan: Encouraged her to consider a flu shot, pneumonia vaccine, Pap smear, colonoscopy, mammogram. She declines doing so.  Will check a blood chemistries panel for following up on her liver enzyme elevations, as well as lipids because of her coronary artery disease history.

## 2013-05-29 ENCOUNTER — Encounter: Payer: Self-pay | Admitting: *Deleted

## 2013-05-31 ENCOUNTER — Encounter (HOSPITAL_COMMUNITY): Payer: Self-pay | Admitting: Emergency Medicine

## 2013-05-31 ENCOUNTER — Emergency Department (HOSPITAL_COMMUNITY): Payer: Medicare Other

## 2013-05-31 ENCOUNTER — Observation Stay (HOSPITAL_COMMUNITY)
Admission: EM | Admit: 2013-05-31 | Discharge: 2013-06-01 | Disposition: A | Discharge status: Home / Self Care | Payer: Medicare Other | Attending: Internal Medicine | Admitting: Internal Medicine

## 2013-05-31 DIAGNOSIS — Z79899 Other long term (current) drug therapy: Secondary | ICD-10-CM | POA: Insufficient documentation

## 2013-05-31 DIAGNOSIS — R079 Chest pain, unspecified: Principal | ICD-10-CM

## 2013-05-31 DIAGNOSIS — K449 Diaphragmatic hernia without obstruction or gangrene: Secondary | ICD-10-CM | POA: Insufficient documentation

## 2013-05-31 DIAGNOSIS — K219 Gastro-esophageal reflux disease without esophagitis: Secondary | ICD-10-CM | POA: Insufficient documentation

## 2013-05-31 DIAGNOSIS — M129 Arthropathy, unspecified: Secondary | ICD-10-CM | POA: Insufficient documentation

## 2013-05-31 DIAGNOSIS — R911 Solitary pulmonary nodule: Secondary | ICD-10-CM | POA: Diagnosis present

## 2013-05-31 DIAGNOSIS — I10 Essential (primary) hypertension: Secondary | ICD-10-CM

## 2013-05-31 DIAGNOSIS — Z7982 Long term (current) use of aspirin: Secondary | ICD-10-CM | POA: Insufficient documentation

## 2013-05-31 DIAGNOSIS — R198 Other specified symptoms and signs involving the digestive system and abdomen: Secondary | ICD-10-CM

## 2013-05-31 DIAGNOSIS — Z888 Allergy status to other drugs, medicaments and biological substances status: Secondary | ICD-10-CM | POA: Insufficient documentation

## 2013-05-31 DIAGNOSIS — R0989 Other specified symptoms and signs involving the circulatory and respiratory systems: Secondary | ICD-10-CM

## 2013-05-31 DIAGNOSIS — E785 Hyperlipidemia, unspecified: Secondary | ICD-10-CM

## 2013-05-31 DIAGNOSIS — R002 Palpitations: Secondary | ICD-10-CM

## 2013-05-31 DIAGNOSIS — R7989 Other specified abnormal findings of blood chemistry: Secondary | ICD-10-CM

## 2013-05-31 DIAGNOSIS — I2510 Atherosclerotic heart disease of native coronary artery without angina pectoris: Secondary | ICD-10-CM

## 2013-05-31 LAB — BASIC METABOLIC PANEL
Calcium: 9 mg/dL (ref 8.4–10.5)
Chloride: 100 mEq/L (ref 96–112)
Creatinine, Ser: 0.8 mg/dL (ref 0.50–1.10)
GFR calc Af Amer: 87 mL/min — ABNORMAL LOW (ref >90)
GFR calc non Af Amer: 75 mL/min — ABNORMAL LOW (ref >90)
Sodium: 138 mEq/L (ref 135–145)

## 2013-05-31 LAB — CBC
HCT: 38.2 % (ref 36.0–46.0)
MCH: 31.3 pg (ref 26.0–34.0)
MCHC: 34.3 g/dL (ref 30.0–36.0)
MCV: 91.2 fL (ref 78.0–100.0)
Platelets: 449 10*3/uL — ABNORMAL HIGH (ref 150–400)
RDW: 12.4 % (ref 11.5–15.5)
WBC: 10 10*3/uL (ref 4.0–10.5)

## 2013-05-31 LAB — TROPONIN I: Troponin I: <0.3 ng/mL (ref <0.30)

## 2013-05-31 LAB — PRO B NATRIURETIC PEPTIDE: Pro B Natriuretic peptide (BNP): 31.1 pg/mL (ref 0–125)

## 2013-05-31 MED ORDER — SODIUM CHLORIDE 0.9 % IJ SOLN
3.0000 mL | Freq: Two times a day (BID) | INTRAMUSCULAR | Status: DC
  Administered 2013-06-01: 3 mL via INTRAVENOUS

## 2013-05-31 MED ORDER — ONDANSETRON HCL 4 MG PO TABS: 4.0000 mg | ORAL_TABLET | Freq: Four times a day (QID) | ORAL | Status: DC | PRN

## 2013-05-31 MED ORDER — LOSARTAN POTASSIUM 50 MG PO TABS
50.0000 mg | ORAL_TABLET | Freq: Every day | ORAL | Status: DC
  Administered 2013-06-01: 50 mg via ORAL
  Filled 2013-05-31: qty 1

## 2013-05-31 MED ORDER — ENOXAPARIN SODIUM 30 MG/0.3ML ~~LOC~~ SOLN
30.0000 mg | SUBCUTANEOUS | Status: DC
  Administered 2013-06-01: 30 mg via SUBCUTANEOUS
  Filled 2013-05-31: qty 0.3

## 2013-05-31 MED ORDER — ONDANSETRON HCL 4 MG/2ML IJ SOLN: 4.0000 mg | Freq: Four times a day (QID) | INTRAMUSCULAR | Status: DC | PRN

## 2013-05-31 MED ORDER — ACETAMINOPHEN 650 MG RE SUPP: 650.0000 mg | Freq: Four times a day (QID) | RECTAL | Status: DC | PRN

## 2013-05-31 MED ORDER — NITROGLYCERIN 2 % TD OINT
0.5000 [in_us] | TOPICAL_OINTMENT | Freq: Four times a day (QID) | TRANSDERMAL | Status: DC
  Administered 2013-05-31 – 2013-06-01 (×2): 0.5 [in_us] via TOPICAL
  Filled 2013-05-31: qty 1
  Filled 2013-05-31: qty 30

## 2013-05-31 MED ORDER — ALUM & MAG HYDROXIDE-SIMETH 200-200-20 MG/5ML PO SUSP: 30.0000 mL | Freq: Four times a day (QID) | ORAL | Status: DC | PRN

## 2013-05-31 MED ORDER — HYDROMORPHONE HCL PF 1 MG/ML IJ SOLN: 0.5000 mg | INTRAMUSCULAR | Status: DC | PRN

## 2013-05-31 MED ORDER — ASPIRIN EC 325 MG PO TBEC
325.0000 mg | DELAYED_RELEASE_TABLET | Freq: Every day | ORAL | Status: DC
  Administered 2013-06-01: 325 mg via ORAL
  Filled 2013-05-31: qty 1

## 2013-05-31 MED ORDER — ZOLPIDEM TARTRATE 5 MG PO TABS: 5.0000 mg | ORAL_TABLET | Freq: Every evening | ORAL | Status: DC | PRN

## 2013-05-31 MED ORDER — ACETAMINOPHEN 325 MG PO TABS: 650.0000 mg | ORAL_TABLET | Freq: Four times a day (QID) | ORAL | Status: DC | PRN

## 2013-05-31 MED ORDER — SODIUM CHLORIDE 0.9 % IV SOLN: INTRAVENOUS | Status: DC

## 2013-05-31 MED ORDER — NITROGLYCERIN 0.4 MG SL SUBL: 0.4000 mg | SUBLINGUAL_TABLET | SUBLINGUAL | Status: DC | PRN

## 2013-05-31 MED ORDER — OXYCODONE HCL 5 MG PO TABS: 5.0000 mg | ORAL_TABLET | ORAL | Status: DC | PRN

## 2013-05-31 NOTE — ED Notes (Signed)
Pt. C/o chest pain starting tonight at 1730 with SOB. Pt. Took 3 nitro and ASA. States the nitro helped and pain has eased off. Pt. Also reports taking daily potassium and is worried her potassium is high. Hx of MI. Pt. Alert and oriented x4.

## 2013-05-31 NOTE — ED Provider Notes (Addendum)
CSN: [id number]     Arrival date & time 05/31/13  1742 History   First MD Initiated Contact with Patient 05/31/13 2014     Chief Complaint  Patient presents with  . Chest Pain   (Consider location/radiation/quality/duration/timing/severity/associated sxs/prior Treatment) The history is provided by the patient, the spouse and medical records.    This is a 67 year old female with a past medical history of NSTEMI in 2012 presents emergency Department with chief complaint of racing heart and mild chest pain.  Patient states that today about 5 PM after she was eating she had sudden onset of feeling of racing and her heart.  States she has had some associated mild chest pain without pressure, radiation, nausea, vomiting, diaphoresis.  Patient took 3 Nitrostat she felt helped resolve her symptoms.  Denies fevers, chills, myalgias, arthralgias. Denies DOE, SOB, chest tightness or pressure, radiation to left arm, jaw or back. Denies dysuria, flank pain, suprapubic pain, frequency, urgency, or hematuria. Denies headaches, light headedness, weakness, visual disturbances. Denies abdominal pain, nausea, vomiting, diarrhea or constipation.    Past Medical History  Diagnosis Date  . GERD (gastroesophageal reflux disease)   . Hiatal hernia   . Arthritis   . HTN (hypertension)   . HLD (hyperlipidemia)   . CAD (coronary artery disease)     s/p NSTEMI 10/12: LHC 04/02/11: mLAD 95%, EF 60%; PCI: Promus DES to mLAD; LHC 04/29/11:   LAD stent patent, proximal RCA 25%, EF 65%    Past Surgical History  Procedure Laterality Date  . Cesarean section    . Tonsillectomy    . Hemorroidectomy      1999   Family History  Problem Relation Age of Onset  . Prostate cancer Father   . Diabetes Sister    History  Substance Use Topics  . Smoking status: Never Smoker   . Smokeless tobacco: Not on file  . Alcohol Use: No   OB History   Grav Para Term Preterm Abortions TAB SAB Ect Mult Living                  Review of Systems  Constitutional: Negative for fever and chills.  HENT: Negative for trouble swallowing.   Respiratory: Negative for shortness of breath.   Cardiovascular: Positive for chest pain and palpitations.  Gastrointestinal: Negative for nausea, vomiting, abdominal pain, diarrhea and constipation.  Genitourinary: Negative for dysuria and hematuria.  Musculoskeletal: Negative for arthralgias and myalgias.  Skin: Negative for rash.  Neurological: Negative for numbness.  All other systems reviewed and are negative.    Allergies  Atorvastatin; Crestor; Ibuprofen; and Pravastatin sodium  Home Medications   Current Outpatient Rx  Name  Route  Sig  Dispense  Refill  . aspirin 81 MG chewable tablet   Oral   Chew 81 mg by mouth daily.           [name] losartan (COZAAR) 50 MG tablet   Oral   Take 50 mg by mouth daily.         . nitroGLYCERIN (NITROSTAT) 0.4 MG SL tablet   Sublingual   Place 0.4 mg under the tongue every 5 (five) minutes as needed for chest pain.          BP 121/58  Pulse 84  Temp(Src) 97.9 F (36.6 C) (Oral)  Resp 19  Ht 4\' 8"  (1.422 m)  Wt 99 lb (44.906 kg)  BMI 22.21 kg/m2  SpO2 100% Physical Exam Physical Exam  Nursing note and vitals reviewed.  Constitutional: She is oriented to person, place, and time. She appears well-developed and well-nourished. No distress.  HENT:  Head: Normocephalic and atraumatic.  Eyes: Conjunctivae normal and EOM are normal. Pupils are equal, round, and reactive to light. No scleral icterus.  Neck: Normal range of motion.  Cardiovascular: Normal rate, regular rhythm and normal heart sounds.  Exam reveals no gallop and no friction rub.   No murmur heard. Pulmonary/Chest: Effort normal and breath sounds normal. No respiratory distress.  Abdominal: Soft. Bowel sounds are normal. She exhibits no distension and no mass. There is no tenderness. There is no guarding.  Neurological: She is alert and oriented to person,  place, and time.  Skin: Skin is warm and dry. She is not diaphoretic.    ED Course  Procedures (including critical care time) Labs Review Labs Reviewed  CBC - Abnormal; Notable for the following:    Platelets 449 (*)    All other components within normal limits  BASIC METABOLIC PANEL - Abnormal; Notable for the following:    Glucose, Bld 179 (*)    GFR calc non Af Amer 75 (*)    GFR calc Af Amer 87 (*)    All other components within normal limits  PRO B NATRIURETIC PEPTIDE  POCT I-STAT TROPONIN I   Imaging Review Dg Chest 2 View  05/31/2013   CLINICAL DATA:  67 year old female with chest pain and shortness of breath.  EXAM: CHEST  2 VIEW  COMPARISON:  None.  FINDINGS: The cardiomediastinal silhouette is unremarkable. A coronary stent is again identified.  A probable right upper lobe nodule is present.  There is no evidence of pulmonary edema, pleural effusion, pneumothorax or acute bony abnormality.  IMPRESSION: Probable right upper lobe nodule - chest CT with contrast is recommended for further evaluation.   Electronically Signed   By: Laveda Abbe M.D.   On: 05/31/2013 19:37    EKG Interpretation    Date/Time:  Friday May 31 2013 17:48:08 EST Ventricular Rate:  97 PR Interval:  118 QRS Duration: 72 QT Interval:  344 QTC Calculation: 436 R Axis:   70 Text Interpretation:  Normal sinus rhythm Nonspecific ST and T wave abnormality Abnormal ECG Improved inferior and lateral T wave inversions compared to prior ECG Confirmed by WARD  DO, KRISTEN ([id number]) on 05/31/2013 9:04:03 PM            MDM   1. Chest pain   2. Palpitations   3. CAD (coronary artery disease)   4. Lung nodule, solitary   5. Hyperlipidemia   6. Hypertension    9:18 PM BP 121/58  Pulse 84  Temp(Src) 97.9 F (36.6 C) (Oral)  Resp 19  Ht 4\' 8"  (1.422 m)  Wt 99 lb (44.906 kg)  BMI 22.21 kg/m2  SpO2 100% Patient with episode of racing heart and mild chest pain.  Patient is seen and cared visit  with Dr. Elesa Massed.  He states that this is similar to her previous MI where she had racing heart, palpitations and mild chest pain.  She did have a CT to diaphoresis at that time and did not have any today. The patient has stable vital signs here in the ED.    10:12 PM' BP 129/55  Pulse 78  Temp(Src) 97.9 F (36.6 C) (Oral)  Resp 23  Ht 4\' 8"  (1.422 m)  Wt 99 lb (44.906 kg)  BMI 22.21 kg/m2  SpO2 98% Patient here with complaint of racing heart and chest pain.  Pain resolved with nitroglycerin.  Patient has a history of amphetamine 2 years ago.  As she needs admission for cardiac rule out and cycle troponins.  I discussed this with the patient who agrees to plan of care.  Patient admitted for cardiac r/o by Dr. Addison Naegeli, PA-C 06/01/13 1029  Arthor Captain, PA-C 06/11/13 [phone]

## 2013-05-31 NOTE — ED Notes (Signed)
Dr. Jenkins at bedside. 

## 2013-05-31 NOTE — ED Provider Notes (Signed)
Medical screening examination/treatment/procedure(s) were conducted as a shared visit with non-physician practitioner(s) and myself.  I personally evaluated the patient during the encounter.  EKG Interpretation    Date/Time:  Friday May 31 2013 17:48:08 EST Ventricular Rate:  97 PR Interval:  118 QRS Duration: 72 QT Interval:  344 QTC Calculation: 436 R Axis:   70 Text Interpretation:  Normal sinus rhythm Nonspecific ST and T wave abnormality Abnormal ECG Improved inferior and lateral T wave inversions compared to prior ECG Confirmed by Itzae Mccurdy  DO, Kevion Fatheree ([id number]) on 05/31/2013 9:04:03 PM            Pt is a 67 y.o. hypertension, hyperlipidemia, cardiac disease who presents emergency department with acute onset palpitations and chest pressure that started this afternoon. Resolved after 3 nitroglycerin. Cardiac labs are negative. Chest x-ray clear. EKG shows no new ischemic changes. Will admit for ACS rule out. States her last stress test and cath for 3 years ago.  [name] Maw Laurent Carg[place], DO 05/31/13 2253

## 2013-05-31 NOTE — ED Notes (Signed)
Pt reports that she started having chest pain this afternoon. Reports that she had a stent placed 3 years ago. Reports she took 3 nitro and ASA pta. Reports mild relief with the nitro. Also having some SOB.

## 2013-05-31 NOTE — ED Notes (Signed)
Pt. Family member upset about wait time. Explained process to patient and plan of care. Explained to patient the reasoning behind getting a second set of bloodwork. Pt. Family states "I don't really understand, but you know better than I do". Explained process in a different manner and family member seemed to be more understanding.

## 2013-06-01 ENCOUNTER — Encounter (HOSPITAL_COMMUNITY): Payer: Self-pay | Admitting: *Deleted

## 2013-06-01 DIAGNOSIS — E785 Hyperlipidemia, unspecified: Secondary | ICD-10-CM

## 2013-06-01 DIAGNOSIS — I2510 Atherosclerotic heart disease of native coronary artery without angina pectoris: Secondary | ICD-10-CM

## 2013-06-01 DIAGNOSIS — R198 Other specified symptoms and signs involving the digestive system and abdomen: Secondary | ICD-10-CM

## 2013-06-01 DIAGNOSIS — R002 Palpitations: Secondary | ICD-10-CM

## 2013-06-01 DIAGNOSIS — R911 Solitary pulmonary nodule: Secondary | ICD-10-CM

## 2013-06-01 DIAGNOSIS — R079 Chest pain, unspecified: Secondary | ICD-10-CM

## 2013-06-01 DIAGNOSIS — I10 Essential (primary) hypertension: Secondary | ICD-10-CM

## 2013-06-01 LAB — BASIC METABOLIC PANEL
BUN: 16 mg/dL (ref 6–23)
CO2: 25 mEq/L (ref 19–32)
Chloride: 106 mEq/L (ref 96–112)
Creatinine, Ser: 0.7 mg/dL (ref 0.50–1.10)
GFR calc Af Amer: >90 mL/min (ref >90)
Potassium: 3.6 mEq/L (ref 3.5–5.1)
Sodium: 141 mEq/L (ref 135–145)

## 2013-06-01 LAB — TROPONIN I: Troponin I: <0.3 ng/mL (ref <0.30)

## 2013-06-01 LAB — CBC
HCT: 35.4 % — ABNORMAL LOW (ref 36.0–46.0)
Hemoglobin: 11.8 g/dL — ABNORMAL LOW (ref 12.0–15.0)
MCHC: 33.3 g/dL (ref 30.0–36.0)
MCV: 90.3 fL (ref 78.0–100.0)
WBC: 7.7 10*3/uL (ref 4.0–10.5)

## 2013-06-01 NOTE — Progress Notes (Signed)
Utilization Review completed.  

## 2013-06-01 NOTE — H&P (Signed)
[facility] History and Physical  [name] QIO:[id number] DOB: [birth year] DOA: 05/31/2013  Referring physician:  PCP: Janace Hoard, MD  Specialists:   Chief Complaint:   HPI: [name] is a 67 y.o. female with a history of CAD with an MI 2 years ago who  Presents to the ED with complaints of chest discomfort and felling as if her heart was racing .  Her symptoms began around 5:30 Pm and she rated the pain as a 3/10, she was concerned because her MI felt this way.   She denies having SOB, or nausea or vomiting or diaphoresis.   In the ED, her initial cardiac workup was negative and she was referred for medical admission.      Review of Systems: The patient denies anorexia, fever, chills, headaches, weight loss, vision loss, diplopia, dizziness, decreased hearing, rhinitis, hoarseness, syncope, dyspnea on exertion, peripheral edema, balance deficits, cough, hemoptysis, abdominal pain, nausea, vomiting, diarrhea, constipation, hematemesis, melena, hematochezia, severe indigestion/heartburn, dysuria, hematuria, incontinence, muscle weakness, suspicious skin lesions, transient blindness, difficulty walking, depression, unusual weight change, abnormal bleeding, enlarged lymph nodes, angioedema, and breast masses.    Past Medical History  Diagnosis Date  . GERD (gastroesophageal reflux disease)   . Hiatal hernia   . Arthritis   . HTN (hypertension)   . HLD (hyperlipidemia)   . CAD (coronary artery disease)     s/p NSTEMI 10/12: LHC 04/02/11: mLAD 95%, EF 60%; PCI: Promus DES to mLAD; LHC 04/29/11:   LAD stent patent, proximal RCA 25%, EF 65%   . NSTEMI (non-ST elevated myocardial infarction) 2012    Past Surgical History  Procedure Laterality Date  . Cesarean section    . Tonsillectomy    . Hemorroidectomy      1999  . Coronary stent placement  2012    Prior to Admission medications   Medication Sig Start Date End Date Taking? Authorizing Provider  aspirin  81 MG chewable tablet Chew 81 mg by mouth daily.     Yes Historical Provider, MD  losartan (COZAAR) 50 MG tablet Take 50 mg by mouth daily.   Yes Historical Provider, MD  nitroGLYCERIN (NITROSTAT) 0.4 MG SL tablet Place 0.4 mg under the tongue every 5 (five) minutes as needed for chest pain.   Yes Historical Provider, MD    Allergies  Allergen Reactions  . Atorvastatin Other (See Comments)    Muscle pain  . Crestor [Rosuvastatin] Other (See Comments)    Muscle pain  . Ibuprofen     REACTION: rapid heart rate  . Pravastatin Sodium Other (See Comments)    Muscle cramps/ confusion    Social History:  reports that she has never smoked. She does not have any smokeless tobacco history on file. She reports that she does not drink alcohol or use illicit drugs.     Family History  Problem Relation Age of Onset  . Prostate cancer Father   . Diabetes Sister     (be sure to complete)   Physical Exam:  GEN:  Pleasant Well nourished and well developed Elderly  67 y.o. Asian female  examined  and in no acute distress; cooperative with exam Filed Vitals:   05/31/13 2200 05/31/13 2253 05/31/13 2357 06/01/13 0200  BP: 129/55 155/76 145/54 122/56  Pulse: 78 86 88 79  Temp:   98.3 F (36.8 C) 98.5 F (36.9 C)  TempSrc:   Oral Oral  Resp: 23 16 20 18   Height:   4\' 8"  (1.422 m)  Weight:   45.224 kg (99 lb 11.2 oz)   SpO2: 98% 98% 97% 95%   Blood pressure 122/56, pulse 79, temperature 98.5 F (36.9 C), temperature source Oral, resp. rate 18, height 4\' 8"  (1.422 m), weight 45.224 kg (99 lb 11.2 oz), SpO2 95.00%. PSYCH: SHe is alert and oriented x4; does not appear anxious does not appear depressed; affect is normal HEENT: Normocephalic and Atraumatic, Mucous membranes pink; PERRLA; EOM intact; Fundi:  Benign;  No scleral icterus, Nares: Patent, Oropharynx: Clear, Edentulous, Neck:  FROM, no cervical lymphadenopathy nor thyromegaly or carotid bruit; no JVD; Breasts:: Not examined CHEST WALL:  No tenderness CHEST: Normal respiration, clear to auscultation bilaterally HEART: Regular rate and rhythm; no murmurs rubs or gallops BACK: No kyphosis or scoliosis; no CVA tenderness ABDOMEN: Positive Bowel Sounds, soft non-tender; no masses, no organomegaly, no pannus; no intertriginous candida. Rectal Exam: Not done EXTREMITIES: No cyanosis, clubbing or edema; no ulcerations. Genitalia: not examined PULSES: 2+ and symmetric SKIN: Normal hydration no rash or ulceration CNS: Cranial nerves 2-12 grossly intact no focal neurologic deficit    Labs on Admission:  Basic Metabolic Panel:  Recent Labs Lab 05/28/13 0921 05/31/13 1751 06/01/13 0410  NA 141 138 141  K 3.8 4.2 3.6  CL 106 100 106  CO2 28 24 25   GLUCOSE 106* 179* 101*  BUN 11 20 16   CREATININE 0.78 0.80 4.09  CALCIUM 9.3 9.0 8.8   Liver Function Tests:  Recent Labs Lab 05/28/13 0921  AST 19  ALT 16  ALKPHOS 55  BILITOT 0.4  PROT 7.2  ALBUMIN 4.4   No results found for this basename: LIPASE, AMYLASE,  in the last 168 hours No results found for this basename: AMMONIA,  in the last 168 hours CBC:  Recent Labs Lab 05/31/13 1751 06/01/13 0410  WBC 10.0 7.7  HGB 13.1 11.8*  HCT 38.2 35.4*  MCV 91.2 90.3  PLT 449* 401*   Cardiac Enzymes:  Recent Labs Lab 05/31/13 2137  TROPONINI <0.30    BNP (last 3 results)  Recent Labs  05/31/13 1751  PROBNP 31.1   CBG: No results found for this basename: GLUCAP,  in the last 168 hours  Radiological Exams on Admission: Dg Chest 2 View  05/31/2013   CLINICAL DATA:  67 year old female with chest pain and shortness of breath.  EXAM: CHEST  2 VIEW  COMPARISON:  None.  FINDINGS: The cardiomediastinal silhouette is unremarkable. A coronary stent is again identified.  A probable right upper lobe nodule is present.  There is no evidence of pulmonary edema, pleural effusion, pneumothorax or acute bony abnormality.  IMPRESSION: Probable right upper lobe nodule -  chest CT with contrast is recommended for further evaluation.   Electronically Signed   By: Laveda Abbe M.D.   On: 05/31/2013 19:37     EKG: Independently reviewed. Normal sinus rhythm without Acute S-T changes rate 87.     Assessment/Plan Principal Problem:   Chest pain Active Problems:   Palpitations   Lung nodule, solitary   Hyperlipidemia   Hypertension   CAD (coronary artery disease)      1.   Chest Pain-   Admit to 23 hour Observation for cardiac rule out, cycle troponins, Nitropaste, O2, ASA Rx.  Consider Outpt versus Inpatient Stress Testing.    2.   Palpitations-  Check TSH level.    3.   Lung Nodule - seen on CXR,  Send for CT scan of Chest in AM to further  evaluate.    4.   CAD-  Hx, on ASA, and ARB RX, and NTG PRN.     5.   HTN-  On Losartan/HCTZ.    6.   Hyperlipidemia- but intolerant to Statins, caused Muscle pain and spasms.   Check Fasting Lipids.     7.  DVT prophylaxis with Lovenox.      Code Status:   FULL CODE Family Communication:   No Family present  Disposition Plan:   Observation Status  Time spent:  33 Minutes  Ron Parker [facility] Pager [phone]  If 7PM-7AM, please contact night-coverage [email or web address] Password [facility] 06/01/2013, 5:45 AM

## 2013-06-01 NOTE — Discharge Summary (Signed)
Physician Discharge Summary  Patient ID: [name] MRN: [id number] DOB/AGE: [birth year] 67 y.o.  Admit date: 05/31/2013 Discharge date: 06/01/2013  Primary Care Physician:  Janace Hoard, MD  Discharge Diagnoses:    . Chest pain- chronic intermittent  . Lung nodule, solitary . Hypertension . CAD (coronary artery disease) . Hyperlipidemia . Palpitations  Consults:  Cardiology, Dr. Wyline Mood   Recommendations for Outpatient Follow-up:  Further outpatient cardiology workup if patient desires Please follow TSH   Allergies:   Allergies  Allergen Reactions  . Atorvastatin Other (See Comments)    Muscle pain  . Crestor [Rosuvastatin] Other (See Comments)    Muscle pain  . Ibuprofen     REACTION: rapid heart rate  . Pravastatin Sodium Other (See Comments)    Muscle cramps/ confusion     Discharge Medications:   Medication List         aspirin 81 MG chewable tablet  Chew 81 mg by mouth daily.     losartan 50 MG tablet  Commonly known as:  COZAAR  Take 50 mg by mouth daily.     nitroGLYCERIN 0.4 MG SL tablet  Commonly known as:  NITROSTAT  Place 0.4 mg under the tongue every 5 (five) minutes as needed for chest pain.         Brief H and P: For complete details please refer to admission H and P, but in brief patient is a 67 year old female with history of coronary disease, MI 2 years ago presented with chest discomfort and felt as if her heart was racing. Patient's symptoms started around 5:30 PM prior to admission and was concerned about cardiac MI. Otherwise she denied any shortness of breath, nausea, vomiting or diaphoresis. The patient was admitted for observation  Hospital Course:  Principal Problem:   Chest pain with history of coronary artery disease: Given her status of prior CAD, hypertension hyperlipidemia, she was admitted for observation.  Patient was admitted to telemetry. Cardiac enzymes remained negative so far. EKG showed no acute ischemic  changes. cardiology was consulted. After detailed discussion with the patient, patient was hesitant for any further testing or medications. She has an appointment with her primary cardiologist, Dr. Lucien Mons on 06/28/13. She was cleared to be discharged home from cardiac standpoint.     Palpitations: Patient was recommended possibly trying AV nodal blocking agents to prevent palpitations however patient declined. She did was hesitant for any further testing, patient is to readdress her palpitations with her primary cardiologist next month.    Day of Discharge BP 122/56  Pulse 88  Temp(Src) 98.3 F (36.8 C) (Oral)  Resp 18  Ht 4\' 8"  (1.422 m)  Wt 45.224 kg (99 lb 11.2 oz)  BMI 22.37 kg/m2  SpO2 99%  Physical Exam: General: Alert and awake oriented x3 not in any acute distress. HEENT: anicteric sclera, pupils reactive to light and accommodation CVS: S1-S2 clear no murmur rubs or gallops Chest: clear to auscultation bilaterally, no wheezing rales or rhonchi Abdomen: soft nontender, nondistended, normal bowel sounds Extremities: no cyanosis, clubbing or edema noted bilaterally Neuro: Cranial nerves II-XII intact, no focal neurological deficits   The results of significant diagnostics from this hospitalization (including imaging, microbiology, ancillary and laboratory) are listed below for reference.    LAB RESULTS: Basic Metabolic Panel:  Recent Labs Lab 05/31/13 1751 06/01/13 0410  NA 138 141  K 4.2 3.6  CL 100 106  CO2 24 25  GLUCOSE 179* 101*  BUN 20 16  CREATININE 0.80 0.70  CALCIUM 9.0 8.8   Liver Function Tests:  Recent Labs Lab 05/28/13 0921  AST 19  ALT 16  ALKPHOS 55  BILITOT 0.4  PROT 7.2  ALBUMIN 4.4   No results found for this basename: LIPASE, AMYLASE,  in the last 168 hours No results found for this basename: AMMONIA,  in the last 168 hours CBC:  Recent Labs Lab 05/31/13 1751 06/01/13 0410  WBC 10.0 7.7  HGB 13.1 11.8*  HCT 38.2 35.4*  MCV  91.2 90.3  PLT 449* 401*   Cardiac Enzymes:  Recent Labs Lab 05/31/13 2137 06/01/13 0843  TROPONINI <0.30 <0.30   BNP: No components found with this basename: POCBNP,  CBG: No results found for this basename: GLUCAP,  in the last 168 hours  Significant Diagnostic Studies:  Dg Chest 2 View  05/31/2013   CLINICAL DATA:  67 year old female with chest pain and shortness of breath.  EXAM: CHEST  2 VIEW  COMPARISON:  None.  FINDINGS: The cardiomediastinal silhouette is unremarkable. A coronary stent is again identified.  A probable right upper lobe nodule is present.  There is no evidence of pulmonary edema, pleural effusion, pneumothorax or acute bony abnormality.  IMPRESSION: Probable right upper lobe nodule - chest CT with contrast is recommended for further evaluation.   Electronically Signed   By: Laveda Abbe M.D.   On: 05/31/2013 19:37       Disposition and Follow-up:     Discharge Orders   Future Appointments Provider Department Dept Phone   06/28/2013 10:30 AM Laurey Morale, MD [facility] [place] Office [phone]   Future Orders Complete By Expires   Diet - low sodium heart healthy  As directed    Increase activity slowly  As directed        DISPOSITION:Home  DIET:Heart healthy diet   TESTS THAT NEED FOLLOW-UP TSH   DISCHARGE FOLLOW-UP Follow-up Information   Follow up with HOPPER,DAVID, MD. Schedule an appointment as soon as possible for a visit in 2 weeks.   Specialty:  Family Medicine   Contact information:   [address] [place] [address] [phone]       Follow up with Marca Ancona, MD On 06/28/2013. (at 10:30AM)    Specialty:  Cardiology   Contact information:   [address] [place] [address] [phone]       Time spent on Discharge: 32 minutes   Signed:   RAI,RIPUDEEP M.D. [facility] 06/01/2013, 1:17 PM Pager: [phone]

## 2013-06-01 NOTE — Consult Note (Signed)
Primary cardiologist: Dr. Elly [name] Consulting cardiologist:Dr. Dina [name]  Clinical Summary [name] is a 67 y.o.female with history of CAD with prior NSTEMI in 03/2011, found to have a 95% lesion of LAD which received a DES. LVEF at that time 55-65%. Repeat cath 04/2011 with patent stent, no new disease. She has had intermittent pain over the years however has refused non-invasive testing. Her medical management of her CAD has also been limited due to medication side effects, she was not able to tolerate beta blockers or statins, and in general does not like taking medications.  She reports yesterday episode of palpitations associated with mild dull chest pain rated 3/10. No other associated symptoms. Took NG x 3 and eventually symptoms resolved. She has had no pain since. Reports similar episode of palpitations approximately 5 months ago. Other than mild pain associated with palpitations, she has had no other chest pain recently. She reports she walks daily at the mall for 1 hour continuously, most recently yesterday without any significant symptoms. Denies any significant EtOH or caffeine intake.   EKG sinus rhythm with no ischemic changes. Trop neg x 2. CXR no acute findings, probable RUL nodule. Echo pending. TSH pending. No events on telemetry.   Allergies  Allergen Reactions  . Atorvastatin Other (See Comments)    Muscle pain  . Crestor [Rosuvastatin] Other (See Comments)    Muscle pain  . Ibuprofen     REACTION: rapid heart rate  . Pravastatin Sodium Other (See Comments)    Muscle cramps/ confusion    Medications Scheduled Medications: . aspirin EC  325 mg Oral Daily  . enoxaparin (LOVENOX) injection  30 mg Subcutaneous Q24H  . losartan  50 mg Oral Daily  . nitroGLYCERIN  0.5 inch Topical Q6H  . sodium chloride  3 mL Intravenous Q12H     Infusions: . sodium chloride       PRN Medications:  acetaminophen, acetaminophen, alum & mag hydroxide-simeth,  HYDROmorphone (DILAUDID) injection, nitroGLYCERIN, ondansetron (ZOFRAN) IV, ondansetron, oxyCODONE, zolpidem   Past Medical History  Diagnosis Date  . GERD (gastroesophageal reflux disease)   . Hiatal hernia   . Arthritis   . HTN (hypertension)   . HLD (hyperlipidemia)   . CAD (coronary artery disease)     s/p NSTEMI 10/12: LHC 04/02/11: mLAD 95%, EF 60%; PCI: Promus DES to mLAD; LHC 04/29/11:   LAD stent patent, proximal RCA 25%, EF 65%   . NSTEMI (non-ST elevated myocardial infarction) 2012    Past Surgical History  Procedure Laterality Date  . Cesarean section    . Tonsillectomy    . Hemorroidectomy      1999  . Coronary stent placement  2012    Family History  Problem Relation Age of Onset  . Prostate cancer Father   . Diabetes Sister     Social History [name] reports that she has never smoked. She does not have any smokeless tobacco history on file. [name] reports that she does not drink alcohol.  Review of Systems CONSTITUTIONAL: No weight loss, fever, chills, weakness or fatigue.  HEENT: Eyes: No visual loss, blurred vision, double vision or yellow sclerae. No hearing loss, sneezing, congestion, runny nose or sore throat.  SKIN: No rash or itching.  CARDIOVASCULAR: per HPI RESPIRATORY: No shortness of breath, cough or sputum.  GASTROINTESTINAL: No anorexia, nausea, vomiting or diarrhea. No abdominal pain or blood.  GENITOURINARY: no polyuria, no dysuria NEUROLOGICAL: No headache, dizziness, syncope, paralysis, ataxia, numbness or  tingling in the extremities. No change in bowel or bladder control.  MUSCULOSKELETAL: No muscle, back pain, joint pain or stiffness.  HEMATOLOGIC: No anemia, bleeding or bruising.  LYMPHATICS: No enlarged nodes. No history of splenectomy.  PSYCHIATRIC: No history of depression or anxiety.      Physical Examination Blood pressure 119/60, pulse 89, temperature 98.6 F (37 C), temperature source Oral, resp. rate 18, height  4\' 8"  (1.422 m), weight 99 lb 11.2 oz (45.224 kg), SpO2 96.00%. No intake or output data in the 24 hours ending 06/01/13 1053  Cardiovascular: RRR, no m/r/g, no JVD, no carotid bruits  Respiratory: CTAB  GI: soft, NT, ND  MSK: warm, no edema.   Neuro: no focal deficits  Psych: appropriate affect   Lab Results  Basic Metabolic Panel:  Recent Labs Lab 05/28/13 0921 05/31/13 1751 06/01/13 0410  NA 141 138 141  K 3.8 4.2 3.6  CL 106 100 106  CO2 28 24 25   GLUCOSE 106* 179* 101*  BUN 11 20 16   CREATININE 0.78 0.80 0.70  CALCIUM 9.3 9.0 8.8    Liver Function Tests:  Recent Labs Lab 05/28/13 0921  AST 19  ALT 16  ALKPHOS 55  BILITOT 0.4  PROT 7.2  ALBUMIN 4.4    CBC:  Recent Labs Lab 05/31/13 1751 06/01/13 0410  WBC 10.0 7.7  HGB 13.1 11.8*  HCT 38.2 35.4*  MCV 91.2 90.3  PLT 449* 401*    Cardiac Enzymes:  Recent Labs Lab 05/31/13 2137 06/01/13 0843  TROPONINI <0.30 <0.30    BNP: No components found with this basename: POCBNP,    ECG   Diagnostic Studies Cath 03/2011 Left main coronary artery was normal.  Left anterior descending artery had a 95% discrete stenosis in the mid  vessel. This was after the first septal perforator and diagonal [name].  There was normal TIMI flow. The mid and distal LAD were normal. First  diagonal [name] was a medium-sized vessel and was normal.  Circumflex coronary artery was nondominant. There was a small first  obtuse marginal [name], a small second obtuse marginal [name]. Both  were normal. The proximal and mid circumflex were normal. The third  obtuse marginal [name] was large and normal.  There was a small AV groove [name].  Right coronary artery was dominant. It was normal. There were no right-  to-left collaterals.  RAO ventriculography. RAO ventriculography was normal. EF was 60%.  There was no gradient across the aortic valve and no MR. Aortic  pressure was 130/61. LV pressure was 130/1 with a  post A-wave EDP of 3.  IMPRESSION: Films were reviewed with Dr. Excell [name]. She will proceed with  stenting of the mid LAD. The patient tolerated the diagnostic procedure  well.  FINAL ASSESSMENT: Successful percutaneous intervention of the proximal  LAD using a drug-eluting stent platform.  RECOMMENDATIONS: The patient should receive dual anti-platelet therapy  with aspirin and Plavix for a minimum of 12 months.  09/2010 Echo LVEF 55-65%, no WMAs, mild MR   Impression/Recommendations  1. Palpitations - patient reports 2 episodes over the last 5 months, associated with mild dull chest pain. She is physically active without limitation and otherwise has had no other symptoms or chest pain - EKG without evidence of ischemia, troponins negative.  - follow up TSH results, does not need inpatient echo. Can be reconsidered as outpatient. - discussed possibly trying AV nodal blocking agents to help prevent palpitations, she in general does not like medications. She has  prior side effects on metoprolol, discussed diltiazem and she does not want to start. Considered event monitor, however symptoms are only approximately every 5 months, and she is hesitant for any further testing. Can readdress at her follow up with Dr Jearld Pies in January if symptoms become more frequent.  Okay to discharge from cardiac standpoint, she already has follow up with her primary cardiologist Jan 16,2015.   [name], M.D., F.A.C.C.

## 2013-06-06 ENCOUNTER — Ambulatory Visit (INDEPENDENT_AMBULATORY_CARE_PROVIDER_SITE_OTHER): Payer: BC Managed Care – HMO | Admitting: Family Medicine

## 2013-06-06 VITALS — BP 140/68 | HR 115 | Temp 98.5°F | Resp 18 | Ht <= 58 in | Wt 98.6 lb

## 2013-06-06 DIAGNOSIS — R911 Solitary pulmonary nodule: Secondary | ICD-10-CM

## 2013-06-06 DIAGNOSIS — I10 Essential (primary) hypertension: Secondary | ICD-10-CM

## 2013-06-06 DIAGNOSIS — I2510 Atherosclerotic heart disease of native coronary artery without angina pectoris: Secondary | ICD-10-CM

## 2013-06-06 DIAGNOSIS — R002 Palpitations: Secondary | ICD-10-CM

## 2013-06-06 MED ORDER — DILTIAZEM HCL ER 120 MG PO CP24: 120.0000 mg | ORAL_CAPSULE | Freq: Every day | ORAL | Status: DC

## 2013-06-06 NOTE — Progress Notes (Signed)
Subjective:    Patient ID: [name], female    DOB: September 27, 1945, 67 y.o.   MRN: [id number]  Chief Complaint  Patient presents with  . Pt states very tired    Patient keeps stating her blood pressure is too high. wants off of losartin denies any chest pain, but states palpitations. ED visit on the 19th of Dec..Language Barrier.    HPI [name] is here today because she would like to start on a low dose of diltiazem. She was recently observed o/n at the hosp due to palpitations/CP - 12/19-20 and was r/o for ACS at that time. She was recommended to start on an AV nodal blocking agent like diltiazem during her hospitalization but pt refused at that time. However, she has now reconsidered and would like to try it to help her palpitations. Pt denies any CP but does get palpations when her BP goes up. Does get CP occ 3/10 - mild, no crushing, no pressure - had while she was in hosp and again last night, happens freq. None now.  She has had palpitations for the past 6d which are really bothering her - this is why she went into the hosp. The palpitations are intermittent - lasts for about 10 min at a time, takes nitrate for the palpitations - 1 to 3 times daily and the nitrate does seem to help.   Has a BP cuff at home but not using it very often - last night BP was 150/60.  When she gets the palpitations, she is to nervous to take her BP. She feels like the losartan is not helping at all.  She has an appt w/ her cardiologist on 1/16 - initially wanted to wait to start diltiazem until she saw her cardiologist but palpitations are bothering her to much and would like to try the dilt now. She does have a h/o goiter and had recent labs here to recheck on this.  Past Medical History  Diagnosis Date  . GERD (gastroesophageal reflux disease)   . Hiatal hernia   . Arthritis   . HTN (hypertension)   . HLD (hyperlipidemia)   . CAD (coronary artery disease)     s/p NSTEMI 10/12: LHC 04/02/11:  mLAD 95%, EF 60%; PCI: Promus DES to mLAD; LHC 04/29/11:   LAD stent patent, proximal RCA 25%, EF 65%   . NSTEMI (non-ST elevated myocardial infarction) 2012  . Multinodular thyroid    Current Outpatient Prescriptions on File Prior to Visit  Medication Sig Dispense Refill  . aspirin 81 MG chewable tablet Chew 81 mg by mouth daily.        . nitroGLYCERIN (NITROSTAT) 0.4 MG SL tablet Place 0.4 mg under the tongue every 5 (five) minutes as needed for chest pain.       No current facility-administered medications on file prior to visit.   Allergies  Allergen Reactions  . Atorvastatin Other (See Comments)    Muscle pain  . Crestor [Rosuvastatin] Other (See Comments)    Muscle pain  . Ibuprofen     REACTION: rapid heart rate  . Pravastatin Sodium Other (See Comments)    Muscle cramps/ confusion    Review of Systems  Constitutional: Positive for fatigue. Negative for fever, chills, diaphoresis and appetite change.  Eyes: Negative for visual disturbance.  Respiratory: Positive for chest tightness. Negative for cough and shortness of breath.   Cardiovascular: Positive for chest pain and palpitations. Negative for leg swelling.  Genitourinary: Negative for decreased  urine volume.  Neurological: Negative for syncope and headaches.  Hematological: Does not bruise/bleed easily.  Psychiatric/Behavioral: Positive for sleep disturbance.      BP 140/68  Pulse 115  Temp(Src) 98.5 F (36.9 C) (Oral)  Resp 18  Ht 4\' 8"  (1.422 m)  Wt 98 lb 9.6 oz (44.725 kg)  BMI 22.12 kg/m2  SpO2 99% Objective:   Physical Exam  Constitutional: She is oriented to person, place, and time. She appears well-developed and well-nourished. No distress.  HENT:  Head: Normocephalic and atraumatic.  Right Ear: Tympanic membrane, external ear and ear canal normal.  Left Ear: Tympanic membrane, external ear and ear canal normal.  Nose: Nose normal. No mucosal edema or rhinorrhea.  Mouth/Throat: Uvula is midline,  oropharynx is clear and moist and mucous membranes are normal. No oropharyngeal exudate.  Eyes: Conjunctivae are normal. Right eye exhibits no discharge. Left eye exhibits no discharge. No scleral icterus.  Neck: Normal range of motion. Neck supple.  Cardiovascular: Normal rate, regular rhythm, normal heart sounds and intact distal pulses.   Pulmonary/Chest: Effort normal and breath sounds normal.  Abdominal: Soft. Bowel sounds are normal. She exhibits no distension and no mass. There is no tenderness. There is no rebound and no guarding.  Lymphadenopathy:    She has no cervical adenopathy.  Neurological: She is alert and oriented to person, place, and time.  Skin: Skin is warm and dry. She is not diaphoretic. No erythema.  Psychiatric: She has a normal mood and affect. Her behavior is normal.     EKG: NSR, no acute ischemic changes, abnml flipped T waves in V3-4 but unchanged from prior EKG comparison on 05/31/13. Assessment & Plan:  Palpitations - Plan: EKG 12-Lead - start dilt, has f/u w/ cards on 1/16. Recent hosp for this r/o ACS.  Hypertension - explained to pt that she can't come off the losartan instead of a low dose dilt - would have to add in the dilt - perhaps if could wean up could then discuss w/ cardiologist but likely will have to stay on arb due to h/o CAD.  CAD (coronary artery disease) - Plan: EKG 12-Lead  Lung nodule, solitary - Plan: CT Chest W Contrast- pt reluctant to get CT scan but tried to reinforce importanjce  Meds ordered this encounter  Medications  . DISCONTD: diltiazem (DILACOR XR) 120 MG 24 hr capsule    Sig: Take 1 capsule (120 mg total) by mouth daily.    Dispense:  30 capsule    Refill:  0  . diltiazem (DILACOR XR) 120 MG 24 hr capsule    Sig: Take 1 capsule (120 mg total) by mouth daily.    Dispense:  30 capsule    Refill:  0    Norberto Sorenson, MD MPH

## 2013-06-09 ENCOUNTER — Ambulatory Visit (INDEPENDENT_AMBULATORY_CARE_PROVIDER_SITE_OTHER): Payer: BC Managed Care – HMO | Admitting: Family Medicine

## 2013-06-09 VITALS — BP 130/70 | HR 89 | Temp 98.1°F | Resp 16 | Ht <= 58 in | Wt 96.8 lb

## 2013-06-09 DIAGNOSIS — G479 Sleep disorder, unspecified: Secondary | ICD-10-CM

## 2013-06-09 DIAGNOSIS — F411 Generalized anxiety disorder: Secondary | ICD-10-CM

## 2013-06-09 DIAGNOSIS — R002 Palpitations: Secondary | ICD-10-CM

## 2013-06-09 MED ORDER — LORAZEPAM 1 MG PO TABS: ORAL_TABLET | ORAL | Status: DC

## 2013-06-09 MED ORDER — PROPRANOLOL HCL 20 MG PO TABS: ORAL_TABLET | ORAL | Status: DC

## 2013-06-09 NOTE — Patient Instructions (Signed)
Stop the diltiazem for now  Take propranolol 20 mg 1 at breakfast and one at supper to try to keep the heart from beating as fast and to decrease the PVCs. This is a very low dose, and will probably need to be increased at a later time, but since you have not tolerated diltiazem and metoprolol I will try this one and a very low dose and I may need to progress upward for control of symptoms  Call the cardiologist and see if you can get in any sooner.  The nodule on your lung is of concern. It may be just fine, but we do not want to miss any kind of tumor growth there. As soon as you're willing for me to schedule the CT scan I will do so. It is my recommendation that we go ahead and get in sooner rather than waiting, I cannot make it now.  Return in 2 weeks for a recheck

## 2013-06-09 NOTE — Progress Notes (Signed)
Subjective: Patient is continued to have a lot of episodes of her heart racing. She doesn't feel like she can tolerate the diltiazem. She strongly wishes to be try something different. She said she has tried this for several days without success. She also in trying to get her cardiology appointment moved up, but currently it is set for January 8. She did not get the CT scan done of her lungs because she really does not want more radiation and wants something about it. Once again I strongly advised her that it would be a good thing to get it. She's not having chest pain, just the racing sensation and weakness he does with that. She's very anxious about it. She's not sleeping at night.  Objective: Pleasant lady well-known to me. Chest is clear. Heart regular without murmurs gallops or arrhythmias. The EKG from a few days ago was reviewed and PVCs noted. I reviewed her hospital record and thyroid studies were good.  Assessment: Palpitations Anxiety Sleep disturbance History of coronary artery disease  Plan: Lorazepam one half of 1 mg in the morning and one at night We'll try propranolol. She has not tolerated a couple of other beta blockers, but we will start a very low dose and then proceeded upward as tolerated.  See the cardiologist sometime seen.  CT scan of chest when patient is willing.

## 2013-06-09 NOTE — Addendum Note (Signed)
Addended by: Maryann Alar on: 06/09/2013 06:23 PM   Modules accepted: Orders, Medications

## 2013-06-10 ENCOUNTER — Telehealth: Payer: Self-pay | Admitting: Cardiology

## 2013-06-10 NOTE — Telephone Encounter (Signed)
I spoke with the pt and she is scheduled to see Dr Shirlee Latch tomorrow for evaluation.  The pt is aware of this appointment. The pt is still having problems with her pulse and palpitations.  The pt was given a Rx for propranolol during 06/09/13 appointment and she has not started this medication at this time. I instructed the pt that she can go ahead and try this medication and keep her scheduled appointment tomorrow.

## 2013-06-10 NOTE — Telephone Encounter (Signed)
New Message  Pt states that she is experiencing side effects ( High BP)  with Medication Losartan// rescheduled appt for 12/30 @ 4:30 PM//SR

## 2013-06-11 ENCOUNTER — Encounter: Payer: Self-pay | Admitting: Cardiology

## 2013-06-11 ENCOUNTER — Ambulatory Visit (INDEPENDENT_AMBULATORY_CARE_PROVIDER_SITE_OTHER): Payer: Medicare Other | Admitting: Cardiology

## 2013-06-11 VITALS — BP 160/62 | HR 84 | Ht <= 58 in | Wt 97.0 lb

## 2013-06-11 DIAGNOSIS — E785 Hyperlipidemia, unspecified: Secondary | ICD-10-CM

## 2013-06-11 DIAGNOSIS — R0989 Other specified symptoms and signs involving the circulatory and respiratory systems: Secondary | ICD-10-CM

## 2013-06-11 DIAGNOSIS — R002 Palpitations: Secondary | ICD-10-CM

## 2013-06-11 DIAGNOSIS — I2510 Atherosclerotic heart disease of native coronary artery without angina pectoris: Secondary | ICD-10-CM

## 2013-06-11 NOTE — Patient Instructions (Addendum)
Take Propranolol 20 mg daily  Your physician has recommended that you wear a holter monitor. Holter monitors are medical devices that record the heart's electrical activity. Doctors most often use these monitors to diagnose arrhythmias. Arrhythmias are problems with the speed or rhythm of the heartbeat. The monitor is a small, portable device. You can wear one while you do your normal daily activities. This is usually used to diagnose what is causing palpitations/syncope (passing out).(24 hour)   Your physician wants you to follow-up in: 1 year You will receive a reminder letter in the mail two months in advance. If you don't receive a letter, please call our office to schedule the follow-up appointment.

## 2013-06-12 ENCOUNTER — Telehealth: Payer: Self-pay | Admitting: Cardiology

## 2013-06-12 ENCOUNTER — Telehealth: Payer: Self-pay | Admitting: *Deleted

## 2013-06-12 MED ORDER — BISOPROLOL FUMARATE 5 MG PO TABS: 2.5000 mg | ORAL_TABLET | Freq: Every day | ORAL | Status: DC

## 2013-06-12 NOTE — Progress Notes (Signed)
Patient ID: [name], female   DOB: 03/11/1946, 67 y.o.   MRN: [id number] PCP: Dr. Algis Downs. [name]  [name] is a 67 y.o. female who presents for follow up.  She was admitted to [facility] 03/2011 with a NSTEMI. LHC demonstrated single vessel CAD with 95% mLAD and this was treated with a Promus DES. EF was preserved. She was reluctant to start most medications. She refused beta blocker due to h/o side effects in the past. She was seen in followup in 11/12.  She continued to note chest pain.  She also noted palpitations.  She complained of significant fatigue and was actually riding a scooter around in a grocery store.  We set her up for a relook heart catheterization in 11/12: LAD stent patent, proximal RCA 25%, EF 65%.  She has not been able to tolerate statins.  She quit taking losartan because she "built up tolerance to it."   Main problem recently has been palpitations.  She has had trouble taking beta blockers.  She did not tolerate diltiazem.  She feels her heart racing at times.  This will last for around 30 seconds.  It scares her.  No lightheadedness or syncope.  She has only very rare, fleeting atypical chest pain.  No exertional dyspnea.  She walks for 1 hour at the mall several times a week.  Her PCP gave her propranolol.  She took one dose without problems but is scared to take any more of it.  Labs (11/12): Potassium 4.2, creatinine 0.2, TC 112, TG 112, HDL 49, LDL 41. Labs (2/13): K 4.1, creatinine 0.73, LDL 84, HDL 59, HBsAB negative, HBsAg positive, HCV negative, AST 43, ALT 67 Labs (4/13): AST 50, ALT 91 Labs (12/14): TSH normal, K 3.6, creatinine 0.7, LDL 101, HDL 52, HCT 35.4  ECG: NSR, PVCs  PMH: 1. GERD with hiatal hernia 2. HBV 3. HTN 4. Hyperlipidemia 5. CAD: NSTEMI 10/12 with 95% mLAD stenosis tx with Promus DES.  EF 60% on LV-gram.  Relook cath in 11/12 with patent LAD stent, EF 65%.   6. Medical noncompliance.  7. Carotid stenosis: carotid dopplers (4/13)  60-79% RICA, 40-59% LICA.  8. Thyroid nodule.  9. PVCs  Current Outpatient Prescriptions  Medication Sig Dispense Refill  . aspirin 81 MG chewable tablet Chew 81 mg by mouth daily.        . nitroGLYCERIN (NITROSTAT) 0.4 MG SL tablet Place 0.4 mg under the tongue every 5 (five) minutes as needed for chest pain.      . bisoprolol (ZEBETA) 5 MG tablet Take 0.5 tablets (2.5 mg total) by mouth daily.  15 tablet  6   No current facility-administered medications for this visit.    Allergies: Allergies  Allergen Reactions  . Atorvastatin Other (See Comments)    Muscle pain  . Crestor [Rosuvastatin] Other (See Comments)    Muscle pain  . Ibuprofen     REACTION: rapid heart rate  . Pravastatin Sodium Other (See Comments)    Muscle cramps/ confusion    History  Substance Use Topics  . Smoking status: Never Smoker   . Smokeless tobacco: Not on file  . Alcohol Use: No   Originally [place]   FH: No premature CAD  ROS:  Please see the history of present illness.   All other systems reviewed and negative.   PHYSICAL EXAM: VS:  BP 160/62  Pulse 84  Ht 4\' 8"  (1.422 m)  Wt 43.999 kg (97 lb)  BMI  21.76 kg/m2 Well nourished, well developed, in no acute distress HEENT: normal Neck: no JVD, nodule in thyroid area.  Cardiac:  normal S1, S2; RRR; 1/6 SEM.  Bilateral carotid bruits.  Lungs:  clear to auscultation bilaterally, no wheezing, rhonchi or rales Abd: soft, nontender, no hepatomegaly Ext: no edema; Right femoral arteriotomy site without hematoma or bruit Skin: warm and dry  Assessment/Plan: 1. CAD: Stable with no ischemic symptoms.  She has refused or not tolerated statins, beta blockers, and ACEI/ARBs.  She is taking ASA 81 mg daily.  2. Hyperlipidemia: Her LDL is not bad considering she is not taking a statin.  She does not want to try another statin.  3. Palpitations: Possibly due to occasional PVCs, versus short runs of SVT.   - Holter monitor to assess for cause  of palpitations. She has them daily so should be able to catch on holter.  - I encouraged her to try taking propranolol 20 mg po bid.  I am somewhat doubtful, however, that she will take a beta blocker long-term.  4. Carotid stenosis: She is due for repeat carotid dopplers.  I will encourage her to schedule them.  [name] 06/12/2013

## 2013-06-12 NOTE — Telephone Encounter (Signed)
Can try bisoprolol 2.5 mg daily.

## 2013-06-12 NOTE — Telephone Encounter (Signed)
Pt called back to verify dosage of medication Bisoprolol that Dr. Shirlee Latch prescribed earlier.  She verbalizes understanding.

## 2013-06-12 NOTE — Telephone Encounter (Signed)
Called stating her PCP (Dr. Alwyn Ren) placed her on Propranolol 2 days ago and saw Dr. Shirlee Latch yesterday.  She has taken the meds for 2 days and last night she had "bad dreams", "head pounding" and trouble sleeping.  Wants to know what medication she can take in place of Propranolol.  Advised will forward to Dr. Shirlee Latch for his review

## 2013-06-12 NOTE — Telephone Encounter (Signed)
New message  Pt called states that she is having problems with her medications// she says that her head is burning with trouble sleeping and Nightmares// please assist. Requests a medications change from Propranolol

## 2013-06-12 NOTE — Telephone Encounter (Addendum)
Spoke with Dr Mclean/ pt requested to try Cardura . Per Dr Shirlee Latch she may try either carvedilol 3.25 mg bid or bisoprolol 2.5 mg. Dr Shirlee Latch will not prescribe Cardura for her/ med is not indicated for her. Pt was informed and will try bisoprolol 2.5 mg daily.

## 2013-06-12 NOTE — Addendum Note (Signed)
Addended by: Antony Odea on: 06/12/2013 02:22 PM   Modules accepted: Orders, Medications

## 2013-06-13 DIAGNOSIS — I6529 Occlusion and stenosis of unspecified carotid artery: Secondary | ICD-10-CM

## 2013-06-13 HISTORY — DX: Occlusion and stenosis of unspecified carotid artery: I65.29

## 2013-06-14 ENCOUNTER — Encounter (INDEPENDENT_AMBULATORY_CARE_PROVIDER_SITE_OTHER): Payer: Medicare Other

## 2013-06-14 ENCOUNTER — Encounter: Payer: Self-pay | Admitting: Radiology

## 2013-06-14 DIAGNOSIS — R002 Palpitations: Secondary | ICD-10-CM

## 2013-06-14 NOTE — Progress Notes (Signed)
Patient ID: [name], female   DOB: March 11, 1946, 68 y.o.   MRN: [id number] 24HR HOLTER MONITOR APPLIED

## 2013-06-20 ENCOUNTER — Ambulatory Visit: Payer: Medicare Other | Admitting: Cardiology

## 2013-06-21 ENCOUNTER — Telehealth: Payer: Self-pay | Admitting: Radiology

## 2013-06-21 NOTE — Telephone Encounter (Signed)
Patient states she has brought in a stool sample and has not gotten any results. She has been advised the hemosure was negative for blood, [name] has advised her of the results.

## 2013-06-24 ENCOUNTER — Other Ambulatory Visit: Payer: Self-pay | Admitting: Cardiology

## 2013-06-27 ENCOUNTER — Telehealth: Payer: Self-pay | Admitting: Cardiovascular Disease

## 2013-06-27 NOTE — Telephone Encounter (Signed)
LMTCB

## 2013-06-27 NOTE — Telephone Encounter (Signed)
New message  Patient is having nose bleeds since Sunday, wants to know if its okay to just take a 1/2 aspirin instead of a whole. Please call patient and advise.

## 2013-06-28 ENCOUNTER — Ambulatory Visit: Payer: Medicare Other | Admitting: Cardiology

## 2013-07-01 ENCOUNTER — Telehealth: Payer: Self-pay | Admitting: Cardiology

## 2013-07-01 NOTE — Telephone Encounter (Signed)
See phone note 06/27/13

## 2013-07-01 NOTE — Telephone Encounter (Signed)
New problem   Pt stated she was returning a call from Dr Claris Gladden nurse. Please call pt

## 2013-07-01 NOTE — Telephone Encounter (Signed)
Pt states she has had a nose bleed for over a week. She thinks it is due to aspirin. She is asking if she can take 1/2 of her 81mg  aspirin daily. I will forward to Dr Aundra Dubin for review.

## 2013-07-02 NOTE — Telephone Encounter (Signed)
Pt advised--agreed with trying saline nasal spray before reducing aspirin dose.

## 2013-07-02 NOTE — Telephone Encounter (Signed)
Would try to stay on ASA 81.  Can use saline nasal spray to keep nasal passages moist.

## 2013-07-04 ENCOUNTER — Telehealth: Payer: Self-pay | Admitting: *Deleted

## 2013-07-04 NOTE — Telephone Encounter (Signed)
I called pt to schedule her carotid dopplers. She states " there is nothing wrong with my arteries they were 40-59 % last time" She has refused to have her carotid doppler performed  Horton Chin RN

## 2013-07-20 ENCOUNTER — Ambulatory Visit (INDEPENDENT_AMBULATORY_CARE_PROVIDER_SITE_OTHER): Payer: Medicare Other | Admitting: Family Medicine

## 2013-07-20 VITALS — BP 128/78 | HR 71 | Temp 98.6°F | Resp 16 | Ht <= 58 in | Wt 97.6 lb

## 2013-07-20 DIAGNOSIS — F418 Other specified anxiety disorders: Secondary | ICD-10-CM

## 2013-07-20 DIAGNOSIS — I10 Essential (primary) hypertension: Secondary | ICD-10-CM

## 2013-07-20 DIAGNOSIS — F411 Generalized anxiety disorder: Secondary | ICD-10-CM

## 2013-07-20 DIAGNOSIS — R002 Palpitations: Secondary | ICD-10-CM

## 2013-07-20 DIAGNOSIS — G4700 Insomnia, unspecified: Secondary | ICD-10-CM

## 2013-07-20 MED ORDER — LOSARTAN POTASSIUM 50 MG PO TABS: 50.0000 mg | ORAL_TABLET | Freq: Every day | ORAL | Status: DC

## 2013-07-20 NOTE — Patient Instructions (Signed)
As you have requested, we can try to discontinue betablocker.  Can restart losartan as you tolerated this before. If this controls your blood pressure, this should also help your palpitations if these occur when your blood pressure is elevated. Restart lorazepam as Dr. Linna Darner recommended - 1/2 at night and in morning if needed for anxiety symptoms. If these anxiety or depression symptoms do not improve with improved sleep - recommend return to discuss other treatment - medication, therapist, or sleep specialist.  If palpitations worsen, chest pain, or any new or worsening symptoms - Return to the clinic, call 911 or go to the nearest emergency room if needed.  Recommend recheck in next 2 weeks to discuss medications as above.

## 2013-07-20 NOTE — Progress Notes (Signed)
Subjective:    Patient ID: [name], female    DOB: 28-May-1946, 68 y.o.   MRN: [id number]  HPI [name] is a 68 y.o. female  Followed most recently by Dr. Linna Darner, and cardiologist, would like other provider opinion tonight.   Tonight - concerns regarding blood pressure medicine - has had problems with beta blockers - "it's going to my head" - feels like medicine is going to head - head feels full, and trouble sleeping. Denies headache, denies slurred speech or focal weakness.  Taking bisoprolol - 1/2 pill once per day at lunch for past month - feels head sensation after taking medicine and continues into evening, and feeling in head interferes with her sleep. Goes to sleep, but wakes up during night. Palpitations at night at times - this has been evaluated by cardiology - told Holter monitor was ok. Told had only occasional PVC's.  Not having palpitations since taking betablocker, but does not want to take any beta blockers. Feels like palpitations only there when has elevated blood pressure. No chest pains.   Lab Results  Component Value Date   TSH 1.688 06/01/2013   Discontinued losartan as "not working anymore after taking for 2 years"- stopped in December.  Help blood pressure, but still had palpitations.  Cough with lisinopril.  Did not tolerate the cardizem - after 4 days - still had palpitations. Tolerated cardizem, but just didn't help palpitations.   Beta blockers help palpitations, but can't sleep due to head symptoms as  above - "all beta blockers".   Asked cardiologist about Cardura - was told not good for her to take this.   Hx of depression, years ago, but not on depression meds recently. Feels like decreased sleep affects her mood - makes feel depressed. Denies SI.  Some anhedonia recently  - feels like staying in bed. Similar symptoms as depression in past, but feels like due to decreased sleep. Feels anxious at times, some worry about not sleeping.      Lorazepam 1mg  - 1/2 am and pm prescribed at 06/09/13 by Dr. Linna Darner. Tried this only twice, because does "not want to become addicted". Did help get to sleep with this medicine. Feels like klonopin caused muscle aches in legs. Last taken Klonopin 10 years ago.     Patient Active Problem List   Diagnosis Date Noted  . Lung nodule, solitary 06/01/2013  . Chest pain 05/31/2013  . Carotid bruit 09/11/2011  . Elevated LFTs 09/11/2011  . Palpitations 04/26/2011  . CAD (coronary artery disease)   . Chest pain 09/15/2010  . Hyperlipidemia 09/15/2010  . Hypertension 09/15/2010  . CHANGE IN BOWELS 08/24/2009   Past Medical History  Diagnosis Date  . GERD (gastroesophageal reflux disease)   . Hiatal hernia   . Arthritis   . HTN (hypertension)   . HLD (hyperlipidemia)   . CAD (coronary artery disease)     s/p NSTEMI 10/12: LHC 04/02/11: mLAD 95%, EF 60%; PCI: Promus DES to mLAD; LHC 04/29/11:   LAD stent patent, proximal RCA 25%, EF 65%   . NSTEMI (non-ST elevated myocardial infarction) 2012  . Multinodular thyroid    Past Surgical History  Procedure Laterality Date  . Cesarean section    . Tonsillectomy    . Hemorroidectomy      1999  . Coronary stent placement  2012   Allergies  Allergen Reactions  . Atorvastatin Other (See Comments)    Muscle pain  . Crestor [Rosuvastatin] Other (See  Comments)    Muscle pain  . Ibuprofen     REACTION: rapid heart rate  . Pravastatin Sodium Other (See Comments)    Muscle cramps/ confusion   Prior to Admission medications   Medication Sig Start Date End Date Taking? Authorizing Provider  aspirin 81 MG chewable tablet Chew 81 mg by mouth daily.     Yes Historical Provider, MD  bisoprolol (ZEBETA) 5 MG tablet Take 0.5 tablets (2.5 mg total) by mouth daily. 06/12/13  Yes Larey Dresser, MD  nitroGLYCERIN (NITROSTAT) 0.4 MG SL tablet Place 0.4 mg under the tongue every 5 (five) minutes as needed for chest pain.   Yes Historical Provider, MD    NITROSTAT 0.4 MG SL tablet TAKE 1 UNDER TONGUE EVERY 5MINS AS NEEDED FOR CHEST PAIN X3 TIMES 06/24/13  Yes Larey Dresser, MD   History   Social History  . Marital Status: Married    Spouse Name: N/A    Number of Children: 1  . Years of Education: N/A   Occupational History  . Not on file.   Social History Main Topics  . Smoking status: Never Smoker   . Smokeless tobacco: Not on file  . Alcohol Use: No  . Drug Use: No  . Sexual Activity: Yes    Birth Control/ Protection: Post-menopausal   Other Topics Concern  . Not on file   Social History Narrative  . No narrative on file       Review of Systems  Respiratory: Negative for cough, chest tightness and shortness of breath.   Cardiovascular: Positive for palpitations (in past - not recently. ).  Neurological: Negative for dizziness, facial asymmetry, speech difficulty, weakness and light-headedness. Headaches: not ha- other head symptoms as above.   Psychiatric/Behavioral: Positive for sleep disturbance. Negative for suicidal ideas. The patient is nervous/anxious (in am - as above, with not sleeping. ).        Objective:   Physical Exam  Vitals reviewed. Constitutional: She is oriented to person, place, and time. She appears well-developed and well-nourished.  HENT:  Head: Normocephalic and atraumatic.  Eyes: Conjunctivae and EOM are normal. Pupils are equal, round, and reactive to light.  Neck: Carotid bruit is not present.  Cardiovascular: Normal rate, regular rhythm, normal heart sounds, intact distal pulses and normal pulses.   No extrasystoles are present. PMI is not displaced.   No murmur heard. Pulmonary/Chest: Effort normal and breath sounds normal.  Abdominal: Soft. She exhibits no pulsatile midline mass. There is no tenderness.  Neurological: She is alert and oriented to person, place, and time.  Skin: Skin is warm and dry.  Psychiatric: She has a normal mood and affect. Her behavior is normal.   Filed  Vitals:   07/20/13 1637  BP: 128/78  Pulse: 71  Temp: 98.6 F (37 C)  TempSrc: Oral  Resp: 16  Height: 4\' 9"  (1.448 m)  Weight: 97 lb 9.6 oz (44.271 kg)  SpO2: 97%      Assessment & Plan:   [name] is a 68 y.o. female HTN (hypertension) - Plan: losartan (COZAAR) 50 MG tablet, DISCONTINUED: losartan (COZAAR) 50 MG tablet  Palpitations  Insomnia  Situational anxiety  Reviewed most recent notes from cardiology and at [facility]. Multiple options discussed for HTN, but she is adamant she does not want to be on betablocker,does not want to take CCB. As she did tolerate ARB for HTN, agreed to try this again at prior dose, as she feels palpitations are  associated more with elevations of BP (did advise that if episodic SVT, may have recurrence off betablocker and to seek care immediately if this were to occur). Agreed to try lorazepam as rx prior if needed for insomnia, but if depressive/anhedonia sx's persist once she is sleeping better, to return to discuss this further, and possible referral to therapist. If difficulty with sleep persists - recommended sleep specialist eval. understanding expressed, answered all questions, and plan above developed with patient.  RT/ER precautions discussed.  Recommended recheck in 2 weeks. Sooner if worse.   Meds ordered this encounter  Medications  . DISCONTD: losartan (COZAAR) 50 MG tablet    Sig: Take 1 tablet (50 mg total) by mouth daily.    Dispense:  30 tablet    Refill:  1  . losartan (COZAAR) 50 MG tablet    Sig: Take 1 tablet (50 mg total) by mouth daily.    Dispense:  30 tablet    Refill:  1   Patient Instructions  As you have requested, we can try to discontinue betablocker.  Can restart losartan as you tolerated this before. If this controls your blood pressure, this should also help your palpitations if these occur when your blood pressure is elevated. Restart lorazepam as Dr. Linna Darner recommended - 1/2 at night and in morning if  needed for anxiety symptoms. If these anxiety or depression symptoms do not improve with improved sleep - recommend return to discuss other treatment - medication, therapist, or sleep specialist.  If palpitations worsen, chest pain, or any new or worsening symptoms - Return to the clinic, call 911 or go to the nearest emergency room if needed.  Recommend recheck in next 2 weeks to discuss medications as above.     approx 25 minutes in history and exam, discussion of meds and plan.

## 2013-07-22 ENCOUNTER — Telehealth: Payer: Self-pay | Admitting: Cardiology

## 2013-07-22 DIAGNOSIS — I10 Essential (primary) hypertension: Secondary | ICD-10-CM

## 2013-07-22 NOTE — Telephone Encounter (Signed)
That is ok 

## 2013-07-22 NOTE — Telephone Encounter (Signed)
Pt went to Urgent Care 07/20/13 because she felt like beta blocker was causing her to sleep too much. Beta blocker was changed to losartan 50mg  at patient's request. Pt asking if OK to start losartan 50mg  (1/2) daily instead of losartan 50mg  daily. I will forward to Dr Aundra Dubin for review.

## 2013-07-22 NOTE — Telephone Encounter (Signed)
Pt advised OK to take 1/2 of 50mg  losartan instead of 1

## 2013-07-22 NOTE — Telephone Encounter (Signed)
New Message  Pt called states that her medication is giving her a hard time with sleeping// She went to the urgent care and they placed her on losartan and she is requesting a call back to determine if it is OK for her to take 50 mg cut in half/ Please call

## 2013-08-09 ENCOUNTER — Ambulatory Visit (INDEPENDENT_AMBULATORY_CARE_PROVIDER_SITE_OTHER): Payer: Medicare Other | Admitting: Family Medicine

## 2013-08-09 VITALS — BP 130/70 | HR 91 | Temp 97.9°F | Resp 16 | Ht <= 58 in | Wt 96.0 lb

## 2013-08-09 DIAGNOSIS — I10 Essential (primary) hypertension: Secondary | ICD-10-CM

## 2013-08-09 MED ORDER — LOSARTAN POTASSIUM-HCTZ 50-12.5 MG PO TABS: 1.0000 | ORAL_TABLET | Freq: Every day | ORAL | Status: DC

## 2013-08-09 NOTE — Patient Instructions (Signed)
1.  Return in one month for blood pressure recheck. 2.  Bring your blood pressure machine to next visit.

## 2013-08-10 LAB — COMPREHENSIVE METABOLIC PANEL
ALK PHOS: 58 U/L (ref 39–117)
ALT: 17 U/L (ref 0–35)
AST: 19 U/L (ref 0–37)
Albumin: 4.6 g/dL (ref 3.5–5.2)
BILIRUBIN TOTAL: 0.3 mg/dL (ref 0.2–1.2)
BUN: 19 mg/dL (ref 6–23)
CO2: 26 meq/L (ref 19–32)
CREATININE: 0.85 mg/dL (ref 0.50–1.10)
Calcium: 9.3 mg/dL (ref 8.4–10.5)
Chloride: 104 mEq/L (ref 96–112)
Glucose, Bld: 117 mg/dL — ABNORMAL HIGH (ref 70–99)
Potassium: 4.2 mEq/L (ref 3.5–5.3)
SODIUM: 141 meq/L (ref 135–145)
Total Protein: 7.6 g/dL (ref 6.0–8.3)

## 2013-08-14 ENCOUNTER — Telehealth: Payer: Self-pay

## 2013-08-14 NOTE — Telephone Encounter (Signed)
Patient is calling to get results of her potassium tests.  [phone]

## 2013-08-15 NOTE — Telephone Encounter (Signed)
Pt advised of results. 

## 2013-10-04 ENCOUNTER — Ambulatory Visit (INDEPENDENT_AMBULATORY_CARE_PROVIDER_SITE_OTHER): Payer: Medicare Other | Admitting: Emergency Medicine

## 2013-10-04 VITALS — BP 122/60 | HR 92 | Temp 98.2°F | Resp 16 | Ht <= 58 in | Wt 98.2 lb

## 2013-10-04 DIAGNOSIS — L729 Follicular cyst of the skin and subcutaneous tissue, unspecified: Secondary | ICD-10-CM

## 2013-10-04 DIAGNOSIS — L723 Sebaceous cyst: Secondary | ICD-10-CM

## 2013-10-04 NOTE — Progress Notes (Signed)
Subjective:    Patient ID: [name], female    DOB: Oct 28, 1945, 68 y.o.   MRN: [id number] This chart was scribed for [name] Queen, MD by Rolanda Lundborg, ED Scribe. This patient was seen in room 14 and the patient's care was started at 12:42 PM.  Chief Complaint  Patient presents with  . Cyst    right foot x 3 years    HPI HPI Comments: [name] is a 68 y.o. female who presents to the [facility] complaining of a constant unchanged cyst in the right popliteal area onset 3 years ago. She has tried soaking it in hot water with no relief. Pt states she would like to have it removed.  PCP - Ruben Reason, MD  Past Medical History  Diagnosis Date  . GERD (gastroesophageal reflux disease)   . Hiatal hernia   . Arthritis   . HTN (hypertension)   . HLD (hyperlipidemia)   . CAD (coronary artery disease)     s/p NSTEMI 10/12: LHC 04/02/11: mLAD 95%, EF 60%; PCI: Promus DES to mLAD; LHC 04/29/11:   LAD stent patent, proximal RCA 25%, EF 65%   . NSTEMI (non-ST elevated myocardial infarction) 2012  . Multinodular thyroid    Current Outpatient Prescriptions on File Prior to Visit  Medication Sig Dispense Refill  . aspirin 81 MG chewable tablet Chew 81 mg by mouth daily.        [name] losartan (COZAAR) 50 MG tablet 1/2 tablet      . losartan-hydrochlorothiazide (HYZAAR) 50-12.5 MG per tablet Take 1 tablet by mouth daily.  30 tablet  5  . nitroGLYCERIN (NITROSTAT) 0.4 MG SL tablet Place 0.4 mg under the tongue every 5 (five) minutes as needed for chest pain.      [name] NITROSTAT 0.4 MG SL tablet TAKE 1 UNDER TONGUE EVERY 5MINS AS NEEDED FOR CHEST PAIN X3 TIMES  25 tablet  2   No current facility-administered medications on file prior to visit.   Allergies  Allergen Reactions  . Atorvastatin Other (See Comments)    Muscle pain  . Crestor [Rosuvastatin] Other (See Comments)    Muscle pain  . Ibuprofen     REACTION: rapid heart rate  . Pravastatin Sodium  Other (See Comments)    Muscle cramps/ confusion      Review of Systems  Constitutional: Negative for fever and chills.  Gastrointestinal: Negative for vomiting.  Skin: Negative for rash.  Neurological: Negative for numbness.       Objective:   Physical Exam CONSTITUTIONAL: Well developed/well nourished HEAD: Normocephalic/atraumatic EYES: EOMI/PERRL ENMT: Mucous membranes moist NECK: supple no meningeal signs SPINE:entire spine nontender CV: S1/S2 noted, no murmurs/rubs/gallops noted LUNGS: Lungs are clear to auscultation bilaterally, no apparent distress ABDOMEN: soft, nontender, no rebound or guarding GU:no cva tenderness NEURO: Pt is awake/alert, moves all extremitiesx4 EXTREMITIES: pulses normal, full ROM SKIN: warm, color normal. 0.5 cm freely movable nodule in the right popliteal area. Overlying skin is normal. PSYCH: no abnormalities of mood noted   Filed Vitals:   10/04/13 1242  BP: 122/60  Pulse: 92  Temp: 98.2 F (36.8 C)  TempSrc: Oral  Resp: 16  Height: 4\' 7"  (1.397 m)  Weight: 98 lb 3.2 oz (44.543 kg)  SpO2: 98%        Assessment & Plan:  **Disclaimer: This note was dictated with voice recognition software. Similar sounding words can inadvertently be transcribed and this note may contain transcription errors which may  not have been corrected upon publication of note.**  I personally performed the services described in this documentation, which was scribed in my presence. The recorded information has been reviewed and is accurate.  This is most likely a small epidermoid cyst. Will refer to general surgery for removal.

## 2013-10-06 ENCOUNTER — Other Ambulatory Visit: Payer: Self-pay | Admitting: Family Medicine

## 2013-10-06 NOTE — Telephone Encounter (Signed)
Meds ordered this encounter  Medications  . losartan (COZAAR) 50 MG tablet    Sig: TAKE 1 TABLET (50 MG TOTAL) BY MOUTH DAILY.    Dispense:  30 tablet    Refill:  1

## 2013-10-06 NOTE — Telephone Encounter (Signed)
Pt was advised for follow up in March. RTC in April for a different concern bp was WNL. Please advise refill.

## 2013-10-23 ENCOUNTER — Ambulatory Visit (INDEPENDENT_AMBULATORY_CARE_PROVIDER_SITE_OTHER): Payer: Medicare Other | Admitting: General Surgery

## 2013-10-31 ENCOUNTER — Ambulatory Visit (INDEPENDENT_AMBULATORY_CARE_PROVIDER_SITE_OTHER): Payer: Medicare Other | Admitting: General Surgery

## 2013-10-31 ENCOUNTER — Encounter (INDEPENDENT_AMBULATORY_CARE_PROVIDER_SITE_OTHER): Payer: Self-pay | Admitting: General Surgery

## 2013-10-31 VITALS — BP 130/64 | HR 80 | Temp 97.1°F | Resp 16 | Ht <= 58 in | Wt 99.6 lb

## 2013-10-31 DIAGNOSIS — R229 Localized swelling, mass and lump, unspecified: Secondary | ICD-10-CM

## 2013-10-31 NOTE — Progress Notes (Signed)
Chief complaint: Mass right leg  History: Patient is a pleasant 68 year old female referred by Dr. Everlene Farrier for a subcutaneous mass on her right lower extremity.  She states this is been present about 2 years. She feels it has recently enlarged slightly. There is no pain. No other areas on her body  Past Medical History  Diagnosis Date  . GERD (gastroesophageal reflux disease)   . Hiatal hernia   . Arthritis   . HTN (hypertension)   . HLD (hyperlipidemia)   . CAD (coronary artery disease)     s/p NSTEMI 10/12: LHC 04/02/11: mLAD 95%, EF 60%; PCI: Promus DES to mLAD; LHC 04/29/11:   LAD stent patent, proximal RCA 25%, EF 65%   . NSTEMI (non-ST elevated myocardial infarction) 2012  . Multinodular thyroid   possible pulmonary nodule seen on recent chest x-ray  Past Surgical History  Procedure Laterality Date  . Cesarean section    . Tonsillectomy    . Hemorroidectomy      1999  . Coronary stent placement  2012   Current Outpatient Prescriptions  Medication Sig Dispense Refill  . aspirin 81 MG chewable tablet Chew 81 mg by mouth daily.        [name] losartan (COZAAR) 50 MG tablet 1/2 tablet      . nitroGLYCERIN (NITROSTAT) 0.4 MG SL tablet Place 0.4 mg under the tongue every 5 (five) minutes as needed for chest pain.      [name] NITROSTAT 0.4 MG SL tablet TAKE 1 UNDER TONGUE EVERY 5MINS AS NEEDED FOR CHEST PAIN X3 TIMES  25 tablet  2  . LORazepam (ATIVAN) 1 MG tablet       . losartan (COZAAR) 50 MG tablet TAKE 1 TABLET (50 MG TOTAL) BY MOUTH DAILY.  30 tablet  1  . losartan-hydrochlorothiazide (HYZAAR) 50-12.5 MG per tablet Take 1 tablet by mouth daily.  30 tablet  5   No current facility-administered medications for this visit.   Allergies  Allergen Reactions  . Atorvastatin Other (See Comments)    Muscle pain  . Crestor [Rosuvastatin] Other (See Comments)    Muscle pain  . Ibuprofen     REACTION: rapid heart rate  . Pravastatin Sodium Other (See Comments)    Muscle cramps/ confusion    Exam: BP 130/64  Pulse 80  Temp(Src) 97.1 F (36.2 C) (Temporal)  Resp 16  Ht 4\' 8"  (1.422 m)  Wt 99 lb 9.6 oz (45.178 kg)  BMI 22.34 kg/m2 General: Petite Asian female no distress Skin: No rash or infections C. Extremities Lungs: Clear equal breath sounds bilaterally Cardiac: Regular rate and rhythm. No edema Extremities: No edema. On the right lower extremity posteriorly behind the knee joint he has a 1 cm soft subcutaneous mass possibly consistent with a fibroma or lipoma.  Assessment and plan: Subcutaneous mass behind right knee. This is enlarging per patient. Likely benign. As it is enlarging I recommended excision for diagnosis and treatment. We plan to do this under local anesthesia. Hold aspirin for 5 days. Chest x-ray had showed a possible pulmonary nodule and CT was recommended. She is concerned about radiation and we discussed that she could see a pulmonologist prior to CT which is what she would prefer. I gave her possible physicians and she will arrange this referral on her own. I do not think this needs to delay removal of her subcutaneous nodule.

## 2013-11-12 ENCOUNTER — Telehealth (INDEPENDENT_AMBULATORY_CARE_PROVIDER_SITE_OTHER): Payer: Self-pay

## 2013-11-12 ENCOUNTER — Other Ambulatory Visit (INDEPENDENT_AMBULATORY_CARE_PROVIDER_SITE_OTHER): Payer: Self-pay | Admitting: General Surgery

## 2013-11-12 DIAGNOSIS — D1739 Benign lipomatous neoplasm of skin and subcutaneous tissue of other sites: Secondary | ICD-10-CM

## 2013-11-12 NOTE — Telephone Encounter (Addendum)
F/U call patient states her B/P 200/90 she decided NOT to go to the hospital because she had took nitro and she  would be ok. Patient does have someone with her . Advised her to call 911 if symptoms does not improve or becomes worse .

## 2013-11-12 NOTE — Telephone Encounter (Signed)
Patient states she just took a NITRO and her leg is numb. Patient speech is slurred. I advised her to call 911, not sure if she understood. I ask her was anyone with her. I did not get an answer. Speech remained slurred. I told to call 911, I told her I would call for her. After calling 911 . Patient with slurred speech states she will be ok ,she does not know if she is having a heart attack or not but she did not want to pay for a ambulance. She then states its here. I told her to open the door for them. She said ok then hung up.[name]

## 2013-11-28 ENCOUNTER — Encounter (INDEPENDENT_AMBULATORY_CARE_PROVIDER_SITE_OTHER): Payer: Self-pay | Admitting: General Surgery

## 2013-11-28 ENCOUNTER — Ambulatory Visit (INDEPENDENT_AMBULATORY_CARE_PROVIDER_SITE_OTHER): Payer: Medicare Other | Admitting: General Surgery

## 2013-11-28 VITALS — BP 122/78 | HR 75 | Temp 97.4°F | Ht <= 58 in | Wt 99.0 lb

## 2013-11-28 DIAGNOSIS — Z09 Encounter for follow-up examination after completed treatment for conditions other than malignant neoplasm: Secondary | ICD-10-CM

## 2013-11-28 NOTE — Progress Notes (Signed)
History: Patient returns following removal of a subcutaneous mass from behind her knee. She reports no problems.  Exam: The wound is very nicely healed and sutures are removed and the wound Steri-Stripped.  Pathology showed benign lipoma  Assessment and plan: Doing well. She will leave the strips on for one week. Return as needed.

## 2013-12-05 ENCOUNTER — Other Ambulatory Visit: Payer: Self-pay | Admitting: Physician Assistant

## 2013-12-05 ENCOUNTER — Other Ambulatory Visit: Payer: Self-pay | Admitting: Family Medicine

## 2013-12-05 ENCOUNTER — Ambulatory Visit (INDEPENDENT_AMBULATORY_CARE_PROVIDER_SITE_OTHER): Payer: Medicare Other | Admitting: Family Medicine

## 2013-12-05 VITALS — BP 124/72 | HR 83 | Temp 98.8°F | Resp 18 | Ht <= 58 in | Wt 100.0 lb

## 2013-12-05 DIAGNOSIS — I2510 Atherosclerotic heart disease of native coronary artery without angina pectoris: Secondary | ICD-10-CM

## 2013-12-05 DIAGNOSIS — R911 Solitary pulmonary nodule: Secondary | ICD-10-CM

## 2013-12-05 DIAGNOSIS — E785 Hyperlipidemia, unspecified: Secondary | ICD-10-CM

## 2013-12-05 DIAGNOSIS — I10 Essential (primary) hypertension: Secondary | ICD-10-CM

## 2013-12-05 DIAGNOSIS — E042 Nontoxic multinodular goiter: Secondary | ICD-10-CM

## 2013-12-05 LAB — POCT URINALYSIS DIPSTICK
Bilirubin, UA: NEGATIVE
Glucose, UA: NEGATIVE
KETONES UA: NEGATIVE
Leukocytes, UA: NEGATIVE
Nitrite, UA: NEGATIVE
PROTEIN UA: NEGATIVE
RBC UA: NEGATIVE
Urobilinogen, UA: 0.2
pH, UA: 5

## 2013-12-05 MED ORDER — LORAZEPAM 0.5 MG PO TABS: 0.5000 mg | ORAL_TABLET | Freq: Every day | ORAL | Status: DC

## 2013-12-05 MED ORDER — LOSARTAN POTASSIUM 50 MG PO TABS: 50.0000 mg | ORAL_TABLET | Freq: Every day | ORAL | Status: DC

## 2013-12-05 NOTE — Progress Notes (Signed)
Subjective:  This chart was scribed for Reginia Forts, MD by Donato Schultz, Medical Scribe. This patient was seen in Room 3 and the patient's care was started at 5:17 PM.   Patient ID: [name], female    DOB: Jul 31, 1945, 68 y.o.   MRN: [id number]  HPI HPI Comments: [name] is a 68 y.o. female with a history of hypertension and CAD who presents to the [facility] needing a refill of her Lorazepam prescription.  She states that Dr. Linna Darner prescribed 1 mg number 20 of Lorazepam with one refill on 06/09/2013.  She states that she only takes it when she cannot sleep.  She states that she will not take it every week and only uses it sparingly.  The patient states that she has only gotten 4 hours of sleep over the past 3 days.  She denies chest pain, SOB, hematochezia, decreased appetite, and productive cough as associated symptoms.  The patient believes that the Losartan has been inhibiting her sleep but she has not stopped taking the medication.  She states that she takes 50 mg of Losartan daily.  She states that she is taking Aspirin regularly but does not like how it makes her bruise.  The patient has a history of MI in 2012 and she has had a stent placement since that incident.  The patient states that she gardens in the morning which serves as her exercise. Management made at last visit includes changing Losartan 50mg  daily to Losartan/HCTZ 50/12.5 daily; did not tolerate diuretic due to excessive urination.  The patient's mother died at 22 years old of heart problems and did not have any other health problems.  The patient's father died at 3 years old of bladder cancer and did not have any other health problems.  She states that he was a heavy drinker.  She states that she has two sisters.  She states that one sister died at 62 years old of DM and did not have any other health problems.  She states that her other sisters are still living and do not have any  health problems.   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  She states that she retired at 68 years old.  The patient states that she has one child and has been married for 31 years.  The patient's child is 21 years old and she does not have any grandchildren.  She states that she moved to the [place] in 1984 from the [place] and she currently lives with her husband.  The patient denies having a history of smoking or alcohol use.    She states that she last had a CAT scan in 2012 on her head and she does not want another one performed.  The patient states that she had a biopsy of her thyroid within the last year that was negative for a thyroid nodule.   She states that she has never had a mammogram and she does not want one.  The patient denies having a history of colonoscopy.  She denies having received a pneumonia shot and she would not like to receive one.   During admission in 05/2013, a RUL nodule was found on CXR. CT chest recommended but patient refused due to radiation exposure.    Past Medical History  Diagnosis Date  . GERD (gastroesophageal reflux disease)   . Hiatal hernia   . Arthritis   . HTN (hypertension)   . HLD (hyperlipidemia)   .  CAD (coronary artery disease)     s/p NSTEMI 10/12: LHC 04/02/11: mLAD 95%, EF 60%; PCI: Promus DES to mLAD; LHC 04/29/11:   LAD stent patent, proximal RCA 25%, EF 65%   . NSTEMI (non-ST elevated myocardial infarction) 2012  . Multinodular thyroid    Past Surgical History  Procedure Laterality Date  . Cesarean section    . Tonsillectomy    . Hemorroidectomy      1999  . Coronary stent placement  2012   Family History  Problem Relation Age of Onset  . Prostate cancer Father   . Cancer Father 16    bladder cancer  . Alcohol abuse Father   . Diabetes Sister   . Diabetes Sister    History   Social History  . Marital Status: Married    Spouse Name: N/A    Number of Children: 1  . Years of Education: N/A    Occupational History  . 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   Social History Main Topics  . Smoking status: Never Smoker   . Smokeless tobacco: Never Used  . Alcohol Use: No  . Drug Use: No  . Sexual Activity: Yes    Birth Control/ Protection: Post-menopausal   Other Topics Concern  . Not on file   Social History Narrative   Marital status: married x 31 years.  From [place]; to [place] 1984.      Children:  1 child (30); no grandchildren.      Lives: with husband.      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      Tobacco: never      Alcohol: none       Exercise: works in the garden.    Allergies  Allergen Reactions  . Atorvastatin Other (See Comments)    Muscle pain  . Crestor [Rosuvastatin] Other (See Comments)    Muscle pain  . Ibuprofen     REACTION: rapid heart rate  . Pravastatin Sodium Other (See Comments)    Muscle cramps/ confusion    Review of Systems  Constitutional: Negative for fever, chills, diaphoresis, appetite change and fatigue.  HENT: Negative for congestion.   Eyes: Negative for visual disturbance.  Respiratory: Positive for cough. Negative for shortness of breath.   Cardiovascular: Negative for chest pain, palpitations and leg swelling.  Gastrointestinal: Negative for nausea, vomiting, abdominal pain, diarrhea, constipation, blood in stool and anal bleeding.  Endocrine: Negative for polydipsia, polyphagia and polyuria.  Skin: Negative for wound.  Neurological: Negative for dizziness, tremors, seizures, syncope, speech difficulty, weakness, light-headedness, numbness and headaches.  Psychiatric/Behavioral: Positive for sleep disturbance. Negative for dysphoric mood. The patient is not nervous/anxious.   All other systems reviewed and are negative.    Objective:  Physical Exam  Nursing note and vitals reviewed. Constitutional: She is oriented to person, place, and time. She appears well-developed and well-nourished.  HENT:   Head: Normocephalic and atraumatic.  Mouth/Throat: Oropharynx is clear and moist. No oropharyngeal exudate.  Eyes: EOM are normal. Pupils are equal, round, and reactive to light.  Neck: Normal range of motion. Neck supple. Carotid bruit is present. No tracheal deviation present. No thyromegaly present.  Mild bilateral bruits.   Cardiovascular: Normal rate, regular rhythm, normal heart sounds and intact distal pulses.  Exam reveals no gallop and no friction rub.   No murmur heard. Pulmonary/Chest: Effort normal and breath sounds normal. No respiratory distress. She has no wheezes. She has no rales.  Musculoskeletal:  Normal range of motion.  Lymphadenopathy:    She has no cervical adenopathy.  Neurological: She is alert and oriented to person, place, and time.  Skin: Skin is warm and dry.  Psychiatric: She has a normal mood and affect. Her behavior is normal.     BP 124/72  Pulse 83  Temp(Src) 98.8 F (37.1 C) (Oral)  Resp 18  Ht 4\' 8"  (1.422 m)  Wt 100 lb (45.36 kg)  BMI 22.43 kg/m2  SpO2 98% Assessment & Plan:  Essential hypertension - Plan: CBC with Differential, Comprehensive metabolic panel, POCT urinalysis dipstick  Hyperlipidemia - Plan: Comprehensive metabolic panel, Lipid panel  Coronary artery disease involving native coronary artery of native heart without angina pectoris  Lung nodule, solitary  Multinodular goiter  1. HTN: controlled; obtain labs; refill of Losartan 50mg  daily provided. 2.  Hyperlipidemia: stable; intolerant to multiple statins; obtain labs. 3.  CAD: stable; asymptomatic currently; recommended continued use of ARB; cannot tolerate B blockers or ACE-I.  Recommend pt to restart ASA 81mg  daily. 4.  Lung nodule:  New in 05/2013; pt declined repeat CXR today; declined CT chest today as well.  5.  Multinodular goiter: New to this provider; with single nodule s/p biopsy that was negative.  Meds ordered this encounter  Medications  . DISCONTD:  LORazepam (ATIVAN) 1 MG tablet    Sig: Take 1 mg by mouth at bedtime.   [name] losartan (COZAAR) 50 MG tablet    Sig: Take 1 tablet (50 mg total) by mouth daily.    Dispense:  30 tablet    Refill:  5  . LORazepam (ATIVAN) 0.5 MG tablet    Sig: Take 1 tablet (0.5 mg total) by mouth at bedtime.    Dispense:  30 tablet    Refill:  1   I personally performed the services described in this documentation, which was scribed in my presence.  The recorded information has been reviewed and is accurate.   Reginia Forts, M.D.  [facility] [address] [place], [place]  [address] [phone] phone [phone] fax

## 2013-12-06 LAB — COMPREHENSIVE METABOLIC PANEL
ALT: 19 U/L (ref 0–35)
AST: 22 U/L (ref 0–37)
Albumin: 4.8 g/dL (ref 3.5–5.2)
Alkaline Phosphatase: 54 U/L (ref 39–117)
BUN: 18 mg/dL (ref 6–23)
CALCIUM: 9.4 mg/dL (ref 8.4–10.5)
CHLORIDE: 104 meq/L (ref 96–112)
CO2: 26 mEq/L (ref 19–32)
Creat: 0.8 mg/dL (ref 0.50–1.10)
Glucose, Bld: 84 mg/dL (ref 70–99)
Potassium: 4.1 mEq/L (ref 3.5–5.3)
Sodium: 139 mEq/L (ref 135–145)
Total Bilirubin: 0.6 mg/dL (ref 0.2–1.2)
Total Protein: 7.6 g/dL (ref 6.0–8.3)

## 2013-12-06 LAB — CBC WITH DIFFERENTIAL/PLATELET
BASOS ABS: 0.1 10*3/uL (ref 0.0–0.1)
Basophils Relative: 1 % (ref 0–1)
EOS ABS: 0 10*3/uL (ref 0.0–0.7)
EOS PCT: 0 % (ref 0–5)
HEMATOCRIT: 37.8 % (ref 36.0–46.0)
Hemoglobin: 13 g/dL (ref 12.0–15.0)
LYMPHS PCT: 23 % (ref 12–46)
Lymphs Abs: 1.6 10*3/uL (ref 0.7–4.0)
MCH: 30.8 pg (ref 26.0–34.0)
MCHC: 34.4 g/dL (ref 30.0–36.0)
MCV: 89.6 fL (ref 78.0–100.0)
MONO ABS: 0.5 10*3/uL (ref 0.1–1.0)
Monocytes Relative: 8 % (ref 3–12)
Neutro Abs: 4.6 10*3/uL (ref 1.7–7.7)
Neutrophils Relative %: 68 % (ref 43–77)
Platelets: 429 10*3/uL — ABNORMAL HIGH (ref 150–400)
RBC: 4.22 MIL/uL (ref 3.87–5.11)
RDW: 13.4 % (ref 11.5–15.5)
WBC: 6.8 10*3/uL (ref 4.0–10.5)

## 2013-12-06 LAB — LIPID PANEL
CHOLESTEROL: 207 mg/dL — AB (ref 0–200)
HDL: 70 mg/dL (ref >39)
LDL Cholesterol: 117 mg/dL — ABNORMAL HIGH (ref 0–99)
Total CHOL/HDL Ratio: 3 Ratio
Triglycerides: 98 mg/dL (ref <150)
VLDL: 20 mg/dL (ref 0–40)

## 2013-12-06 NOTE — Telephone Encounter (Signed)
Appears Loarazpam was taken care of by Dr. Tamala Julian last night.

## 2013-12-20 ENCOUNTER — Encounter (INDEPENDENT_AMBULATORY_CARE_PROVIDER_SITE_OTHER): Payer: Medicare Other | Admitting: General Surgery

## 2014-01-30 ENCOUNTER — Other Ambulatory Visit: Payer: Self-pay | Admitting: Cardiology

## 2014-01-30 ENCOUNTER — Ambulatory Visit (INDEPENDENT_AMBULATORY_CARE_PROVIDER_SITE_OTHER): Payer: Medicare Other | Admitting: Family Medicine

## 2014-01-30 VITALS — BP 158/82 | HR 78 | Temp 98.4°F | Resp 16 | Ht <= 58 in | Wt 102.0 lb

## 2014-01-30 DIAGNOSIS — I10 Essential (primary) hypertension: Secondary | ICD-10-CM

## 2014-01-30 DIAGNOSIS — R61 Generalized hyperhidrosis: Secondary | ICD-10-CM

## 2014-01-30 DIAGNOSIS — I2510 Atherosclerotic heart disease of native coronary artery without angina pectoris: Secondary | ICD-10-CM

## 2014-01-30 LAB — POCT URINALYSIS DIPSTICK
Bilirubin, UA: NEGATIVE
GLUCOSE UA: NEGATIVE
Ketones, UA: 15
NITRITE UA: NEGATIVE
Protein, UA: NEGATIVE
Spec Grav, UA: 1.02
Urobilinogen, UA: 0.2
pH, UA: 6

## 2014-01-30 LAB — POCT CBC
Granulocyte percent: 64.1 %G (ref 37–80)
HCT, POC: 41.8 % (ref 37.7–47.9)
Hemoglobin: 13.2 g/dL (ref 12.2–16.2)
Lymph, poc: 2.2 (ref 0.6–3.4)
MCH, POC: 29.5 pg (ref 27–31.2)
MCHC: 31.5 g/dL — AB (ref 31.8–35.4)
MCV: 93.6 fL (ref 80–97)
MID (cbc): 0.4 (ref 0–0.9)
MPV: 8.2 fL (ref 0–99.8)
PLATELET COUNT, POC: 387 10*3/uL (ref 142–424)
POC Granulocyte: 4.7 (ref 2–6.9)
POC LYMPH PERCENT: 30.1 %L (ref 10–50)
POC MID %: 5.8 % (ref 0–12)
RBC: 4.47 M/uL (ref 4.04–5.48)
RDW, POC: 13.7 %
WBC: 7.4 10*3/uL (ref 4.6–10.2)

## 2014-01-30 LAB — POCT UA - MICROSCOPIC ONLY
Bacteria, U Microscopic: NEGATIVE
CASTS, UR, LPF, POC: NEGATIVE
Crystals, Ur, HPF, POC: NEGATIVE
Mucus, UA: NEGATIVE
Yeast, UA: NEGATIVE

## 2014-01-30 LAB — GLUCOSE, POCT (MANUAL RESULT ENTRY): POC Glucose: 97 mg/dl (ref 70–99)

## 2014-01-30 NOTE — Progress Notes (Signed)
Subjective:  This chart was scribed for Reginia Forts, MD by Mercy Moore, Medial Scribe. This patient was seen in room 2 and the patient's care was started at 9:04 AM.    Patient ID: [name], female    DOB: Dec 06, 1945, 68 y.o.   MRN: [id number]  01/30/2014  Dizziness, sweats and Extremity Weakness   HPI HPI Comments: [name] is a 68 y.o. female who presents to the [facility] complaining of diaphoresis.  Woke up in middle of night with excessive sweating.  Took blanket off.  Has air conditioner; rarely wakes up sweating.  Worried that Losartan was cause of symptoms.  No sweating today.  Worried about recurrent AMI; yet no chest pain, shortness of breath, nausea.  Did not sleep much last night; does not feel good.  Able to eat but small amount today due to fatigue/malaise.  No dizziness.  Feeling weak all over; head is full and heavy which she feels is due to lack of sleep but also expresses concern that Losartan is causing head heaviness.  No headache; +lack of sleep last night.  +Cold sweat last night.  No fever/chills.  No ear pain or sore throat.  Mild rhinorrhea but chronic.  Scant cough.  No v/d.  No dysuria, urgency, frequency, nocturia that is worsening.  Worried about sugar or potassium level being abnormal.  Checked BP at home today and was 156/68.  Last cardiology follow-up was 06/11/13.  Wants CK-MB checked as well.   Review of Systems  Constitutional: Positive for diaphoresis. Negative for fever, chills and fatigue.  HENT: Negative for congestion, ear pain, postnasal drip, rhinorrhea and sore throat.   Eyes: Negative for photophobia and visual disturbance.  Respiratory: Negative for shortness of breath, wheezing and stridor.   Cardiovascular: Negative for palpitations and leg swelling.  Gastrointestinal: Negative for nausea, vomiting, diarrhea, constipation and abdominal distention.  Genitourinary: Negative for dysuria, frequency and  hematuria.  Neurological: Positive for weakness. Negative for dizziness, tremors, seizures, syncope, facial asymmetry, speech difficulty, light-headedness, numbness and headaches.    Past Medical History  Diagnosis Date  . GERD (gastroesophageal reflux disease)   . Hiatal hernia   . Arthritis   . HTN (hypertension)   . HLD (hyperlipidemia)   . CAD (coronary artery disease)     s/p NSTEMI 10/12: LHC 04/02/11: mLAD 95%, EF 60%; PCI: Promus DES to mLAD; LHC 04/29/11:   LAD stent patent, proximal RCA 25%, EF 65%   . NSTEMI (non-ST elevated myocardial infarction) 2012  . Multinodular thyroid    Past Surgical History  Procedure Laterality Date  . Cesarean section    . Tonsillectomy    . Hemorroidectomy      1999  . Coronary stent placement  2012   Allergies  Allergen Reactions  . Atorvastatin Other (See Comments)    Muscle pain  . Crestor [Rosuvastatin] Other (See Comments)    Muscle pain  . Ibuprofen     REACTION: rapid heart rate  . Pravastatin Sodium Other (See Comments)    Muscle cramps/ confusion   Current Outpatient Prescriptions  Medication Sig Dispense Refill  . aspirin 81 MG chewable tablet Chew 81 mg by mouth daily.        [name] losartan (COZAAR) 50 MG tablet Take 1 tablet (50 mg total) by mouth daily.  30 tablet  5  . nitroGLYCERIN (NITROSTAT) 0.4 MG SL tablet Place 0.4 mg under the tongue every 5 (five) minutes as needed for  chest pain.       No current facility-administered medications for this visit.   History   Social History  . Marital Status: Married    Spouse Name: N/A    Number of Children: 1  . Years of Education: N/A   Occupational History  . 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   Social History Main Topics  . Smoking status: Never Smoker   . Smokeless tobacco: Never Used  . Alcohol Use: No  . Drug Use: No  . Sexual Activity: Yes    Birth Control/ Protection: Post-menopausal   Other Topics Concern  . Not on file   Social History  Narrative   Marital status: married x 31 years.  From [place]; to [place] 1984.      Children:  1 child (30); no grandchildren.      Lives: with husband.      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      Tobacco: never      Alcohol: none       Exercise: works in the garden.        Objective:    BP 158/82  Pulse 78  Temp(Src) 98.4 F (36.9 C) (Oral)  Resp 16  Ht 4\' 8"  (1.422 m)  Wt 102 lb (46.267 kg)  BMI 22.88 kg/m2  SpO2 98%  Physical Exam  Nursing note and vitals reviewed. Constitutional: She is oriented to person, place, and time. She appears well-developed and well-nourished. No distress.  HENT:  Head: Normocephalic and atraumatic.  Right Ear: External ear normal.  Left Ear: External ear normal.  Nose: Nose normal.  Mouth/Throat: Oropharynx is clear and moist.  Eyes: Conjunctivae and EOM are normal. Pupils are equal, round, and reactive to light.  Neck: Normal range of motion. Neck supple. Carotid bruit is not present. No thyromegaly present.  Cardiovascular: Normal rate, regular rhythm, normal heart sounds and intact distal pulses.  Exam reveals no gallop and no friction rub.   No murmur heard. Pulmonary/Chest: Effort normal and breath sounds normal. She has no wheezes. She has no rales.  Abdominal: Soft. Bowel sounds are normal. She exhibits no distension and no mass. There is no tenderness. There is no rebound and no guarding.  Musculoskeletal: Normal range of motion.  Lymphadenopathy:    She has no cervical adenopathy.  Neurological: She is alert and oriented to person, place, and time. No cranial nerve deficit.  Skin: Skin is warm and dry. No rash noted. She is not diaphoretic. No erythema. No pallor.  Psychiatric: She has a normal mood and affect. Her behavior is normal.   Results for orders placed in visit on 01/30/14  POCT CBC      Result Value Ref Range   WBC 7.4  4.6 - 10.2 K/uL   Lymph, poc 2.2  0.6 - 3.4   POC LYMPH PERCENT 30.1  10 - 50 %L    MID (cbc) 0.4  0 - 0.9   POC MID % 5.8  0 - 12 %M   POC Granulocyte 4.7  2 - 6.9   Granulocyte percent 64.1  37 - 80 %G   RBC 4.47  4.04 - 5.48 M/uL   Hemoglobin 13.2  12.2 - 16.2 g/dL   HCT, POC 41.8  37.7 - 47.9 %   MCV 93.6  80 - 97 fL   MCH, POC 29.5  27 - 31.2 pg   MCHC 31.5 (*) 31.8 - 35.4 g/dL   RDW, POC  13.7     Platelet Count, POC 387  142 - 424 K/uL   MPV 8.2  0 - 99.8 fL  GLUCOSE, POCT (MANUAL RESULT ENTRY)      Result Value Ref Range   POC Glucose 97  70 - 99 mg/dl  POCT URINALYSIS DIPSTICK      Result Value Ref Range   Color, UA yellow     Clarity, UA clear     Glucose, UA neg     Bilirubin, UA neg     Ketones, UA 15     Spec Grav, UA 1.020     Blood, UA trace-lysed     pH, UA 6.0     Protein, UA neg     Urobilinogen, UA 0.2     Nitrite, UA neg     Leukocytes, UA small (1+)    POCT UA - MICROSCOPIC ONLY      Result Value Ref Range   WBC, Ur, HPF, POC 0-4     RBC, urine, microscopic 0-1     Bacteria, U Microscopic neg     Mucus, UA neg     Epithelial cells, urine per micros 0-1     Crystals, Ur, HPF, POC neg     Casts, Ur, LPF, POC neg     Yeast, UA neg     EKG: NSR; no acute changes.    Assessment & Plan:   1. Diaphoresis   2. Essential hypertension   3. Coronary artery disease involving native coronary artery of native heart without angina pectoris    1. Diaphoresis:  New onset last night; no associated symptoms.  Stable EKG; normal glucose; normal urine. Obtain BMET.  To ED for associated chest pain. Reassurance provided; recommend follow-up with cardiology now to further reassure patient.  RTC if symptoms persist or worsen. 2.  HTN: moderately controlled.  Advised to continue Losartan at current dose. 3.  CAD: stable; no evidence of AMI at this time.  Encourage continued Losartan use and ASA use.    No orders of the defined types were placed in this encounter.    No Follow-up on file.   I personally performed the services described in this  documentation, which was scribed in my presence. The recorded information has been reviewed and is accurate.  Reginia Forts, M.D.  [facility] [address] [place], [place]  [address] [phone] phone [phone] fax

## 2014-01-31 ENCOUNTER — Emergency Department (HOSPITAL_COMMUNITY)
Admission: EM | Admit: 2014-01-31 | Discharge: 2014-01-31 | Disposition: A | Discharge status: Home / Self Care | Payer: Medicare Other | Attending: Emergency Medicine | Admitting: Emergency Medicine

## 2014-01-31 ENCOUNTER — Encounter (HOSPITAL_COMMUNITY): Payer: Self-pay | Admitting: Emergency Medicine

## 2014-01-31 DIAGNOSIS — Z862 Personal history of diseases of the blood and blood-forming organs and certain disorders involving the immune mechanism: Secondary | ICD-10-CM | POA: Diagnosis not present

## 2014-01-31 DIAGNOSIS — I10 Essential (primary) hypertension: Secondary | ICD-10-CM | POA: Insufficient documentation

## 2014-01-31 DIAGNOSIS — I252 Old myocardial infarction: Secondary | ICD-10-CM | POA: Diagnosis not present

## 2014-01-31 DIAGNOSIS — Z7982 Long term (current) use of aspirin: Secondary | ICD-10-CM | POA: Diagnosis not present

## 2014-01-31 DIAGNOSIS — Z79899 Other long term (current) drug therapy: Secondary | ICD-10-CM | POA: Diagnosis not present

## 2014-01-31 DIAGNOSIS — R52 Pain, unspecified: Secondary | ICD-10-CM | POA: Insufficient documentation

## 2014-01-31 DIAGNOSIS — Z9861 Coronary angioplasty status: Secondary | ICD-10-CM | POA: Diagnosis not present

## 2014-01-31 DIAGNOSIS — M129 Arthropathy, unspecified: Secondary | ICD-10-CM | POA: Diagnosis not present

## 2014-01-31 DIAGNOSIS — I2510 Atherosclerotic heart disease of native coronary artery without angina pectoris: Secondary | ICD-10-CM | POA: Insufficient documentation

## 2014-01-31 DIAGNOSIS — Z8639 Personal history of other endocrine, nutritional and metabolic disease: Secondary | ICD-10-CM | POA: Insufficient documentation

## 2014-01-31 DIAGNOSIS — R61 Generalized hyperhidrosis: Secondary | ICD-10-CM | POA: Insufficient documentation

## 2014-01-31 LAB — I-STAT CHEM 8, ED
BUN: 9 mg/dL (ref 6–23)
CALCIUM ION: 1.12 mmol/L — AB (ref 1.13–1.30)
Chloride: 105 mEq/L (ref 96–112)
Creatinine, Ser: 0.8 mg/dL (ref 0.50–1.10)
GLUCOSE: 105 mg/dL — AB (ref 70–99)
HCT: 38 % (ref 36.0–46.0)
Hemoglobin: 12.9 g/dL (ref 12.0–15.0)
POTASSIUM: 3.6 meq/L — AB (ref 3.7–5.3)
SODIUM: 140 meq/L (ref 137–147)
TCO2: 25 mmol/L (ref 0–100)

## 2014-01-31 LAB — CBC WITH DIFFERENTIAL/PLATELET
BASOS PCT: 1 % (ref 0–1)
Basophils Absolute: 0 10*3/uL (ref 0.0–0.1)
EOS ABS: 0.1 10*3/uL (ref 0.0–0.7)
Eosinophils Relative: 1 % (ref 0–5)
HCT: 35.2 % — ABNORMAL LOW (ref 36.0–46.0)
HEMOGLOBIN: 11.4 g/dL — AB (ref 12.0–15.0)
Lymphocytes Relative: 36 % (ref 12–46)
Lymphs Abs: 1.8 10*3/uL (ref 0.7–4.0)
MCH: 29.8 pg (ref 26.0–34.0)
MCHC: 32.4 g/dL (ref 30.0–36.0)
MCV: 91.9 fL (ref 78.0–100.0)
MONOS PCT: 9 % (ref 3–12)
Monocytes Absolute: 0.4 10*3/uL (ref 0.1–1.0)
NEUTROS ABS: 2.6 10*3/uL (ref 1.7–7.7)
NEUTROS PCT: 53 % (ref 43–77)
PLATELETS: 339 10*3/uL (ref 150–400)
RBC: 3.83 MIL/uL — ABNORMAL LOW (ref 3.87–5.11)
RDW: 12.4 % (ref 11.5–15.5)
WBC: 4.9 10*3/uL (ref 4.0–10.5)

## 2014-01-31 LAB — URINALYSIS, ROUTINE W REFLEX MICROSCOPIC
BILIRUBIN URINE: NEGATIVE
GLUCOSE, UA: NEGATIVE mg/dL
HGB URINE DIPSTICK: NEGATIVE
Ketones, ur: NEGATIVE mg/dL
Leukocytes, UA: NEGATIVE
Nitrite: NEGATIVE
Protein, ur: NEGATIVE mg/dL
Specific Gravity, Urine: 1.005 (ref 1.005–1.030)
UROBILINOGEN UA: 0.2 mg/dL (ref 0.0–1.0)
pH: 7.5 (ref 5.0–8.0)

## 2014-01-31 LAB — COMPREHENSIVE METABOLIC PANEL
ALBUMIN: 4.8 g/dL (ref 3.5–5.2)
ALT: 20 U/L (ref 0–35)
AST: 22 U/L (ref 0–37)
Alkaline Phosphatase: 48 U/L (ref 39–117)
BILIRUBIN TOTAL: 0.6 mg/dL (ref 0.2–1.2)
BUN: 10 mg/dL (ref 6–23)
CO2: 27 mEq/L (ref 19–32)
Calcium: 9.3 mg/dL (ref 8.4–10.5)
Chloride: 105 mEq/L (ref 96–112)
Creat: 0.83 mg/dL (ref 0.50–1.10)
Glucose, Bld: 113 mg/dL — ABNORMAL HIGH (ref 70–99)
POTASSIUM: 3.9 meq/L (ref 3.5–5.3)
Sodium: 142 mEq/L (ref 135–145)
Total Protein: 7.4 g/dL (ref 6.0–8.3)

## 2014-01-31 LAB — I-STAT TROPONIN, ED: Troponin i, poc: 0 ng/mL (ref 0.00–0.08)

## 2014-01-31 NOTE — Discharge Instructions (Signed)
Diaphoresis Sweating is controlled by our nervous system. Sweat glands are found in the skin throughout the body. They exist in higher numbers in the skin of the hands, feet, armpits, and the genital region. Sweating occurs normally when the temperature of your body goes up. Diaphoresis means profuse sweating or perspiration due to an underlying medical condition or an external factor (such as medicines). Hyperhidrosis means excessive sweating that is not usually due to an underlying medical condition, on areas such as the palms, soles, or armpits. Other areas of the body may also be affected. Hyperhidrosis usually begins in childhood or early adolescence. It increases in severity through puberty and into adulthood. Sweaty palms are the most common problem and the most bothersome to people who have hyperhidrosis. CAUSES  Sweating is normally seen with exercise or being in hot surroundings. Not sweating in these conditions would be harmful. Stressful situations can also cause sweating. In some people, stimulation of the sweat glands during stress is overactive. Talking to strangers or shaking someone's hand can produce profuse sweating. Causes of sweating include:  Emotional upset.  Low blood pressure.  Low blood sugar.  Heart problems.  Low blood cell counts.  Certain pain relieving medicines.  Exercise.  Alcohol.  Infection.  Caffeine.  Spicy foods.  Hot flashes.  Overactive thyroid.  Illegal drug use, such as cocaine and amphetamine.  Use of medicines that stimulate parts of the nervous system.  A tumor (pheochromocytoma).  Withdrawal from some medicines or alcohol. DIAGNOSIS  Your caregiver needs to be consulted to make sure excessive sweating is not caused by another condition. Further testing may need to be done. TREATMENT   When hyperhidrosis is caused by another condition, that condition should be treated.  If menopause is the cause, you may wish to talk to your  caregiver about estrogen replacement.  If the hyperhidrosis is a natural happening of the way your body works, certain antiperspirants may help.  If medicines do not work, injections of botulinum toxin type A are sometimes used for underarm sweating.  Your caregiver can usually help you with this problem. Document Released: 12/20/2004 Document Revised: 08/22/2011 Document Reviewed: 07/07/2008 Exit[employer] Patient Information 2015 [employer]. This information is not intended to replace advice given to you by your health care provider. Make sure you discuss any questions you have with your health care provider.  

## 2014-01-31 NOTE — ED Notes (Signed)
Pt reports for the past 2 nights after taking her Losartan she has been waking up hot and sweaty, denies changes in medicaiton- pt also reports feeling weak over the past 2 days.  Denies CP or SOB.  Neuro exam negative.  Pt states she took a Nitroglycerin at 0130 "to bring my blood pressure down"- denies chest pain.

## 2014-01-31 NOTE — ED Provider Notes (Signed)
CSN: [id number]     Arrival date & time 01/31/14  0205 History   First MD Initiated Contact with Patient 01/31/14 0256     Chief Complaint  Patient presents with  . Generalized Body Aches     (Consider location/radiation/quality/duration/timing/severity/associated sxs/prior Treatment) HPI  [name] is a 68 y.o. female who presents for evaluation of feeling hot and sweaty. She had an episode yesterday, and one this morning. Both episodes occurred, after she was asleep, was awakened by feeling. She denies other symptoms. She states that she are saw the doctor at the clinic, but they could not find anything wrong. She believes that it is related to her losartan. She denies fever, chills, cough, shortness of breath, chest pain, nausea, or vomiting. No dysuria, urinary frequency, or diarrhea. She is taking her usual medications without relief. This morning, she took a sublingual nitroglycerin, because she thought her blood pressure was elevated. There are no other known modifying factors.   Past Medical History  Diagnosis Date  . GERD (gastroesophageal reflux disease)   . Hiatal hernia   . Arthritis   . HTN (hypertension)   . HLD (hyperlipidemia)   . CAD (coronary artery disease)     s/p NSTEMI 10/12: LHC 04/02/11: mLAD 95%, EF 60%; PCI: Promus DES to mLAD; LHC 04/29/11:   LAD stent patent, proximal RCA 25%, EF 65%   . NSTEMI (non-ST elevated myocardial infarction) 2012  . Multinodular thyroid    Past Surgical History  Procedure Laterality Date  . Cesarean section    . Tonsillectomy    . Hemorroidectomy      1999  . Coronary stent placement  2012   Family History  Problem Relation Age of Onset  . Prostate cancer Father   . Cancer Father 33    bladder cancer  . Alcohol abuse Father   . Diabetes Sister   . Diabetes Sister    History  Substance Use Topics  . Smoking status: Never Smoker   . Smokeless tobacco: Never Used  . Alcohol Use: No   OB History   Grav Para  Term Preterm Abortions TAB SAB Ect Mult Living                 Review of Systems  All other systems reviewed and are negative.     Allergies  Atorvastatin; Crestor; Ibuprofen; and Pravastatin sodium  Home Medications   Prior to Admission medications   Medication Sig Start Date End Date Taking? Authorizing Provider  aspirin 81 MG chewable tablet Chew 81 mg by mouth daily.     Yes Historical Provider, MD  losartan (COZAAR) 50 MG tablet Take 1 tablet (50 mg total) by mouth daily. 12/05/13  Yes Wardell Honour, MD  nitroGLYCERIN (NITROSTAT) 0.4 MG SL tablet Place 0.4 mg under the tongue every 5 (five) minutes as needed for chest pain.   Yes Historical Provider, MD   BP 141/65  Pulse 65  Temp(Src) 98 F (36.7 C) (Oral)  Resp 18  Ht 4\' 8"  (1.422 m)  Wt 102 lb (46.267 kg)  BMI 22.88 kg/m2  SpO2 98% Physical Exam  Nursing note and vitals reviewed. Constitutional: She is oriented to person, place, and time. She appears well-developed and well-nourished. She appears distressed (she is uncomfortable).  HENT:  Head: Normocephalic and atraumatic.  Eyes: Conjunctivae and EOM are normal. Pupils are equal, round, and reactive to light.  Neck: Normal range of motion and phonation normal. Neck supple.  Cardiovascular: Normal rate,  regular rhythm and intact distal pulses.   Pulmonary/Chest: Effort normal and breath sounds normal. She exhibits no tenderness.  Abdominal: Soft. She exhibits no distension. There is no tenderness. There is no guarding.  Musculoskeletal: Normal range of motion.  Neurological: She is alert and oriented to person, place, and time. She exhibits normal muscle tone.  Skin: Skin is warm and dry.  Psychiatric: Her behavior is normal. Judgment and thought content normal.  She is anxious    ED Course  Procedures (including critical care time) Medications - No data to display  Patient Vitals for the past 24 hrs:  BP Temp Temp src Pulse Resp SpO2 Height Weight   01/31/14 0415 141/65 mmHg - - 65 18 98 % - -  01/31/14 0315 131/70 mmHg - - 67 30 98 % - -  01/31/14 0300 149/66 mmHg - - 68 12 99 % - -  01/31/14 0213 152/70 mmHg 98 F (36.7 C) Oral 77 18 99 % 4\' 8"  (1.422 m) 102 lb (46.267 kg)    4:53 AM Reevaluation with update and discussion. After initial assessment and treatment, an updated evaluation reveals no further c/o, findings discussed with pt. All questions answered.Daleen Bo L     Labs Review Labs Reviewed  CBC WITH DIFFERENTIAL - Abnormal; Notable for the following:    RBC 3.83 (*)    Hemoglobin 11.4 (*)    HCT 35.2 (*)    All other components within normal limits  I-STAT CHEM 8, ED - Abnormal; Notable for the following:    Potassium 3.6 (*)    Glucose, Bld 105 (*)    Calcium, Ion 1.12 (*)    All other components within normal limits  URINE CULTURE  URINALYSIS, ROUTINE W REFLEX MICROSCOPIC  I-STAT TROPOININ, ED    Imaging Review No results found.   EKG Interpretation   Date/Time:  Friday January 31 2014 02:28:15 EDT Ventricular Rate:  71 PR Interval:  136 QRS Duration: 66 QT Interval:  394 QTC Calculation: 428 R Axis:   77 Text Interpretation:  Normal sinus rhythm Nonspecific T wave abnormality  Abnormal ECG since last tracing no significant change Confirmed by Eulis Foster   MD, Alyan Hartline ([id number]) on 01/31/2014 2:56:40 AM      MDM   Final diagnoses:  Diaphoresis    Nonspecific episode of diaphoresis. Medical stability is suggested by negative findings on ED evaluation. There is no indication that the patient is having an adverse reaction to her losartan, which is what she suspects.  Nursing Notes Reviewed/ Care Coordinated Applicable Imaging Reviewed Interpretation of Laboratory Data incorporated into ED treatment  The patient appears reasonably screened and/or stabilized for discharge and I doubt any other medical condition or other [facility] requiring further screening, evaluation, or treatment in the ED at this  time prior to discharge.  Plan: Home Medications- usual; Home Treatments- rest; return here if the recommended treatment, does not improve the symptoms; Recommended follow up- PCP, when necessary     Richarda Blade, MD 01/31/14 [phone]

## 2014-02-01 LAB — URINE CULTURE

## 2014-02-02 ENCOUNTER — Ambulatory Visit (INDEPENDENT_AMBULATORY_CARE_PROVIDER_SITE_OTHER): Payer: Medicare Other | Admitting: Emergency Medicine

## 2014-02-02 VITALS — BP 160/84 | HR 85 | Temp 98.1°F | Resp 16 | Ht <= 58 in | Wt 99.2 lb

## 2014-02-02 DIAGNOSIS — I10 Essential (primary) hypertension: Secondary | ICD-10-CM

## 2014-02-02 DIAGNOSIS — R61 Generalized hyperhidrosis: Secondary | ICD-10-CM

## 2014-02-02 MED ORDER — AMLODIPINE BESYLATE 2.5 MG PO TABS: 2.5000 mg | ORAL_TABLET | Freq: Every day | ORAL | Status: DC

## 2014-02-02 NOTE — Progress Notes (Signed)
Subjective:    Patient ID: [name], female    DOB: [birth year], 68 y.o.   MRN: [id number] This chart was scribed for [place]. Everlene Farrier, MD by Steva Colder, ED Scribe. The patient was seen in room 5 at 3:47 PM.   Chief Complaint  Patient presents with  . Medication Dose Change    wants to change meds    HPI [name] is a 68 y.o. female with a medical hx of CAD and HTN who presents today complaining of medication dose change. She states that she wants to change her Losartan. She states that she thought that she was having a heart attack and she went to the hospital on 01/31/14 and was informed that she she did not have a heart attack. She states that she went to the ED because she was sweating a lot. She states that she can not take a Beta blocker. She states that she coughs with lisinopril. She states that with the beta blocker, she will have insomnia. She states that she didn't take the Losartan today yet.   She denies any other associated symptoms. She states that she has not taken amlodipine.   Pt BP taken while in the office and it was 160/70 on both arms.   Patient Active Problem List   Diagnosis Date Noted  . Lung nodule, solitary 06/01/2013  . Chest pain 05/31/2013  . Carotid bruit 09/11/2011  . Elevated LFTs 09/11/2011  . Palpitations 04/26/2011  . CAD (coronary artery disease)   . Chest pain 09/15/2010  . Hyperlipidemia 09/15/2010  . Hypertension 09/15/2010  . CHANGE IN BOWELS 08/24/2009   Past Medical History  Diagnosis Date  . GERD (gastroesophageal reflux disease)   . Hiatal hernia   . Arthritis   . HTN (hypertension)   . HLD (hyperlipidemia)   . CAD (coronary artery disease)     s/p NSTEMI 10/12: LHC 04/02/11: mLAD 95%, EF 60%; PCI: Promus DES to mLAD; LHC 04/29/11:   LAD stent patent, proximal RCA 25%, EF 65%   . NSTEMI (non-ST elevated myocardial infarction) 2012  . Multinodular thyroid    Past Surgical History  Procedure Laterality Date    . Cesarean section    . Tonsillectomy    . Hemorroidectomy      1999  . Coronary stent placement  2012   Allergies  Allergen Reactions  . Atorvastatin Other (See Comments)    Muscle pain  . Crestor [Rosuvastatin] Other (See Comments)    Muscle pain  . Ibuprofen     REACTION: rapid heart rate  . Pravastatin Sodium Other (See Comments)    Muscle cramps/ confusion   Prior to Admission medications   Medication Sig Start Date End Date Taking? Authorizing Provider  aspirin 81 MG chewable tablet Chew 81 mg by mouth daily.     Yes Historical Provider, MD  losartan (COZAAR) 50 MG tablet Take 1 tablet (50 mg total) by mouth daily. 12/05/13  Yes Wardell Honour, MD  nitroGLYCERIN (NITROSTAT) 0.4 MG SL tablet Place 0.4 mg under the tongue every 5 (five) minutes as needed for chest pain.   Yes Historical Provider, MD  NITROSTAT 0.4 MG SL tablet TAKE 1 UNDER TONGUE EVERY 5MINS AS NEEDED FOR CHEST PAIN X3 TIMES 01/31/14  Yes Larey Dresser, MD      Review of Systems  Constitutional:       Pt wants a change of medication.        Objective:  Physical Exam  Nursing note and vitals reviewed. Constitutional: She is oriented to person, place, and time. She appears well-developed and well-nourished. No distress.  HENT:  Head: Normocephalic and atraumatic.  Eyes: EOM are normal.  Neck: Neck supple. No tracheal deviation present.  Cardiovascular: Normal rate.   Pulmonary/Chest: Effort normal. No respiratory distress.  Musculoskeletal: Normal range of motion.  Neurological: She is alert and oriented to person, place, and time.  Skin: Skin is warm and dry.  Psychiatric: She has a normal mood and affect. Her behavior is normal.      BP 160/84  Pulse 85  Temp(Src) 98.1 F (36.7 C) (Oral)  Resp 16  Ht 4\' 8"  (1.422 m)  Wt 99 lb 3.2 oz (44.997 kg)  BMI 22.25 kg/m2  SpO2 100%  Assessment & Plan:  DIAGNOSTIC STUDIES: Oxygen Saturation is 100% on room air, normal by my interpretation.     COORDINATION OF CARE: 3:56 PM-Discussed treatment plan which includes blood pressure medication change to Amlodipine with pt at bedside and pt agreed to plan.  Patient is convinced the losartan  is causing her to sweat. And she wants to change her medication. I will give her a trial of amlodipine 2.5 and she wants to make an appointment to see her cardiologist to discuss. I told her the losartan was not causing her to sweat but she is convinced that it is causing problems. I personally performed the services described in this documentation, which was scribed in my presence. The recorded information has been reviewed and is accurate.

## 2014-02-03 ENCOUNTER — Telehealth: Payer: Self-pay | Admitting: Cardiology

## 2014-02-03 ENCOUNTER — Encounter: Payer: Self-pay | Admitting: *Deleted

## 2014-02-03 DIAGNOSIS — I6529 Occlusion and stenosis of unspecified carotid artery: Secondary | ICD-10-CM

## 2014-02-03 NOTE — Telephone Encounter (Signed)
Pt requesting to make an appt for a carotid doppler.  She states she missed her appt she had scheduled and she is overdue to have this done. Pt advised I will send message to PCP to call her to schedule carotid doppler.

## 2014-02-03 NOTE — Telephone Encounter (Signed)
New message          Pt would like to set up a time to talk to dr Aundra Dubin today in office / Is this possible?

## 2014-02-03 NOTE — Telephone Encounter (Signed)
F/u ° ° °Pt returning your call. Please call pt. °

## 2014-02-03 NOTE — Telephone Encounter (Signed)
Pt advised Dr Aundra Dubin will call her tomorrow.

## 2014-02-03 NOTE — Telephone Encounter (Signed)
Pt requesting to talk to Dr Aundra Dubin today about losartan.  She prefers to talk with Dr Aundra Dubin.

## 2014-02-03 NOTE — Telephone Encounter (Signed)
LMTCB

## 2014-02-03 NOTE — Telephone Encounter (Signed)
New message          C/o not sleeping and has question about medication losartan

## 2014-02-03 NOTE — Telephone Encounter (Signed)
Pt advised I do not see losartan on her medication list.  Pt requesting carotid doppler appt.

## 2014-02-04 NOTE — Telephone Encounter (Signed)
Patient is going to cut back losartan to 12.5 mg daily.  She says that BP is running 120/70 and losartan causes her to have trouble sleeping.

## 2014-02-07 NOTE — Telephone Encounter (Signed)
Noted on medication lsit

## 2014-02-10 ENCOUNTER — Ambulatory Visit (HOSPITAL_COMMUNITY): Payer: Medicare Other | Attending: Cardiology | Admitting: Cardiology

## 2014-02-10 DIAGNOSIS — I2510 Atherosclerotic heart disease of native coronary artery without angina pectoris: Secondary | ICD-10-CM | POA: Insufficient documentation

## 2014-02-10 DIAGNOSIS — I10 Essential (primary) hypertension: Secondary | ICD-10-CM | POA: Insufficient documentation

## 2014-02-10 DIAGNOSIS — E785 Hyperlipidemia, unspecified: Secondary | ICD-10-CM | POA: Diagnosis not present

## 2014-02-10 DIAGNOSIS — I6529 Occlusion and stenosis of unspecified carotid artery: Secondary | ICD-10-CM | POA: Diagnosis not present

## 2014-02-10 NOTE — Progress Notes (Signed)
Carotid duplex performed 

## 2014-02-11 ENCOUNTER — Ambulatory Visit (INDEPENDENT_AMBULATORY_CARE_PROVIDER_SITE_OTHER): Payer: Medicare Other | Admitting: Family Medicine

## 2014-02-11 VITALS — BP 100/66 | HR 87 | Temp 98.0°F | Resp 16 | Ht <= 58 in | Wt 99.2 lb

## 2014-02-11 DIAGNOSIS — R079 Chest pain, unspecified: Secondary | ICD-10-CM

## 2014-02-11 DIAGNOSIS — R911 Solitary pulmonary nodule: Secondary | ICD-10-CM

## 2014-02-11 DIAGNOSIS — R002 Palpitations: Secondary | ICD-10-CM

## 2014-02-11 DIAGNOSIS — I10 Essential (primary) hypertension: Secondary | ICD-10-CM

## 2014-02-11 DIAGNOSIS — R81 Glycosuria: Secondary | ICD-10-CM

## 2014-02-11 LAB — POCT CBC
Granulocyte percent: 79.9 %G (ref 37–80)
HEMATOCRIT: 39.9 % (ref 37.7–47.9)
Hemoglobin: 12.7 g/dL (ref 12.2–16.2)
LYMPH, POC: 1.4 (ref 0.6–3.4)
MCH, POC: 30 pg (ref 27–31.2)
MCHC: 31.8 g/dL (ref 31.8–35.4)
MCV: 94.2 fL (ref 80–97)
MID (cbc): 0.3 (ref 0–0.9)
MPV: 8.6 fL (ref 0–99.8)
POC GRANULOCYTE: 6.8 (ref 2–6.9)
POC LYMPH %: 16.7 % (ref 10–50)
POC MID %: 3.4 %M (ref 0–12)
Platelet Count, POC: 407 10*3/uL (ref 142–424)
RBC: 4.24 M/uL (ref 4.04–5.48)
RDW, POC: 13.2 %
WBC: 8.5 10*3/uL (ref 4.6–10.2)

## 2014-02-11 LAB — POCT UA - MICROSCOPIC ONLY
BACTERIA, U MICROSCOPIC: NEGATIVE
CASTS, UR, LPF, POC: NEGATIVE
Crystals, Ur, HPF, POC: NEGATIVE
Mucus, UA: NEGATIVE
RBC, URINE, MICROSCOPIC: NEGATIVE
Yeast, UA: NEGATIVE

## 2014-02-11 LAB — COMPREHENSIVE METABOLIC PANEL
ALBUMIN: 4.7 g/dL (ref 3.5–5.2)
ALK PHOS: 52 U/L (ref 39–117)
ALT: 17 U/L (ref 0–35)
AST: 19 U/L (ref 0–37)
BUN: 11 mg/dL (ref 6–23)
CO2: 29 mEq/L (ref 19–32)
CREATININE: 0.75 mg/dL (ref 0.50–1.10)
Calcium: 9.3 mg/dL (ref 8.4–10.5)
Chloride: 102 mEq/L (ref 96–112)
GLUCOSE: 95 mg/dL (ref 70–99)
POTASSIUM: 3.8 meq/L (ref 3.5–5.3)
Sodium: 139 mEq/L (ref 135–145)
Total Bilirubin: 0.4 mg/dL (ref 0.2–1.2)
Total Protein: 7.6 g/dL (ref 6.0–8.3)

## 2014-02-11 LAB — POCT URINALYSIS DIPSTICK
BILIRUBIN UA: NEGATIVE
GLUCOSE UA: 100
KETONES UA: 15
Leukocytes, UA: NEGATIVE
NITRITE UA: NEGATIVE
PH UA: 6
Protein, UA: NEGATIVE
RBC UA: NEGATIVE
Spec Grav, UA: <=1.005
Urobilinogen, UA: 0.2

## 2014-02-11 LAB — TROPONIN I: TROPONIN I: 0.01 ng/mL (ref <0.06)

## 2014-02-11 LAB — GLUCOSE, POCT (MANUAL RESULT ENTRY): POC GLUCOSE: 101 mg/dL — AB (ref 70–99)

## 2014-02-11 LAB — POCT GLYCOSYLATED HEMOGLOBIN (HGB A1C): HEMOGLOBIN A1C: 5.1

## 2014-02-11 MED ORDER — LISINOPRIL 5 MG PO TABS: 2.5000 mg | ORAL_TABLET | Freq: Every day | ORAL | Status: DC

## 2014-02-11 NOTE — Progress Notes (Signed)
Subjective:    Patient ID: [name], female    DOB: 1946-05-19, 68 y.o.   MRN: [id number] Chief Complaint  Patient presents with  . discuss medication    would like to change back to lisinopril 2.5mg  norvasc causing burning in head and dizziness   . Dizziness    HPI  Can check BP at home but did not check this morning as she woke up feeling lightheaded, dizzy, weak.  No HA, vision change, f/c, no vertigo. Has had some mild chest tightness. No pain. Has not taken any nitroglycerin Used to take lisinopril 2.5 mg and she would like to do this.  It previously caused a cough. BB caused trouble sleeping. She has been seen twice in clinic in the past 10d and once in the ER due to her concern that she is having sweats with losartan.  She saw Dr. Tamala Julian and the Er doctor who r/o acute MI. She continued with concerns that her losartan was causing night sweats so came in to see Dr. Everlene Farrier who agreed to let pt stop losartan 25 and start amlodipine 2.5.  Then pt called her cardiologist Dr. Aundra Dubin who had pt try 1/2 tab of losartan to = 2.5 mg daily.  Pt has not taken any of these medications this morning due to sxs. Had carotid dopplers done yesterday  Past Medical History  Diagnosis Date  . GERD (gastroesophageal reflux disease)   . Hiatal hernia   . Arthritis   . HTN (hypertension)   . HLD (hyperlipidemia)   . CAD (coronary artery disease)     s/p NSTEMI 10/12: LHC 04/02/11: mLAD 95%, EF 60%; PCI: Promus DES to mLAD; LHC 04/29/11:   LAD stent patent, proximal RCA 25%, EF 65%   . NSTEMI (non-ST elevated myocardial infarction) 2012  . Multinodular thyroid    Current Outpatient Prescriptions on File Prior to Visit  Medication Sig Dispense Refill  . nitroGLYCERIN (NITROSTAT) 0.4 MG SL tablet Place 0.4 mg under the tongue every 5 (five) minutes as needed for chest pain.       No current facility-administered medications on file prior to visit.   Allergies  Allergen Reactions  .  Atorvastatin Other (See Comments)    Muscle pain  . Crestor [Rosuvastatin] Other (See Comments)    Muscle pain  . Ibuprofen     REACTION: rapid heart rate  . Pravastatin Sodium Other (See Comments)    Muscle cramps/ confusion     Review of Systems  Constitutional: Negative for fever and chills.  Eyes: Negative for photophobia, pain and visual disturbance.  Respiratory: Positive for chest tightness. Negative for cough, shortness of breath and wheezing.   Cardiovascular: Negative for chest pain, palpitations and leg swelling.  Endocrine: Positive for heat intolerance.  Musculoskeletal: Positive for arthralgias and myalgias.  Neurological: Positive for dizziness, weakness and light-headedness. Negative for headaches.  Psychiatric/Behavioral: Positive for sleep disturbance.       Objective:  BP 100/66  Pulse 87  Temp(Src) 98 F (36.7 C) (Oral)  Resp 16  Ht 4\' 8"  (1.422 m)  Wt 99 lb 3.2 oz (44.997 kg)  BMI 22.25 kg/m2  SpO2 99%  Physical Exam  Constitutional: She is oriented to person, place, and time. She appears well-developed and well-nourished. No distress.  HENT:  Head: Normocephalic and atraumatic.  Right Ear: External ear normal.  Left Ear: External ear normal.  Eyes: Conjunctivae are normal. No scleral icterus.  Neck: Normal range of motion. Neck supple. No  thyromegaly present.  Cardiovascular: Normal rate, regular rhythm, normal heart sounds and intact distal pulses.   Pulmonary/Chest: Effort normal and breath sounds normal. No respiratory distress.  Musculoskeletal: She exhibits no edema.  Lymphadenopathy:    She has no cervical adenopathy.  Neurological: She is alert and oriented to person, place, and time.  Skin: Skin is warm and dry. She is not diaphoretic. No erythema.  Psychiatric: She has a normal mood and affect.     Results for orders placed in visit on 02/11/14  POCT CBC      Result Value Ref Range   WBC 8.5  4.6 - 10.2 K/uL   Lymph, poc 1.4  0.6  - 3.4   POC LYMPH PERCENT 16.7  10 - 50 %L   MID (cbc) 0.3  0 - 0.9   POC MID % 3.4  0 - 12 %M   POC Granulocyte 6.8  2 - 6.9   Granulocyte percent 79.9  37 - 80 %G   RBC 4.24  4.04 - 5.48 M/uL   Hemoglobin 12.7  12.2 - 16.2 g/dL   HCT, POC 39.9  37.7 - 47.9 %   MCV 94.2  80 - 97 fL   MCH, POC 30.0  27 - 31.2 pg   MCHC 31.8  31.8 - 35.4 g/dL   RDW, POC 13.2     Platelet Count, POC 407  142 - 424 K/uL   MPV 8.6  0 - 99.8 fL  GLUCOSE, POCT (MANUAL RESULT ENTRY)      Result Value Ref Range   POC Glucose 101 (*) 70 - 99 mg/dl  POCT URINALYSIS DIPSTICK      Result Value Ref Range   Color, UA yellow     Clarity, UA clear     Glucose, UA 100     Bilirubin, UA neg     Ketones, UA 15     Spec Grav, UA <=1.005     Blood, UA neg     pH, UA 6.0     Protein, UA neg     Urobilinogen, UA 0.2     Nitrite, UA neg     Leukocytes, UA Negative    POCT UA - MICROSCOPIC ONLY      Result Value Ref Range   WBC, Ur, HPF, POC 0-1     RBC, urine, microscopic neg     Bacteria, U Microscopic neg     Mucus, UA neg     Epithelial cells, urine per micros 0-1     Crystals, Ur, HPF, POC neg     Casts, Ur, LPF, POC neg     Yeast, UA neg         negative orthostatics. EKG: NSR, no ischemic changes  Assessment & Plan:   Essential hypertension - Plan: POCT CBC, POCT glucose (manual entry), POCT urinalysis dipstick, POCT UA - Microscopic Only, Comprehensive metabolic panel, TSH, Troponin I, EKG 12-Lead - pt very non-compliant w/ amlodipine 2.5 and w/ 3rd visit requesting to change her BP med and will not take BB or losartan due to side effects - only consents to take lisinopril 2.5 - refuses higher dose or different med.  Did have cough prior on lisinopril but pt ok w/ restarting.  Palpitations - Plan: POCT CBC, POCT glucose (manual entry), POCT urinalysis dipstick, POCT UA - Microscopic Only, Comprehensive metabolic panel, TSH, Troponin I, EKG 12-Lead  Chest pain, unspecified - Plan: POCT CBC, POCT  glucose (manual entry), POCT urinalysis dipstick, POCT  UA - Microscopic Only, Comprehensive metabolic panel, TSH, Troponin I, EKG 12-Lead - pt reassured that trop -.  Glucosuria - Plan: POCT glycosylated hemoglobin (Hb A1C), Urine culture - incidental - no diabetes, pt reassured.  Lung nodule, solitary - Plan: Ambulatory referral to Pulmonology - pt has repeatedly refused CT due to her concerns about radiation exp so rec eval by pulm instead.  Meds ordered this encounter  Medications  . lisinopril (PRINIVIL,ZESTRIL) 5 MG tablet    Sig: Take 0.5 tablets (2.5 mg total) by mouth daily.    Dispense:  30 tablet    Refill:  0    Delman Cheadle, MD MPH   Over 40 min spent in face-to-face evaluation of and consultation with patient and coordination of care.

## 2014-02-12 ENCOUNTER — Encounter: Payer: Self-pay | Admitting: Family Medicine

## 2014-02-12 LAB — TSH: TSH: 0.862 u[IU]/mL (ref 0.350–4.500)

## 2014-02-12 LAB — URINE CULTURE
COLONY COUNT: NO GROWTH
Organism ID, Bacteria: NO GROWTH

## 2014-02-19 ENCOUNTER — Ambulatory Visit (INDEPENDENT_AMBULATORY_CARE_PROVIDER_SITE_OTHER)
Admission: RE | Admit: 2014-02-19 | Discharge: 2014-02-19 | Disposition: A | Discharge status: Home / Self Care | Payer: Medicare Other | Source: Ambulatory Visit | Attending: Internal Medicine | Admitting: Internal Medicine

## 2014-02-19 ENCOUNTER — Encounter: Payer: Self-pay | Admitting: Internal Medicine

## 2014-02-19 ENCOUNTER — Ambulatory Visit (INDEPENDENT_AMBULATORY_CARE_PROVIDER_SITE_OTHER): Payer: Medicare Other | Admitting: Internal Medicine

## 2014-02-19 VITALS — BP 106/60 | HR 70 | Ht <= 58 in | Wt 99.0 lb

## 2014-02-19 DIAGNOSIS — R05 Cough: Secondary | ICD-10-CM

## 2014-02-19 DIAGNOSIS — R911 Solitary pulmonary nodule: Secondary | ICD-10-CM

## 2014-02-19 DIAGNOSIS — R058 Other specified cough: Secondary | ICD-10-CM | POA: Insufficient documentation

## 2014-02-19 DIAGNOSIS — I10 Essential (primary) hypertension: Secondary | ICD-10-CM

## 2014-02-19 DIAGNOSIS — R059 Cough, unspecified: Secondary | ICD-10-CM

## 2014-02-19 NOTE — Patient Instructions (Signed)
Please remember to go to the  x-ray department downstairs for your tests - we will call you with the results when they are available.  Your cough from your lisinopril may get worse especially with cold or sinus problem and there are plenty more alternatives available per Dr Marigene Ehlers / Brigitte Pulse

## 2014-02-19 NOTE — Assessment & Plan Note (Signed)
The most common causes of chronic cough in immunocompetent adults include the following: upper airway cough syndrome (UACS), previously referred to as postnasal drip syndrome (PNDS), which is caused by variety of rhinosinus conditions; (2) asthma; (3) GERD; (4) chronic bronchitis from cigarette smoking or other inhaled environmental irritants; (5) nonasthmatic eosinophilic bronchitis; and (6) bronchiectasis.   These conditions, singly or in combination, have accounted for up to 94% of the causes of chronic cough in prospective studies.   Other conditions have constituted no >6% of the causes in prospective studies These have included bronchogenic carcinoma, chronic interstitial pneumonia, sarcoidosis, left ventricular failure, ACEI-induced cough, and aspiration from a condition associated with pharyngeal dysfunction.    Chronic cough is often simultaneously caused by more than one condition. A single cause has been found from 38 to 82% of the time, multiple causes from 18 to 62%. Multiply caused cough has been the result of three diseases up to 42% of the time.       Based on hx and exam, this is most likely:  Classic Upper airway cough syndrome, so named because it's frequently impossible to sort out how much is  CR/sinusitis with freq throat clearing (which can be related to primary GERD)   vs  causing  secondary (" extra esophageal")  GERD from wide swings in gastric pressure that occur with throat clearing, often  promoting self use of mint and menthol lozenges that reduce the lower esophageal sphincter tone and exacerbate the problem further in a cyclical fashion.   These are the same pts (now being labeled as having "irritable larynx syndrome" by some cough centers) who not infrequently have a history of having failed to tolerate ace inhibitors,  dry powder inhalers or biphosphonates or report having atypical reflux symptoms that don't respond to standard doses of PPI , and are easily confused as  having aecopd or asthma flares by even experienced allergists/ pulmonologists.   The first step if cough gets worse is trial off acei / ARB should work fine though see hbp for suitable alternatives If still coughing p 6 weeks then pulmonary re-eval needed

## 2014-02-19 NOTE — Progress Notes (Signed)
Quick Note:  Spoke with pt and notified of results per Dr. Wert. Pt verbalized understanding and denied any questions.  ______ 

## 2014-02-19 NOTE — Assessment & Plan Note (Signed)
ACE inhibitors are problematic in  pts with airway complaints because  even experienced pulmonologists can't always distinguish ace effects from copd/asthma/pnds/ allergies etc.  By themselves they don't actually cause a problem, much like oxygen can't by itself start a fire, but they certainly serve as a powerful catalyst or enhancer for any "fire"  or inflammatory process in the upper airway, be it caused by an ET  tube or more commonly reflux (especially in the obese or pts with known GERD or who are on biphoshonates) or URI's, due to interference with bradykinin clearance.  The effects of acei on bradykinin levels occurs in 100% of pt's on acei (unless they surreptitiously stop the med!) but the classic cough is only reported in 5%.  This leaves 95% of pts on acei's  with a variety of syndromes including no identifiable symptom in most  vs non-specific symptoms that wax and wane depending on what other insult is occuring at the level of the upper airway, like sinus dz/ rhinitis and gerd  If acei effect gets worse, strongly rec trial of arb other than losartan which has reported to cause similar effects on upper airway though not mediated via bradykinin pathway

## 2014-02-19 NOTE — Progress Notes (Signed)
   Subjective:    Patient ID: [name], female    DOB: 1945/11/12  MRN: [id number]  HPI  68 yo [place] woman never smoker ? [employer] exp as child with ACEi related cough and ? RUL nodule first detected on plain cxr 05/31/13 but apparently refused ct chest referred to pulmonary clinc 02/19/2014 by Dr Delman Cheadle  02/19/2014 1st  Pulmonary office visit/ [name] on ACEi   Chief Complaint  Patient presents with  . Advice Only    Referred by Dr. Brigitte Pulse for lung nodule.    cough is variably severe day > night, dry with sensation of pnds, much better off acei. Says can't take any other bp med but acei for various non-specific reasons  No obvious  patterns in day to day or daytime variabilty or assoc chronic cough or cp or chest tightness, subjective wheeze overt sinus or hb symptoms. No unusual exp hx or h/o childhood pna/ asthma or knowledge of premature birth.  Sleeping ok without nocturnal  or early am exacerbation  of respiratory  c/o's or need for noct saba. Also denies any obvious fluctuation of symptoms with weather or environmental changes or other aggravating or alleviating factors except as outlined above   Current Medications, Allergies, Complete Past Medical History, Past Surgical History, Family History, and Social History were reviewed in [employer] record.           Review of Systems  Constitutional: Negative for fever and unexpected weight change.  HENT: Negative for congestion, dental problem, ear pain, nosebleeds, postnasal drip, rhinorrhea, sinus pressure, sneezing, sore throat and trouble swallowing.   Eyes: Negative for redness and itching.  Respiratory: Positive for cough. Negative for chest tightness, shortness of breath and wheezing.   Cardiovascular: Negative for palpitations and leg swelling.  Gastrointestinal: Negative for nausea and vomiting.  Genitourinary: Negative for dysuria.  Musculoskeletal: Negative for joint swelling.    Skin: Negative for rash.  Neurological: Negative for headaches.  Hematological: Does not bruise/bleed easily.  Psychiatric/Behavioral: Negative for dysphoric mood. The patient is not nervous/anxious.        Objective:   Physical Exam  amb [place] female nad but extremely confused with details of care and also very evasive when asked questions related to symptoms  "my blood pressure medicine caused my nodule"  Wt Readings from Last 3 Encounters:  02/19/14 99 lb (44.906 kg)  02/11/14 99 lb 3.2 oz (44.997 kg)  02/02/14 99 lb 3.2 oz (44.997 kg)      HEENT: edentulous/ nl  turbinates, and orophanx. Nl external ear canals without cough reflex   NECK :  without JVD/Nodes/TM/ nl carotid upstrokes bilaterally   LUNGS: no acc muscle use, clear to A and P bilaterally without cough on insp or exp maneuvers   CV:  RRR  no s3 or murmur or increase in P2, no edema   ABD:  soft and nontender with nl excursion in the supine position. No bruits or organomegaly, bowel sounds nl  MS:  warm without deformities, calf tenderness, cyanosis or clubbing  SKIN: warm and dry without lesions    NEURO:  alert, approp, no deficits    CXR  02/19/2014 : Negative for pulmonary nodule. No acute disease      Assessment & Plan:

## 2014-02-19 NOTE — Assessment & Plan Note (Signed)
See CXR 12.19/14 > resolved 02/19/2014 and never smoker so no f/u rec at pulmonary clini

## 2014-04-01 ENCOUNTER — Emergency Department (HOSPITAL_COMMUNITY): Payer: No Typology Code available for payment source

## 2014-04-01 ENCOUNTER — Emergency Department (HOSPITAL_COMMUNITY)
Admission: EM | Admit: 2014-04-01 | Discharge: 2014-04-01 | Disposition: A | Discharge status: Home / Self Care | Payer: No Typology Code available for payment source | Attending: Emergency Medicine | Admitting: Emergency Medicine

## 2014-04-01 ENCOUNTER — Encounter (HOSPITAL_COMMUNITY): Payer: Self-pay | Admitting: Emergency Medicine

## 2014-04-01 DIAGNOSIS — I10 Essential (primary) hypertension: Secondary | ICD-10-CM | POA: Insufficient documentation

## 2014-04-01 DIAGNOSIS — Z79899 Other long term (current) drug therapy: Secondary | ICD-10-CM | POA: Diagnosis not present

## 2014-04-01 DIAGNOSIS — Z8719 Personal history of other diseases of the digestive system: Secondary | ICD-10-CM | POA: Diagnosis not present

## 2014-04-01 DIAGNOSIS — Z8639 Personal history of other endocrine, nutritional and metabolic disease: Secondary | ICD-10-CM | POA: Diagnosis not present

## 2014-04-01 DIAGNOSIS — Z8739 Personal history of other diseases of the musculoskeletal system and connective tissue: Secondary | ICD-10-CM | POA: Diagnosis not present

## 2014-04-01 DIAGNOSIS — Y92410 Unspecified street and highway as the place of occurrence of the external cause: Secondary | ICD-10-CM | POA: Diagnosis not present

## 2014-04-01 DIAGNOSIS — Y9389 Activity, other specified: Secondary | ICD-10-CM | POA: Diagnosis not present

## 2014-04-01 DIAGNOSIS — I252 Old myocardial infarction: Secondary | ICD-10-CM | POA: Diagnosis not present

## 2014-04-01 DIAGNOSIS — S299XXA Unspecified injury of thorax, initial encounter: Secondary | ICD-10-CM | POA: Insufficient documentation

## 2014-04-01 DIAGNOSIS — R079 Chest pain, unspecified: Secondary | ICD-10-CM

## 2014-04-01 DIAGNOSIS — I2510 Atherosclerotic heart disease of native coronary artery without angina pectoris: Secondary | ICD-10-CM | POA: Diagnosis not present

## 2014-04-01 LAB — BASIC METABOLIC PANEL
ANION GAP: 11 (ref 5–15)
BUN: 26 mg/dL — ABNORMAL HIGH (ref 6–23)
CALCIUM: 9 mg/dL (ref 8.4–10.5)
CO2: 25 mEq/L (ref 19–32)
CREATININE: 1.29 mg/dL — AB (ref 0.50–1.10)
Chloride: 102 mEq/L (ref 96–112)
GFR calc non Af Amer: 42 mL/min — ABNORMAL LOW (ref >90)
GFR, EST AFRICAN AMERICAN: 49 mL/min — AB (ref >90)
Glucose, Bld: 102 mg/dL — ABNORMAL HIGH (ref 70–99)
Potassium: 4 mEq/L (ref 3.7–5.3)
SODIUM: 138 meq/L (ref 137–147)

## 2014-04-01 LAB — CBC
HCT: 36.6 % (ref 36.0–46.0)
Hemoglobin: 12.1 g/dL (ref 12.0–15.0)
MCH: 30.3 pg (ref 26.0–34.0)
MCHC: 33.1 g/dL (ref 30.0–36.0)
MCV: 91.7 fL (ref 78.0–100.0)
PLATELETS: 358 10*3/uL (ref 150–400)
RBC: 3.99 MIL/uL (ref 3.87–5.11)
RDW: 12 % (ref 11.5–15.5)
WBC: 5.8 10*3/uL (ref 4.0–10.5)

## 2014-04-01 LAB — I-STAT TROPONIN, ED
Troponin i, poc: 0.02 ng/mL (ref 0.00–0.08)
Troponin i, poc: 0 ng/mL (ref 0.00–0.08)

## 2014-04-01 LAB — URINALYSIS, ROUTINE W REFLEX MICROSCOPIC
BILIRUBIN URINE: NEGATIVE
Glucose, UA: NEGATIVE mg/dL
HGB URINE DIPSTICK: NEGATIVE
Ketones, ur: NEGATIVE mg/dL
Leukocytes, UA: NEGATIVE
NITRITE: NEGATIVE
PH: 6.5 (ref 5.0–8.0)
Protein, ur: NEGATIVE mg/dL
Specific Gravity, Urine: 1.003 — ABNORMAL LOW (ref 1.005–1.030)
Urobilinogen, UA: 0.2 mg/dL (ref 0.0–1.0)

## 2014-04-01 LAB — TROPONIN I: Troponin I: <0.3 ng/mL (ref <0.30)
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
## 2014-04-01 NOTE — Discharge Instructions (Signed)
I am recommending a chest xray to make sure there are no abnormalities.    Chest Pain (Nonspecific) It is often hard to give a specific diagnosis for the cause of chest pain. There is always a chance that your pain could be related to something serious, such as a heart attack or a blood clot in the lungs. You need to follow up with your health care provider for further evaluation. CAUSES   Heartburn.  Pneumonia or bronchitis.  Anxiety or stress.  Inflammation around your heart (pericarditis) or lung (pleuritis or pleurisy).  A blood clot in the lung.  A collapsed lung (pneumothorax). It can develop suddenly on its own (spontaneous pneumothorax) or from trauma to the chest.  Shingles infection (herpes zoster virus). The chest wall is composed of bones, muscles, and cartilage. Any of these can be the source of the pain.  The bones can be bruised by injury.  The muscles or cartilage can be strained by coughing or overwork.  The cartilage can be affected by inflammation and become sore (costochondritis). DIAGNOSIS  Lab tests or other studies may be needed to find the cause of your pain. Your health care provider may have you take a test called an ambulatory electrocardiogram (ECG). An ECG records your heartbeat patterns over a 24-hour period. You may also have other tests, such as:  Transthoracic echocardiogram (TTE). During echocardiography, sound waves are used to evaluate how blood flows through your heart.  Transesophageal echocardiogram (TEE).  Cardiac monitoring. This allows your health care provider to monitor your heart rate and rhythm in real time.  Holter monitor. This is a portable device that records your heartbeat and can help diagnose heart arrhythmias. It allows your health care provider to track your heart activity for several days, if needed.  Stress tests by exercise or by giving medicine that makes the heart beat faster. TREATMENT   Treatment depends on what may  be causing your chest pain. Treatment may include:  Acid blockers for heartburn.  Anti-inflammatory medicine.  Pain medicine for inflammatory conditions.  Antibiotics if an infection is present.  You may be advised to change lifestyle habits. This includes stopping smoking and avoiding alcohol, caffeine, and chocolate.  You may be advised to keep your head raised (elevated) when sleeping. This reduces the chance of acid going backward from your stomach into your esophagus. Most of the time, nonspecific chest pain will improve within 2-3 days with rest and mild pain medicine.  HOME CARE INSTRUCTIONS   If antibiotics were prescribed, take them as directed. Finish them even if you start to feel better.  For the next few days, avoid physical activities that bring on chest pain. Continue physical activities as directed.  Do not use any tobacco products, including cigarettes, chewing tobacco, or electronic cigarettes.  Avoid drinking alcohol.  Only take medicine as directed by your health care provider.  Follow your health care provider's suggestions for further testing if your chest pain does not go away.  Keep any follow-up appointments you made. If you do not go to an appointment, you could develop lasting (chronic) problems with pain. If there is any problem keeping an appointment, call to reschedule. SEEK MEDICAL CARE IF:   Your chest pain does not go away, even after treatment.  You have a rash with blisters on your chest.  You have a fever. SEEK IMMEDIATE MEDICAL CARE IF:   You have increased chest pain or pain that spreads to your arm, neck, jaw, back, or  abdomen.  You have shortness of breath.  You have an increasing cough, or you cough up blood.  You have severe back or abdominal pain.  You feel nauseous or vomit.  You have severe weakness.  You faint.  You have chills. This is an emergency. Do not wait to see if the pain will go away. Get medical help at  once. Call your local emergency services (911 in [place] Do not drive yourself to the hospital. MAKE SURE YOU:   Understand these instructions.  Will watch your condition.  Will get help right away if you are not doing well or get worse. Document Released: 03/09/2005 Document Revised: 06/04/2013 Document Reviewed: 01/03/2008 [facility] Patient Information 2015 [place], [place]. This information is not intended to replace advice given to you by your health care provider. Make sure you discuss any questions you have with your health care provider.

## 2014-04-07 ENCOUNTER — Telehealth: Payer: Self-pay | Admitting: Cardiology

## 2014-04-07 NOTE — Telephone Encounter (Signed)
Pt having problems with BP medications, requesting appt before 04/17/14. I have scheduled pt with PA 04/09/14 1:15 (Dr Aundra Dubin is office afternoon 04/09/14), pt agreed with this plan.

## 2014-04-07 NOTE — Telephone Encounter (Signed)
New message  Pt called states that she would like a sooner appt than 04/17/2014. She would like to discuss her medications. Pt would also like to have an ECHO. Please call back to discuss

## 2014-04-09 ENCOUNTER — Ambulatory Visit (INDEPENDENT_AMBULATORY_CARE_PROVIDER_SITE_OTHER): Payer: Medicare Other | Admitting: Physician Assistant

## 2014-04-09 ENCOUNTER — Telehealth: Payer: Self-pay | Admitting: Physician Assistant

## 2014-04-09 ENCOUNTER — Encounter: Payer: Self-pay | Admitting: Physician Assistant

## 2014-04-09 VITALS — BP 150/70 | HR 86 | Ht <= 58 in | Wt 97.8 lb

## 2014-04-09 DIAGNOSIS — I10 Essential (primary) hypertension: Secondary | ICD-10-CM

## 2014-04-09 DIAGNOSIS — E785 Hyperlipidemia, unspecified: Secondary | ICD-10-CM

## 2014-04-09 DIAGNOSIS — I2510 Atherosclerotic heart disease of native coronary artery without angina pectoris: Secondary | ICD-10-CM

## 2014-04-09 DIAGNOSIS — I739 Peripheral vascular disease, unspecified: Secondary | ICD-10-CM

## 2014-04-09 DIAGNOSIS — Z789 Other specified health status: Secondary | ICD-10-CM

## 2014-04-09 DIAGNOSIS — I493 Ventricular premature depolarization: Secondary | ICD-10-CM

## 2014-04-09 DIAGNOSIS — Z889 Allergy status to unspecified drugs, medicaments and biological substances status: Secondary | ICD-10-CM

## 2014-04-09 DIAGNOSIS — R7989 Other specified abnormal findings of blood chemistry: Secondary | ICD-10-CM

## 2014-04-09 DIAGNOSIS — I779 Disorder of arteries and arterioles, unspecified: Secondary | ICD-10-CM | POA: Insufficient documentation

## 2014-04-09 DIAGNOSIS — I6523 Occlusion and stenosis of bilateral carotid arteries: Secondary | ICD-10-CM

## 2014-04-09 DIAGNOSIS — N289 Disorder of kidney and ureter, unspecified: Secondary | ICD-10-CM

## 2014-04-09 LAB — BASIC METABOLIC PANEL
BUN: 19 mg/dL (ref 6–23)
CHLORIDE: 104 meq/L (ref 96–112)
CO2: 26 meq/L (ref 19–32)
Calcium: 9 mg/dL (ref 8.4–10.5)
Creatinine, Ser: 1.5 mg/dL — ABNORMAL HIGH (ref 0.4–1.2)
GFR: 38.29 mL/min — ABNORMAL LOW (ref >60.00)
Glucose, Bld: 140 mg/dL — ABNORMAL HIGH (ref 70–99)
Potassium: 4 mEq/L (ref 3.5–5.1)
Sodium: 138 mEq/L (ref 135–145)

## 2014-04-09 NOTE — Patient Instructions (Signed)
Your physician recommends that you continue on your current medications as directed. Please refer to the Current Medication list given to you today.   Your physician recommends that you return for lab work in [place]

## 2014-04-09 NOTE — Telephone Encounter (Signed)
°  Patient was seen today and wants to know what the results of her EKG. She said it was never discussed in her appointment. Please call and advise.

## 2014-04-09 NOTE — Telephone Encounter (Signed)
Message copied by Brett Canales on Wed Apr 09, 2014  4:59 PM ------      Message from: Charlie Pitter      Created: Wed Apr 09, 2014  4:19 PM       Please call the patient. Creatinine has risen to 1.5. I discussed this with Dr. Aundra Dubin who recommends to continue lisinopril and increase fluid intake.      She will need a repeat BMET when she returns for her follow-up in 1 week. If creatinine remains up, we will need to consider another BP agent although we are limited due to her medication intolerances.      Glucose is also somewhat elevated at 140 and she should f/u PCP for monitoring of her blood sugars.      I also saw where she called in to ask about the results of her EKG - please let her know this showed normal sinus rhythm, normal intervals, nonspecific T wave changes, and was not significantly changed from prior tracings. I apologize for not sharing this interpretation with her.      Dayna Dunn PA-C       ------
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
## 2014-04-15 ENCOUNTER — Other Ambulatory Visit: Payer: Self-pay | Admitting: Family Medicine

## 2014-04-15 NOTE — Progress Notes (Signed)
Subjective:    Patient ID: [name], female    DOB: 1945-08-24, 68 y.o.   MRN: [id number]  08/09/2013  Hypertension   HPI This 68 y.o. female presents for three week follow-up of HTN. Evaluated by Dr. Carlota Raspberry three weeks ago; started on Losartan 50mg  daily.  Does not feel that Losartan is working well. Wants to try a different medication. Has been intolerant to "all B blockers" in the past; previous use of Lisinopril without effectiveness. Cardiology did not recommend Calcium Channel blockers.  Blood pressures at home are 130s/80s but patient does not feel that medication is working well. Denies chest pain, palpitations, SOB, leg swelling. Denies HA, dizziness, focal weakness, paresthesias.     Review of Systems  Constitutional: Negative for fever, chills, diaphoresis and fatigue.  Eyes: Negative for visual disturbance.  Respiratory: Negative for cough and shortness of breath.   Cardiovascular: Negative for chest pain, palpitations and leg swelling.  Gastrointestinal: Negative for nausea, vomiting, abdominal pain, diarrhea and constipation.  Endocrine: Negative for cold intolerance, heat intolerance, polydipsia, polyphagia and polyuria.  Neurological: Negative for dizziness, tremors, seizures, syncope, facial asymmetry, speech difficulty, weakness, light-headedness, numbness and headaches.    Past Medical History  Diagnosis Date  . GERD (gastroesophageal reflux disease)   . Hiatal hernia   . Arthritis   . HTN (hypertension)   . HLD (hyperlipidemia)   . CAD (coronary artery disease)     a. s/p NSTEMI 10/12: LHC 04/02/11: mLAD 95%, EF 60%; PCI: Promus DES to mLAD. b. LHC 04/29/11:  LAD stent patent, proximal RCA 25%, EF 65%   . NSTEMI (non-ST elevated myocardial infarction) 2012  . Multinodular thyroid   . Lung nodule     a. Pt initially refused further workup. b. Per pulm note, resolved 02/19/14 and never smoker so no further f/u needed.  [name] History of noncompliance with  medical treatment   . HBV (hepatitis B virus) infection   . Carotid stenosis     a. 40-59% bilateral ICA stenosis, repeat 1 year.  [name] PVC's (premature ventricular contractions)    Past Surgical History  Procedure Laterality Date  . Cesarean section    . Tonsillectomy    . Hemorroidectomy      1999  . Coronary stent placement  2012   Allergies  Allergen Reactions  . Atorvastatin Other (See Comments)    Muscle pain  . Crestor [Rosuvastatin] Other (See Comments)    Muscle pain  . Ibuprofen     REACTION: rapid heart rate  . Pravastatin Sodium Other (See Comments)    Muscle cramps/ confusion   Current Outpatient Prescriptions  Medication Sig Dispense Refill  . lisinopril (PRINIVIL,ZESTRIL) 2.5 MG tablet Take 2.5 mg by mouth daily.    [name] LORazepam (ATIVAN) 0.5 MG tablet Take 0.5 mg by mouth daily.    . nitroGLYCERIN (NITROSTAT) 0.4 MG SL tablet Place 0.4 mg under the tongue every 5 (five) minutes as needed for chest pain.     No current facility-administered medications for this visit.       Objective:    BP 130/70 mmHg  Pulse 91  Temp(Src) 97.9 F (36.6 C) (Oral)  Resp 16  Ht 4\' 8"  (1.422 m)  Wt 96 lb (43.545 kg)  BMI 21.53 kg/m2  SpO2 99% Physical Exam  Constitutional: She is oriented to person, place, and time. She appears well-developed and well-nourished. No distress.  HENT:  Head: Normocephalic and atraumatic.  Right Ear: External ear normal.  Left Ear:  External ear normal.  Nose: Nose normal.  Mouth/Throat: Oropharynx is clear and moist.  Eyes: Conjunctivae and EOM are normal. Pupils are equal, round, and reactive to light.  Neck: Normal range of motion. Neck supple. Carotid bruit is not present. No thyromegaly present.  Cardiovascular: Normal rate, regular rhythm, normal heart sounds and intact distal pulses.  Exam reveals no gallop and no friction rub.   No murmur heard. Pulmonary/Chest: Effort normal and breath sounds normal. She has no wheezes. She has no  rales.  Abdominal: Soft. Bowel sounds are normal. She exhibits no distension and no mass. There is no tenderness. There is no rebound and no guarding.  Lymphadenopathy:    She has no cervical adenopathy.  Neurological: She is alert and oriented to person, place, and time. No cranial nerve deficit.  Skin: Skin is warm and dry. No rash noted. She is not diaphoretic. No erythema. No pallor.  Psychiatric: She has a normal mood and affect. Her behavior is normal.   Results for orders placed or performed in visit on 08/09/13  Comprehensive metabolic panel  Result Value Ref Range   Sodium 141 135 - 145 mEq/L   Potassium 4.2 3.5 - 5.3 mEq/L   Chloride 104 96 - 112 mEq/L   CO2 26 19 - 32 mEq/L   Glucose, Bld 117 (H) 70 - 99 mg/dL   BUN 19 6 - 23 mg/dL   Creat 0.85 0.50 - 1.10 mg/dL   Total Bilirubin 0.3 0.2 - 1.2 mg/dL   Alkaline Phosphatase 58 39 - 117 U/L   AST 19 0 - 37 U/L   ALT 17 0 - 35 U/L   Total Protein 7.6 6.0 - 8.3 g/dL   Albumin 4.6 3.5 - 5.2 g/dL   Calcium 9.3 8.4 - 10.5 mg/dL       Assessment & Plan:   1. Essential hypertension, benign     1. HTN: moderately controlled; switch Losartan 50mg  to Losartan-HCTZ 50-12.5mg  daily.  Obtain labs. RTC one month for reevaluation on new medication.   Meds ordered this encounter  Medications  . DISCONTD: losartan-hydrochlorothiazide (HYZAAR) 50-12.5 MG per tablet    Sig: Take 1 tablet by mouth daily.    Dispense:  30 tablet    Refill:  5    Return in about 1 month (around 09/06/2013) for recheck blood pressure.    Reginia Forts, M.D.  [facility] [address] [place], [place]  [address] [phone] phone [phone] fax

## 2014-04-17 ENCOUNTER — Ambulatory Visit (INDEPENDENT_AMBULATORY_CARE_PROVIDER_SITE_OTHER): Payer: Medicare Other | Admitting: Cardiology

## 2014-04-17 ENCOUNTER — Other Ambulatory Visit (INDEPENDENT_AMBULATORY_CARE_PROVIDER_SITE_OTHER): Payer: Medicare Other

## 2014-04-17 ENCOUNTER — Encounter: Payer: Self-pay | Admitting: Cardiology

## 2014-04-17 VITALS — BP 160/74 | HR 98 | Ht <= 58 in | Wt 97.0 lb

## 2014-04-17 DIAGNOSIS — R7989 Other specified abnormal findings of blood chemistry: Secondary | ICD-10-CM

## 2014-04-17 DIAGNOSIS — I2510 Atherosclerotic heart disease of native coronary artery without angina pectoris: Secondary | ICD-10-CM

## 2014-04-17 DIAGNOSIS — I10 Essential (primary) hypertension: Secondary | ICD-10-CM

## 2014-04-17 DIAGNOSIS — I6529 Occlusion and stenosis of unspecified carotid artery: Secondary | ICD-10-CM

## 2014-04-17 DIAGNOSIS — I493 Ventricular premature depolarization: Secondary | ICD-10-CM

## 2014-04-17 DIAGNOSIS — R748 Abnormal levels of other serum enzymes: Secondary | ICD-10-CM

## 2014-04-17 LAB — BASIC METABOLIC PANEL
BUN: 17 mg/dL (ref 6–23)
CO2: 26 meq/L (ref 19–32)
Calcium: 8.7 mg/dL (ref 8.4–10.5)
Chloride: 102 mEq/L (ref 96–112)
Creatinine, Ser: 1.1 mg/dL (ref 0.4–1.2)
GFR: 54.97 mL/min — ABNORMAL LOW (ref >60.00)
GLUCOSE: 108 mg/dL — AB (ref 70–99)
Potassium: 3.8 mEq/L (ref 3.5–5.1)
SODIUM: 139 meq/L (ref 135–145)

## 2014-04-17 MED ORDER — ASPIRIN EC 81 MG PO TBEC: 81.0000 mg | DELAYED_RELEASE_TABLET | ORAL | Status: DC

## 2014-04-17 NOTE — Patient Instructions (Signed)
Your physician has recommended you make the following change in your medication:  1.) take aspirin 81 mg every other day  Your physician recommends that you return for lab work in: today Artist)  Your physician wants you to follow-up in: 6 months with a physician extender (PA or Nurse Practitioner)  You will receive a reminder letter in the mail two months in advance. If you don't receive a letter, please call our office to schedule the follow-up appointment.  Your physician has requested that you have a renal artery duplex. During this test, an ultrasound is used to evaluate blood flow to the kidneys. Allow one hour for this exam. Do not eat after midnight the day before and avoid carbonated beverages. Take your medications as you usually do.

## 2014-04-18 NOTE — Progress Notes (Signed)
Patient ID: [name], female   DOB: 1945/07/10, 68 y.o.   MRN: [id number] PCP: Dr. Keturah Barre. Hopper  [name] is a 68 y.o. female who presents for follow up.  She was admitted to [facility] 03/2011 with a NSTEMI. LHC demonstrated single vessel CAD with 95% mLAD and this was treated with a Promus DES. EF was preserved. She was reluctant to start most medications. She refused beta blocker due to h/o side effects in the past. She was seen in followup in 11/12.  She continued to note chest pain.  She also noted palpitations.  She complained of significant fatigue and was actually riding a scooter around in a grocery store.  We set her up for a relook heart catheterization in 11/12: LAD stent patent, proximal RCA 25%, EF 65%.  She has not been able to tolerate statins.  She quit taking losartan because she "built up tolerance to it."   She has had symptomatic palpitations.  She has had trouble taking beta blockers.  She did not tolerate diltiazem. Holter in 1/15 showed rare PACs/PVCs.  She has not had much in the way of palpitations recently.  Occasional "dizziness," no syncope.  She has only very rare, fleeting atypical chest pain.  No exertional dyspnea.  She stopped aspirin due to nosebleeds and insomnia.  She does not want any cholesterol medications.  Her BP has been running high recently, and she was given a low dose of lisinopril by the PA at recent appointment.  She takes this "occasionally" but says it makes her head feel like it is "crawling."    Labs (11/12): Potassium 4.2, creatinine 0.2, TC 112, TG 112, HDL 49, LDL 41. Labs (2/13): K 4.1, creatinine 0.73, LDL 84, HDL 59, HBsAB negative, HBsAg positive, HCV negative, AST 43, ALT 67 Labs (4/13): AST 50, ALT 91 Labs (12/14): TSH normal, K 3.6, creatinine 0.7, LDL 101, HDL 52, HCT 35.4 Labs (11/15): K 4, creatinine 1.5  PMH: 1. GERD with hiatal hernia 2. HBV 3. HTN 4. Hyperlipidemia 5. CAD: NSTEMI 10/12 with 95% mLAD stenosis tx with  Promus DES.  EF 60% on LV-gram.  Relook cath in 11/12 with patent LAD stent, EF 65%.   6. Medical noncompliance.  7. Carotid stenosis: carotid dopplers (4/13) 76-73% RICA, 41-93% LICA. Carotid dopplers (9/15) with 40-59% bilateral ICA stenosis.   8. Thyroid nodule.  9. PVCs/PACs: Holter (1/15) with rare PACs/PVCs  Current Outpatient Prescriptions  Medication Sig Dispense Refill  . lisinopril (PRINIVIL,ZESTRIL) 2.5 MG tablet Take 2.5 mg by mouth daily.    [name] LORazepam (ATIVAN) 0.5 MG tablet Take 0.5 mg by mouth daily.    . nitroGLYCERIN (NITROSTAT) 0.4 MG SL tablet Place 0.4 mg under the tongue every 5 (five) minutes as needed for chest pain.    [name] aspirin EC 81 MG tablet Take 1 tablet (81 mg total) by mouth every other day. 90 tablet 3   No current facility-administered medications for this visit.    Allergies: Allergies  Allergen Reactions  . Atorvastatin Other (See Comments)    Muscle pain  . Crestor [Rosuvastatin] Other (See Comments)    Muscle pain  . Ibuprofen     REACTION: rapid heart rate  . Pravastatin Sodium Other (See Comments)    Muscle cramps/ confusion    History  Substance Use Topics  . Smoking status: Never Smoker   . Smokeless tobacco: Never Used  . Alcohol Use: No   Originally from [place].   FH: No premature  CAD  ROS:  Please see the history of present illness.   All other systems reviewed and negative.   PHYSICAL EXAM: VS:  BP 160/74 mmHg  Pulse 98  Ht 4\' 8"  (1.422 m)  Wt 97 lb (43.999 kg)  BMI 21.76 kg/m2  SpO2 98% Well nourished, well developed, in no acute distress HEENT: normal Neck: no JVD, nodule in thyroid area.  Cardiac:  normal S1, S2; RRR; 1/6 SEM.  Bilateral carotid bruits.  Lungs:  clear to auscultation bilaterally, no wheezing, rhonchi or rales Abd: soft, nontender, no hepatomegaly Ext: no edema; Right femoral arteriotomy site without hematoma or bruit Skin: warm and dry  Assessment/Plan: 1. CAD: Stable with no ischemic  symptoms.  She has refused or not tolerated statins, beta blockers, and ACEI/ARBs.  She will not take aspirin because she says that it causes insomnia; I asked her to try to take it every other day.  2. Hyperlipidemia: Her LDL has not been bad considering she is not taking a statin.  She does not want to try another statin or Zetia.  3. Palpitations: PACs and PVCs on holter.  Quiescent recently.  4. Carotid stenosis: Mild to moderate bilateral stenosis.  Will not take ASA or statin. 5. HTN: BP has been running high.  She is intolerant to most BP meds.  She is taking lisinopril 2.5 mg daily but does not like it.  She is taking it every few days.  Unfortunately, creatinine is up to 1.5 and would limit titration even if she were willing to increase the dose. Will get renal artery dopplers given relatively sudden rise in BP.  6. Patient's care is significantly inhibited by a large number of medication intolerances.   Loralie Champagne 04/18/2014

## 2014-04-23 ENCOUNTER — Ambulatory Visit (HOSPITAL_COMMUNITY): Payer: Medicare Other | Attending: Cardiology | Admitting: *Deleted

## 2014-04-23 DIAGNOSIS — E785 Hyperlipidemia, unspecified: Secondary | ICD-10-CM | POA: Insufficient documentation

## 2014-04-23 DIAGNOSIS — I10 Essential (primary) hypertension: Secondary | ICD-10-CM | POA: Diagnosis present

## 2014-04-23 DIAGNOSIS — I2510 Atherosclerotic heart disease of native coronary artery without angina pectoris: Secondary | ICD-10-CM | POA: Insufficient documentation

## 2014-04-23 NOTE — Progress Notes (Signed)
Renal Artery Duplex Complete

## 2014-04-28 ENCOUNTER — Telehealth: Payer: Self-pay | Admitting: Cardiology

## 2014-04-28 NOTE — Telephone Encounter (Signed)
Patient informed. 

## 2014-04-28 NOTE — Telephone Encounter (Signed)
New  Message:  Pt is calling to get results from test that was done on 11/11. Please call\  Thanks

## 2014-04-28 NOTE — Progress Notes (Signed)
Left message at home for patient to call back.

## 2014-05-22 ENCOUNTER — Telehealth: Payer: Self-pay

## 2014-05-22 ENCOUNTER — Ambulatory Visit (INDEPENDENT_AMBULATORY_CARE_PROVIDER_SITE_OTHER): Payer: Medicare Other | Admitting: Emergency Medicine

## 2014-05-22 VITALS — BP 132/54 | HR 80 | Temp 98.0°F | Resp 16 | Ht <= 58 in | Wt 97.8 lb

## 2014-05-22 DIAGNOSIS — I10 Essential (primary) hypertension: Secondary | ICD-10-CM

## 2014-05-22 DIAGNOSIS — I2510 Atherosclerotic heart disease of native coronary artery without angina pectoris: Secondary | ICD-10-CM

## 2014-05-22 MED ORDER — LISINOPRIL 2.5 MG PO TABS: 2.5000 mg | ORAL_TABLET | Freq: Every day | ORAL | Status: DC

## 2014-05-22 NOTE — Progress Notes (Signed)
[facility] [address], [place] [address] [phone]  Date:  05/22/2014   Name:  [name]   DOB:  1946/04/02   MRN:  [id number]  PCP:  Ruben Reason, MD    Chief Complaint: Medication Refill and Hypertension   History of Present Illness:  [name] is a 68 y.o. very pleasant female patient who presents with the following:  Patient has a history of hypertension.  She is compliant with her meds.  Is free of side effect of medication. No chest pain or shortness o fbreath No edema No nausea or vomiting.  No lightheadedness Denies other complaint or health concern today.    Patient Active Problem List   Diagnosis Date Noted  . PVC's (premature ventricular contractions)   . Carotid stenosis   . Upper airway cough syndrome 02/19/2014  . Lung nodule, solitary 06/01/2013  . Chest pain 05/31/2013  . Carotid bruit 09/11/2011  . Elevated LFTs 09/11/2011  . Palpitations 04/26/2011  . CAD (coronary artery disease)   . Chest pain 09/15/2010  . Hyperlipidemia 09/15/2010  . Hypertension 09/15/2010    Past Medical History  Diagnosis Date  . GERD (gastroesophageal reflux disease)   . Hiatal hernia   . Arthritis   . HTN (hypertension)   . HLD (hyperlipidemia)   . CAD (coronary artery disease)     a. s/p NSTEMI 10/12: LHC 04/02/11: mLAD 95%, EF 60%; PCI: Promus DES to mLAD. b. LHC 04/29/11:  LAD stent patent, proximal RCA 25%, EF 65%   . NSTEMI (non-ST elevated myocardial infarction) 2012  . Multinodular thyroid   . Lung nodule     a. Pt initially refused further workup. b. Per pulm note, resolved 02/19/14 and never smoker so no further f/u needed.  [name] History of noncompliance with medical treatment   . HBV (hepatitis B virus) infection   . Carotid stenosis     a. 40-59% bilateral ICA stenosis, repeat 1 year.  [name] PVC's (premature ventricular contractions)     Past Surgical History  Procedure Laterality Date  . Cesarean section     . Tonsillectomy    . Hemorroidectomy      1999  . Coronary stent placement  2012    History  Substance Use Topics  . Smoking status: Never Smoker   . Smokeless tobacco: Never Used  . Alcohol Use: No    Family History  Problem Relation Age of Onset  . Prostate cancer Father   . Cancer Father 3    bladder cancer  . Alcohol abuse Father   . Diabetes Sister   . Diabetes Sister     Allergies  Allergen Reactions  . Atorvastatin Other (See Comments)    Muscle pain  . Crestor [Rosuvastatin] Other (See Comments)    Muscle pain  . Ibuprofen     REACTION: rapid heart rate  . Pravastatin Sodium Other (See Comments)    Muscle cramps/ confusion    Medication list has been reviewed and updated.  Current Outpatient Prescriptions on File Prior to Visit  Medication Sig Dispense Refill  . aspirin EC 81 MG tablet Take 1 tablet (81 mg total) by mouth every other day. 90 tablet 3  . lisinopril (PRINIVIL,ZESTRIL) 2.5 MG tablet Take 2.5 mg by mouth daily.    . nitroGLYCERIN (NITROSTAT) 0.4 MG SL tablet Place 0.4 mg under the tongue every 5 (five) minutes as needed for chest pain.     No current facility-administered medications on  file prior to visit.    Review of Systems:  As per HPI, otherwise negative.    Physical Examination: Filed Vitals:   05/22/14 0919  BP: 132/54  Pulse: 80  Temp: 98 F (36.7 C)  Resp: 16   Filed Vitals:   05/22/14 0919  Height: 4\' 8"  (1.422 m)  Weight: 97 lb 12.8 oz (44.362 kg)   Body mass index is 21.94 kg/(m^2). Ideal Body Weight: Weight in (lb) to have BMI = 25: 111.3  GEN: WDWN, NAD, Non-toxic, A & O x 3 HEENT: Atraumatic, Normocephalic. Neck supple. No masses, No LAD. Ears and Nose: No external deformity. CV: RRR, No M/G/R. No JVD. No thrill. No extra heart sounds. PULM: CTA B, no wheezes, crackles, rhonchi. No retractions. No resp. distress. No accessory muscle use. ABD: S, NT, ND, +BS. No rebound. No HSM. EXTR: No c/c/e NEURO  Normal gait.  PSYCH: Normally interactive. Conversant. Not depressed or anxious appearing.  Calm demeanor.    Assessment and Plan: Hypertension CAD Reviewed labs  Signed,  Ellison Carwin, MD

## 2014-05-22 NOTE — Patient Instructions (Signed)

## 2014-05-22 NOTE — Telephone Encounter (Signed)
Called to remind patient to get her flu shot but her husband stated she was here being seen at the urgent care building.

## 2014-05-24 ENCOUNTER — Other Ambulatory Visit: Payer: Self-pay | Admitting: Family Medicine

## 2014-06-23 ENCOUNTER — Other Ambulatory Visit: Payer: Self-pay

## 2014-06-23 MED ORDER — LISINOPRIL 2.5 MG PO TABS: 2.5000 mg | ORAL_TABLET | Freq: Every day | ORAL | Status: DC

## 2014-07-18 ENCOUNTER — Ambulatory Visit (INDEPENDENT_AMBULATORY_CARE_PROVIDER_SITE_OTHER): Payer: Medicare Other | Admitting: Emergency Medicine

## 2014-07-18 VITALS — BP 136/56 | HR 97 | Temp 98.3°F | Resp 16

## 2014-07-18 DIAGNOSIS — I10 Essential (primary) hypertension: Secondary | ICD-10-CM

## 2014-07-18 LAB — COMPLETE METABOLIC PANEL WITH GFR
ALT: 18 U/L (ref 0–35)
AST: 21 U/L (ref 0–37)
Albumin: 4.1 g/dL (ref 3.5–5.2)
Alkaline Phosphatase: 54 U/L (ref 39–117)
BUN: 19 mg/dL (ref 6–23)
CO2: 27 mEq/L (ref 19–32)
Calcium: 9.2 mg/dL (ref 8.4–10.5)
Chloride: 104 mEq/L (ref 96–112)
Creat: 0.89 mg/dL (ref 0.50–1.10)
GFR, EST NON AFRICAN AMERICAN: 67 mL/min
GFR, Est African American: 78 mL/min
Glucose, Bld: 130 mg/dL — ABNORMAL HIGH (ref 70–99)
Potassium: 4.2 mEq/L (ref 3.5–5.3)
SODIUM: 138 meq/L (ref 135–145)
Total Bilirubin: 0.2 mg/dL (ref 0.2–1.2)
Total Protein: 7 g/dL (ref 6.0–8.3)

## 2014-07-18 LAB — POCT CBC
Granulocyte percent: 68.7 %G (ref 37–80)
HEMATOCRIT: 40.8 % (ref 37.7–47.9)
HEMOGLOBIN: 13.5 g/dL (ref 12.2–16.2)
Lymph, poc: 2.1 (ref 0.6–3.4)
MCH, POC: 30.4 pg (ref 27–31.2)
MCHC: 33.1 g/dL (ref 31.8–35.4)
MCV: 92 fL (ref 80–97)
MID (cbc): 0.4 (ref 0–0.9)
MPV: 7.4 fL (ref 0–99.8)
POC GRANULOCYTE: 5.5 (ref 2–6.9)
POC LYMPH PERCENT: 25.9 %L (ref 10–50)
POC MID %: 5.4 % (ref 0–12)
Platelet Count, POC: 470 10*3/uL — AB (ref 142–424)
RBC: 4.43 M/uL (ref 4.04–5.48)
RDW, POC: 12.7 %
WBC: 8 10*3/uL (ref 4.6–10.2)

## 2014-07-18 MED ORDER — LOSARTAN POTASSIUM 50 MG PO TABS: ORAL_TABLET | ORAL | Status: DC

## 2014-07-18 NOTE — Progress Notes (Addendum)
   Subjective:   This chart was scribed for [name] Russian, MD, by Delphia Grates, ED Scribe. This patient was seen in room Room/bed info 1 and the patient's care was started at 4:03 PM.   Patient ID: [name], female    DOB: 09/24/1945, 69 y.o.   MRN: [id number]  HPI  HPI Comments: [name] is a 69 y.o. female, with history of MI (October 2012), HTN, CAD, HLD, and chest pain, who presents to the [facility] complaining of side effects of Lisinopril. Patient states she was prescribed Lisinopril 2.5mg  by Dr. Brigitte Pulse, 2 months ago, and has been experiencing palpitations, HA, cough, and dizziness since. Patient was seen her by her cardiologist 3 months ago and denies any complications. Patient is requesting to resume Losartan, instead. She denies fever, chills, or chest pain.  Repeat BP 130/63   Review of Systems  Constitutional: Negative for fever and chills.  Respiratory: Positive for cough.   Cardiovascular: Positive for palpitations. Negative for chest pain.  Neurological: Positive for dizziness and headaches.       Objective:   Physical Exam  CONSTITUTIONAL: Well developed/well nourished HEAD: Normocephalic/atraumatic EYES: EOMI/PERRL ENMT: Mucous membranes moist NECK: supple no meningeal signs SPINE/BACK:entire spine nontender CV: S1/S2 noted, no murmurs/rubs/gallops noted. Repeat BP 130/63 LUNGS: Lungs are clear to auscultation bilaterally, no apparent distress ABDOMEN: soft, nontender, no rebound or guarding, bowel sounds noted throughout abdomen GU:no cva tenderness NEURO: Pt is awake/alert/appropriate, moves all extremitiesx4.  No facial droop.   EXTREMITIES: pulses normal/equal, full ROM SKIN: warm, color normal PSYCH: no abnormalities of mood noted, alert and oriented to situation. Mild anxiety.  Results for orders placed or performed in visit on 07/18/14  POCT CBC  Result Value Ref Range   WBC 8.0 4.6 - 10.2 K/uL   Lymph,  poc 2.1 0.6 - 3.4   POC LYMPH PERCENT 25.9 10 - 50 %L   MID (cbc) 0.4 0 - 0.9   POC MID % 5.4 0 - 12 %M   POC Granulocyte 5.5 2 - 6.9   Granulocyte percent 68.7 37 - 80 %G   RBC 4.43 4.04 - 5.48 M/uL   Hemoglobin 13.5 12.2 - 16.2 g/dL   HCT, POC 40.8 37.7 - 47.9 %   MCV 92.0 80 - 97 fL   MCH, POC 30.4 27 - 31.2 pg   MCHC 33.1 31.8 - 35.4 g/dL   RDW, POC 12.7 %   Platelet Count, POC 470.0 (A) 142 - 424 K/uL   MPV 7.4 0 - 99.8 fL       Assessment & Plan:   Patient wants to come off of her lisinopril. Dr. Melvyn Novas had recommended she try different ARB than losartan but patient is convinced that losartan will be the only drug she can take. Have started her on losartan 50 mg to take one half tablet a day which is 25 mg hopefully she will be able to tolerate this. The lisinopril will be stopped.I personally performed the services described in this documentation, which was scribed in my presence. The recorded information has been reviewed and is accurate.

## 2014-07-18 NOTE — Patient Instructions (Signed)
Please stop the lisinopril. Start the losartan 50 mg one half tablet a day

## 2014-07-24 ENCOUNTER — Telehealth: Payer: Self-pay | Admitting: Cardiology

## 2014-07-24 ENCOUNTER — Ambulatory Visit (INDEPENDENT_AMBULATORY_CARE_PROVIDER_SITE_OTHER): Payer: Medicare Other | Admitting: Physician Assistant

## 2014-07-24 ENCOUNTER — Encounter: Payer: Self-pay | Admitting: Physician Assistant

## 2014-07-24 VITALS — BP 154/54 | HR 76 | Ht <= 58 in | Wt 97.0 lb

## 2014-07-24 DIAGNOSIS — M546 Pain in thoracic spine: Secondary | ICD-10-CM | POA: Diagnosis not present

## 2014-07-24 DIAGNOSIS — I2510 Atherosclerotic heart disease of native coronary artery without angina pectoris: Secondary | ICD-10-CM | POA: Diagnosis not present

## 2014-07-24 DIAGNOSIS — I10 Essential (primary) hypertension: Secondary | ICD-10-CM | POA: Diagnosis not present

## 2014-07-24 DIAGNOSIS — I6523 Occlusion and stenosis of bilateral carotid arteries: Secondary | ICD-10-CM | POA: Diagnosis not present

## 2014-07-24 DIAGNOSIS — E785 Hyperlipidemia, unspecified: Secondary | ICD-10-CM | POA: Diagnosis not present

## 2014-07-24 LAB — CBC WITH DIFFERENTIAL/PLATELET
BASOS PCT: 0.6 % (ref 0.0–3.0)
Basophils Absolute: 0 10*3/uL (ref 0.0–0.1)
EOS ABS: 0 10*3/uL (ref 0.0–0.7)
Eosinophils Relative: 0.3 % (ref 0.0–5.0)
HCT: 39.6 % (ref 36.0–46.0)
HEMOGLOBIN: 13.6 g/dL (ref 12.0–15.0)
LYMPHS PCT: 17.8 % (ref 12.0–46.0)
Lymphs Abs: 1.4 10*3/uL (ref 0.7–4.0)
MCHC: 34.3 g/dL (ref 30.0–36.0)
MCV: 89.3 fl (ref 78.0–100.0)
MONOS PCT: 5.4 % (ref 3.0–12.0)
Monocytes Absolute: 0.4 10*3/uL (ref 0.1–1.0)
Neutro Abs: 5.9 10*3/uL (ref 1.4–7.7)
Neutrophils Relative %: 75.9 % (ref 43.0–77.0)
Platelets: 438 10*3/uL — ABNORMAL HIGH (ref 150.0–400.0)
RBC: 4.44 Mil/uL (ref 3.87–5.11)
RDW: 12.4 % (ref 11.5–15.5)
WBC: 7.7 10*3/uL (ref 4.0–10.5)

## 2014-07-24 LAB — BASIC METABOLIC PANEL
BUN: 18 mg/dL (ref 6–23)
CHLORIDE: 104 meq/L (ref 96–112)
CO2: 30 mEq/L (ref 19–32)
Calcium: 9.3 mg/dL (ref 8.4–10.5)
Creatinine, Ser: 0.76 mg/dL (ref 0.40–1.20)
GFR: 80.63 mL/min (ref >60.00)
Glucose, Bld: 83 mg/dL (ref 70–99)
POTASSIUM: 3.8 meq/L (ref 3.5–5.1)
Sodium: 140 mEq/L (ref 135–145)

## 2014-07-24 LAB — TROPONIN I

## 2014-07-24 LAB — D-DIMER, QUANTITATIVE (NOT AT ARMC)

## 2014-07-24 NOTE — Telephone Encounter (Signed)
New Message  Pt wanted to have lab work done today. Pt is aware that an order must be placed first. She requested to speak w/ Rn about lab work. Please call back and discuss.

## 2014-07-24 NOTE — Progress Notes (Signed)
Cardiology Office Note   Date:  07/24/2014   ID:  [name], [name] Feb 07, 1946, MRN [id number]  PCP:  [name] Reason, MD  Cardiologist:  Dr. Loralie [name]     Chief Complaint  Patient presents with  . Back Pain    L scapular since 1:00 am  . Coronary Artery Disease     History of Present Illness: [name] is a 69 y.o. female with a hx of CAD s/p NSTEMI 04/2011 tx with DES to mid LAD, carotid stenosis, HTN, HL, GERD, PAC/PVCs.  At the time of her MI, she was reluctant to start most medications. She has refused multiple medications in the past due to SEs/intolerances (beta blockers, statins, ASA, Diltiazem, Losartan).  Relook heart catheterization in 11/12 done for continued chest pain and fatigue: LAD stent patent, proximal RCA 25%, EF 65%.   Last seen by Dr. Loralie [name] 04/2014.  She saw Dr. Everlene [name] last week and Lisinopril was DC'd.  Losartan was added.    She called in today with back pain and was added on for evaluation.  She tells me that her back pain awoke her at ~ 1:00am.  It is a heaviness at her L scapula.  She feels it radiate to her R scapula.  She denies chest pain. But, she tells me that she has indigestion today.  However, this is not unusual for her.  She has not taken any antacids.  She exercises every day (does 150 steps in place). She did her exercise this morning without causing her pain to increase.  She denies radiation to her jaws, arms, associated nausea or diaphoresis.  She denies orthopnea, PND, edema, syncope.  Her symptoms are not like her MI.  She has not started her Losartan yet for reasons unclear to me.     Studies/Reports Reviewed Today:  Cardiac Cath 04/2011 LAD: Stent in the proximal vessel is widely patent.  RCA: prox 20%  EF 65%   Carotid US 01/2014 Bilateral ICA 40-59%  Normal subclavian arteries, bilaterally. FU 1 year  Renal Artery Duplex 11/15 Normal caliber abdominal aorta. Normal and symmetrical kidney size. Normal  renal arteries, bilaterally. The IVC and renal veins are patent.   Past Medical History  Diagnosis Date  . GERD (gastroesophageal reflux disease)   . Hiatal hernia   . Arthritis   . HTN (hypertension)   . HLD (hyperlipidemia)   . CAD (coronary artery disease)     a. s/p NSTEMI 10/12: LHC 04/02/11: mLAD 95%, EF 60%; PCI: Promus DES to mLAD. b. LHC 04/29/11:  LAD stent patent, proximal RCA 25%, EF 65%   . NSTEMI (non-ST elevated myocardial infarction) 2012  . Multinodular thyroid   . Lung nodule     a. Pt initially refused further workup. b. Per pulm note, resolved 02/19/14 and never smoker so no further f/u needed.  [name] History of noncompliance with medical treatment   . HBV (hepatitis B virus) infection   . Carotid stenosis     a. 40-59% bilateral ICA stenosis, repeat 1 year.  [name] PVC's (premature ventricular contractions)   1. GERD with hiatal hernia 2. HBV 3. HTN 4. Hyperlipidemia 5. CAD: NSTEMI 10/12 with 95% mLAD stenosis tx with Promus DES. EF 60% on LV-gram. Relook cath in 11/12 with patent LAD stent, EF 65%.  6. Medical noncompliance.  7. Carotid stenosis: carotid dopplers (4/13) 41-58% RICA, 30-94% LICA. Carotid dopplers (9/15) with 40-59% bilateral ICA stenosis.  8. Thyroid nodule.  9. PVCs/PACs: Holter (1/15)  with rare PACs/PVCs   Past Surgical History  Procedure Laterality Date  . Cesarean section    . Tonsillectomy    . Hemorroidectomy      1999  . Coronary stent placement  2012     Current Outpatient Prescriptions  Medication Sig Dispense Refill  . aspirin EC 81 MG tablet Take 1 tablet (81 mg total) by mouth every other day. 90 tablet 3  . lisinopril (PRINIVIL,ZESTRIL) 2.5 MG tablet Take 1 tablet (2.5 mg total) by mouth daily. 90 tablet 1  . losartan (COZAAR) 50 MG tablet Take one half tablet a day 45 tablet 3  . nitroGLYCERIN (NITROSTAT) 0.4 MG SL tablet Place 0.4 mg under the tongue every 5 (five) minutes as needed for chest pain.     No current  facility-administered medications for this visit.    Allergies:   Atorvastatin; Crestor; Ibuprofen; and Pravastatin sodium    Social History:  The patient  reports that she has never smoked. She has never used smokeless tobacco. She reports that she does not drink alcohol or use illicit drugs.   Family History:  The patient's family history includes Alcohol abuse in her father; Cancer (age of onset: 59) in her father; Diabetes in her sister and sister; Heart attack in her brother; Prostate cancer in her father. There is no history of Stroke.    ROS:  Please see the history of present illness.   Otherwise, review of systems are positive for none.   All other systems are reviewed and negative.    PHYSICAL EXAM: VS:  BP 154/54 mmHg  Pulse 76  Ht 4\' 8"  (1.422 m)  Wt 97 lb (43.999 kg)  BMI 21.76 kg/m2   BP by me:  L 152/78;  R 154/80  Wt Readings from Last 3 Encounters:  07/24/14 97 lb (43.999 kg)  05/22/14 97 lb 12.8 oz (44.362 kg)  04/17/14 97 lb (43.999 kg)     GEN: Well nourished, well developed, in no acute distress HEENT: normal Neck: no JVD, no masses Cardiac:  Normal S1/S2, RRR; no murmur, no rubs or gallops, no edema  Respiratory:  clear to auscultation bilaterally, no wheezing, rhonchi or rales. GI: soft, nontender, nondistended, + BS MS: no deformity or atrophy Skin: warm and dry  Neuro:  CNs II-XII intact, Strength and sensation are intact Psych: Normal affect   EKG:  EKG is ordered today.  It demonstrates:   NSR, HR 76, normal axis, NSSTTW changes, similar to prior tracings.   Recent Labs: 02/11/2014: TSH 0.862 04/01/2014: Platelets 358 07/18/2014: ALT 18; BUN 19; Creatinine 0.89; Hemoglobin 13.5; Potassium 4.2; Sodium 138    Lipid Panel    Component Value Date/Time   CHOL 207* 12/05/2013 1747   TRIG 98 12/05/2013 1747   HDL 70 12/05/2013 1747   CHOLHDL 3.0 12/05/2013 1747   VLDL 20 12/05/2013 1747   LDLCALC 117* 12/05/2013 1747      ASSESSMENT AND  PLAN:  1.  Back Pain:  Etiology not entirely clear.  She has had constant symptoms since 1:00 am. She has a hx of CAD and also has carotid disease.  She refuses to take most medications.  She tells me that she is taking an ASA.  She has some dyspepsia.  This is a chronic symptom.  She has non-specific changes on her ECG.  These may by a little more prominent, but for the most part they appear similar.  BPs in both arms are equal.      -  Labs today:  BMET, CBC, Troponin, DDimer.    -  Get CXR.    -  If DDimer elevated, arrange Chest CTA.    -  If troponin elevated, send to the ED for admission and possible cath.    -  If troponin normal, arrange ETT-Myoview.    -  If symptoms change or worsen, she should go to the ED regardless.  I encouraged her to take NTG if symptoms change. 2.  CAD:  Question if L scapular pain represents ischemia.  She is on ASA only.  She was recently changed to Losartan but is not taking it yet.  Her issues with medications are outlined above. 3.  Hypertension:  BP above target.  She is not sure when she will start the Losartan. 4.  Carotid Stenosis:  Continue ASA.  She refuses statin Rx.  FU ultrasound in 01/2015. 5.  Hyperlipidemia: She has been intolerant to statins in the past.  I am not certain if she would agree to a PCSK-9 inhibitor.  This could certainly be considered in the future.     **Of note, the patient is hesitant to proceed with any testing. She tells me that she will not get the CXR or the stress test.  I have encouraged her to proceed.  She plans to get the labs drawn.**  Current medicines are reviewed at length with the patient today.  The patient has concerns regarding medicines.  The following changes have been made:  As above.    Labs/ tests ordered today include:  Orders Placed This Encounter  Procedures  . DG Chest 2 View  . Basic Metabolic Panel (BMET)  . CBC with Differential  . D-Dimer, Quantitative  . Troponin I  . Myocardial Perfusion  Imaging  . EKG 12-Lead    Disposition:   FU with Dr. Loralie [name] in 2 weeks   Signed, Versie Starks, MHS 07/24/2014 11:02 AM    [address], [place], [place]  [address] Phone: [phone]; Fax: [phone]

## 2014-07-24 NOTE — Patient Instructions (Signed)
Your physician recommends that you return for lab work: TODAy (BMET, CBC, D-Dimer (STAT), Troponin (STAT))  Your physician recommends that you schedule a follow-up appointment in: 2 Weeks with Dr. Aundra Dubin  Your physician has requested that you have en exercise stress myoview. For further information please visit [email or web address]. Please follow instruction sheet, as given.  A chest x-ray takes a picture of the organs and structures inside the chest, including the heart, lungs, and blood vessels. This test can show several things, including, whether the heart is enlarges; whether fluid is building up in the lungs; and whether pacemaker / defibrillator leads are still in place.

## 2014-07-24 NOTE — Telephone Encounter (Signed)
Pt states she has heaviness in her back around her shoulder blades.  Pt states this has been constant since last night, pain is a 4 out of 10, not associated with any other symptoms except maybe a little chest discomfort. Pt states she has NTG but did not use it last night or today. Pt states she has never had heaviness in her back like this.   Discussed with Versie Starks and he will see pt today.  Pt advised I have scheduled appt with Nicki Reaper today and I advised pt not to drive to appt.

## 2014-07-29 NOTE — Patient Instructions (Signed)
Pt educated but refused Chest Xray and Exercise Stress Myoview.  Pt stated she only wanted to do labs from list of recommendations.

## 2014-09-17 ENCOUNTER — Encounter: Payer: Self-pay | Admitting: *Deleted

## 2014-10-16 ENCOUNTER — Ambulatory Visit: Payer: Medicare Other | Admitting: Nurse Practitioner

## 2014-10-23 ENCOUNTER — Telehealth: Payer: Self-pay | Admitting: Radiology

## 2014-10-23 ENCOUNTER — Ambulatory Visit (INDEPENDENT_AMBULATORY_CARE_PROVIDER_SITE_OTHER): Payer: Medicare Other | Admitting: Family Medicine

## 2014-10-23 VITALS — BP 116/74 | HR 60 | Temp 97.9°F | Resp 16 | Ht <= 58 in | Wt 96.0 lb

## 2014-10-23 DIAGNOSIS — R350 Frequency of micturition: Secondary | ICD-10-CM

## 2014-10-23 DIAGNOSIS — R1084 Generalized abdominal pain: Secondary | ICD-10-CM

## 2014-10-23 DIAGNOSIS — R197 Diarrhea, unspecified: Secondary | ICD-10-CM | POA: Diagnosis not present

## 2014-10-23 DIAGNOSIS — K219 Gastro-esophageal reflux disease without esophagitis: Secondary | ICD-10-CM | POA: Diagnosis not present

## 2014-10-23 LAB — POCT URINALYSIS DIPSTICK
Bilirubin, UA: NEGATIVE
Blood, UA: NEGATIVE
Glucose, UA: NEGATIVE
NITRITE UA: NEGATIVE
PROTEIN UA: 30
Spec Grav, UA: 1.015
Urobilinogen, UA: 0.2
pH, UA: 7.5

## 2014-10-23 LAB — COMPREHENSIVE METABOLIC PANEL
ALT: 24 U/L (ref 0–35)
AST: 23 U/L (ref 0–37)
Albumin: 4.5 g/dL (ref 3.5–5.2)
Alkaline Phosphatase: 57 U/L (ref 39–117)
BILIRUBIN TOTAL: 0.6 mg/dL (ref 0.2–1.2)
BUN: 12 mg/dL (ref 6–23)
CO2: 22 mEq/L (ref 19–32)
CREATININE: 0.8 mg/dL (ref 0.50–1.10)
Calcium: 9.5 mg/dL (ref 8.4–10.5)
Chloride: 102 mEq/L (ref 96–112)
Glucose, Bld: 103 mg/dL — ABNORMAL HIGH (ref 70–99)
Potassium: 4.2 mEq/L (ref 3.5–5.3)
SODIUM: 140 meq/L (ref 135–145)
Total Protein: 7.3 g/dL (ref 6.0–8.3)

## 2014-10-23 LAB — POCT UA - MICROSCOPIC ONLY
Bacteria, U Microscopic: NEGATIVE
CASTS, UR, LPF, POC: NEGATIVE
Crystals, Ur, HPF, POC: NEGATIVE
YEAST UA: NEGATIVE

## 2014-10-23 LAB — THYROID PANEL WITH TSH
FREE THYROXINE INDEX: 2.5 (ref 1.4–3.8)
T3 Uptake: 28 % (ref 22–35)
T4 TOTAL: 8.8 ug/dL (ref 4.5–12.0)
TSH: 1.241 u[IU]/mL (ref 0.350–4.500)

## 2014-10-23 LAB — IFOBT (OCCULT BLOOD): IFOBT: NEGATIVE

## 2014-10-23 LAB — AMYLASE: Amylase: 66 U/L (ref 0–105)

## 2014-10-23 LAB — LIPASE: LIPASE: 21 U/L (ref 0–75)

## 2014-10-23 NOTE — Progress Notes (Addendum)
Subjective:  This chart was scribed for Delman Cheadle MD, by Tamsen Roers, at [facility].  This patient was seen in room 8 and the patient's care was started at 9:28 AM.    Patient ID: [name], female    DOB: [birth year], 69 y.o.   MRN: [id number] Chief Complaint  Patient presents with  . Abdominal Pain    x6 days     HPI HPI Comments: [name] is a 69 y.o. female who presents to the [facility] complaining of abdominal pain onset six days ago ever since she has been taking Lisinopril.  She has bowel movements 2 times a day (her normal is 1x a day)  and has occasional diarrhea/gas and stomach growling.  She states that she has heart burn all the time and drinks milk for relief.  She denies fever/chills, weight change, nausea/vomitting or appetite change, dysuria, frequency/urgency changes.  She has not been taking any new over the counter medications.  She is not taking any pro biotics or vitamin supplements.  ---------- She does not have any prior GI imaging or significant GI history.   LFT's checked 3 months ago were normal.  She wen to a cardiologist for back pain.      Past Medical History  Diagnosis Date  . GERD (gastroesophageal reflux disease)   . Hiatal hernia   . Arthritis   . HTN (hypertension)   . HLD (hyperlipidemia)   . CAD (coronary artery disease)     a. s/p NSTEMI 10/12: LHC 04/02/11: mLAD 95%, EF 60%; PCI: Promus DES to mLAD. b. LHC 04/29/11:  LAD stent patent, proximal RCA 25%, EF 65%   . NSTEMI (non-ST elevated myocardial infarction) 2012  . Multinodular thyroid   . Lung nodule     a. Pt initially refused further workup. b. Per pulm note, resolved 02/19/14 and never smoker so no further f/u needed.  [name] History of noncompliance with medical treatment   . HBV (hepatitis B virus) infection   . Carotid stenosis     a. 40-59% bilateral ICA stenosis, repeat 1 year.  [name] PVC's (premature ventricular  contractions)     Current Outpatient Prescriptions on File Prior to Visit  Medication Sig Dispense Refill  . aspirin EC 81 MG tablet Take 1 tablet (81 mg total) by mouth every other day. 90 tablet 3  . lisinopril (PRINIVIL,ZESTRIL) 2.5 MG tablet Take 1 tablet (2.5 mg total) by mouth daily. 90 tablet 1  . nitroGLYCERIN (NITROSTAT) 0.4 MG SL tablet Place 0.4 mg under the tongue every 5 (five) minutes as needed for chest pain.    [name] losartan (COZAAR) 50 MG tablet Take one half tablet a day (Patient not taking: Reported on 10/23/2014) 45 tablet 3   No current facility-administered medications on file prior to visit.    Allergies  Allergen Reactions  . Atorvastatin Other (See Comments)    Muscle pain  . Crestor [Rosuvastatin] Other (See Comments)    Muscle pain  . Ibuprofen     REACTION: rapid heart rate  . Pravastatin Sodium Other (See Comments)    Muscle cramps/ confusion      Review of Systems  Constitutional: Negative for fever, chills, appetite change and unexpected weight change.  Gastrointestinal: Positive for abdominal pain. Negative for nausea and vomiting.  Genitourinary: Negative for dysuria, urgency and frequency.       Objective:   Physical Exam  Constitutional: She appears well-developed and well-nourished.  No distress.  HENT:  Head: Normocephalic and atraumatic.  Eyes: Right eye exhibits no discharge. Left eye exhibits no discharge.  Neck:  Thyroid: Question of very small amount of enlargment on left lateral.    Cardiovascular: Normal rate, regular rhythm, S1 normal, S2 normal and normal heart sounds.   No murmur heard. Pulmonary/Chest: Effort normal and breath sounds normal. No respiratory distress. She has no wheezes. She has no rales.  Lungs clear to auscultation.  No cva tenderness.   Abdominal: Soft. Bowel sounds are normal. She exhibits no mass. There is no tenderness. There is no rebound and no guarding.  Prominent bowel sounds.  No hepatosplenomegaly.    Genitourinary:  3 Small external hemorroid skin tags Normal internal tone  No masses Min amount of stool, no gross blood.   Lymphadenopathy:    She has no cervical adenopathy.  Skin: She is not diaphoretic.  Nursing note and vitals reviewed.   Filed Vitals:   10/23/14 0823  BP: 116/74  Pulse: 60  Temp: 97.9 F (36.6 C)  TempSrc: Oral  Resp: 16  Height: 4\' 8"  (1.422 m)  Weight: 96 lb (43.545 kg)  SpO2: 98%    Results for orders placed or performed in visit on 10/23/14  IFOBT POC (occult bld, rslt in office)  Result Value Ref Range   IFOBT Negative   POCT UA - Microscopic Only  Result Value Ref Range   WBC, Ur, HPF, POC 0-1    RBC, urine, microscopic 1-4    Bacteria, U Microscopic neg    Mucus, UA moderate    Epithelial cells, urine per micros 0-1    Crystals, Ur, HPF, POC neg    Casts, Ur, LPF, POC neg    Yeast, UA neg    Amorphous large   POCT urinalysis dipstick  Result Value Ref Range   Color, UA yellow    Clarity, UA clear    Glucose, UA neg    Bilirubin, UA neg    Ketones, UA trace    Spec Grav, UA 1.015    Blood, UA neg    pH, UA 7.5    Protein, UA 30    Urobilinogen, UA 0.2    Nitrite, UA neg    Leukocytes, UA Trace           Assessment & Plan:   1. Generalized abdominal pain   2. Diarrhea   3. Urinary frequency   4. Gastroesophageal reflux disease, esophagitis presence not specified   Recommended pt have acute abd xray but she delines - she really just wants to make sure she has full LFTs.  Orders Placed This Encounter  Procedures  . Urine culture  . Comprehensive metabolic panel  . Thyroid Panel With TSH  . Lipase  . Amylase  . IFOBT POC (occult bld, rslt in office)  . POCT UA - Microscopic Only  . POCT urinalysis dipstick     I personally performed the services described in this documentation, which was scribed in my presence. The recorded information has been reviewed and considered, and addended by me as needed.  Delman Cheadle,  MD MPH

## 2014-10-23 NOTE — Telephone Encounter (Signed)
LFTs are a part of the cmp - the cmp is the basic metabolic plus the liver function so has already been ordered.

## 2014-10-23 NOTE — Telephone Encounter (Signed)
Pt would like to know if we could add on liver function tests to her bloodwork she had done today.   [phone]

## 2014-10-24 LAB — URINE CULTURE

## 2014-10-29 NOTE — Telephone Encounter (Signed)
Pt wanting her lab results.

## 2014-10-29 NOTE — Telephone Encounter (Signed)
Spoke with pt, advised message from Dr. Brigitte Pulse. Pt understood.

## 2014-10-29 NOTE — Telephone Encounter (Signed)
All normal. Letter was sent in mail so she can have a copy.

## 2014-11-11 ENCOUNTER — Telehealth: Payer: Self-pay

## 2014-11-11 ENCOUNTER — Other Ambulatory Visit: Payer: Self-pay | Admitting: Radiology

## 2014-11-11 NOTE — Telephone Encounter (Signed)
Will send letter.

## 2014-11-11 NOTE — Telephone Encounter (Signed)
Patient called again about her lab results.  She spoke to [place] last on May 18th and was told her labs were normal and that we were sending her a letter with the results.  I do not see that letter in her chart.  Please send her one with the results asap

## 2015-01-15 ENCOUNTER — Other Ambulatory Visit: Payer: Self-pay | Admitting: Cardiology

## 2015-01-16 ENCOUNTER — Other Ambulatory Visit: Payer: Self-pay

## 2015-01-16 MED ORDER — NITROGLYCERIN 0.4 MG SL SUBL: 0.4000 mg | SUBLINGUAL_TABLET | SUBLINGUAL | Status: DC | PRN

## 2015-02-17 IMAGING — CR DG CHEST 2V
2 series · 2 of 2 positions shown · non-contrast
Comparison: PA and lateral chest 05/31/2013 and 04/07/2011.

CLINICAL DATA: History of pulmonary nodule.

EXAM:
CHEST  2 VIEW

[view not recorded (1 of 2)]
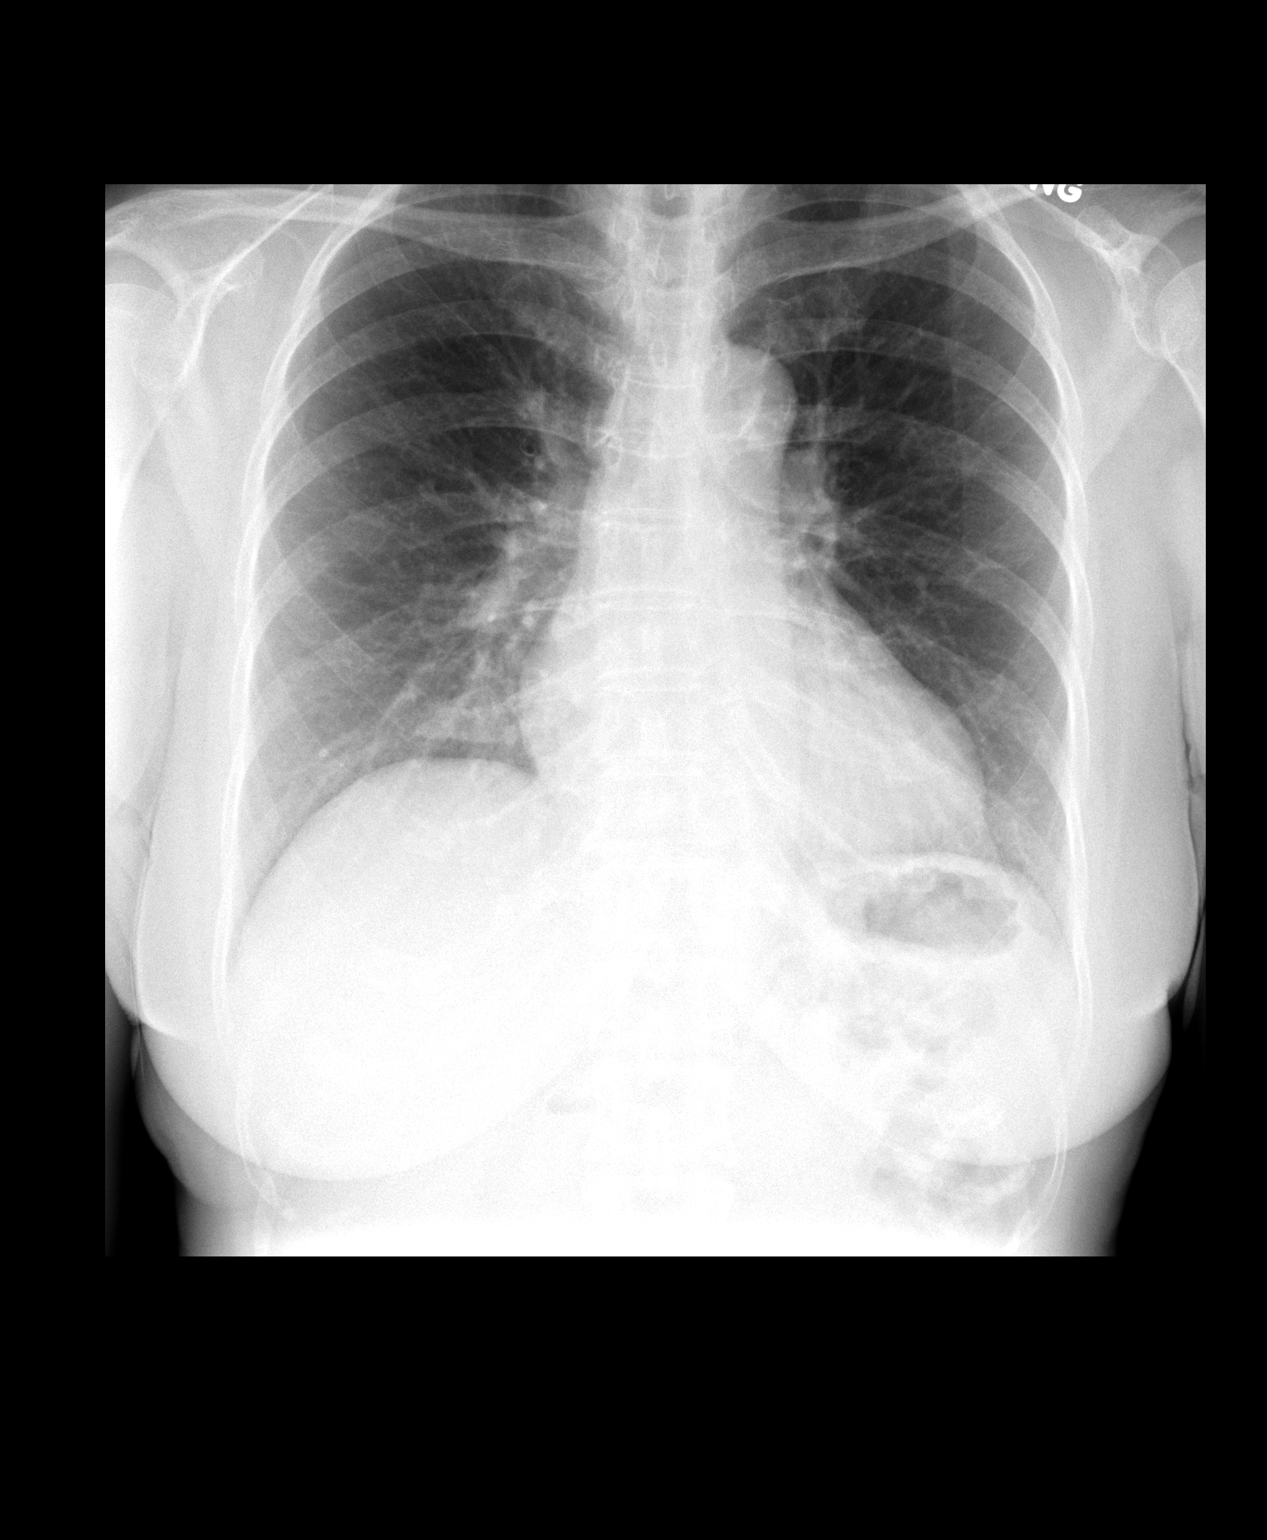

[view not recorded (2 of 2)]
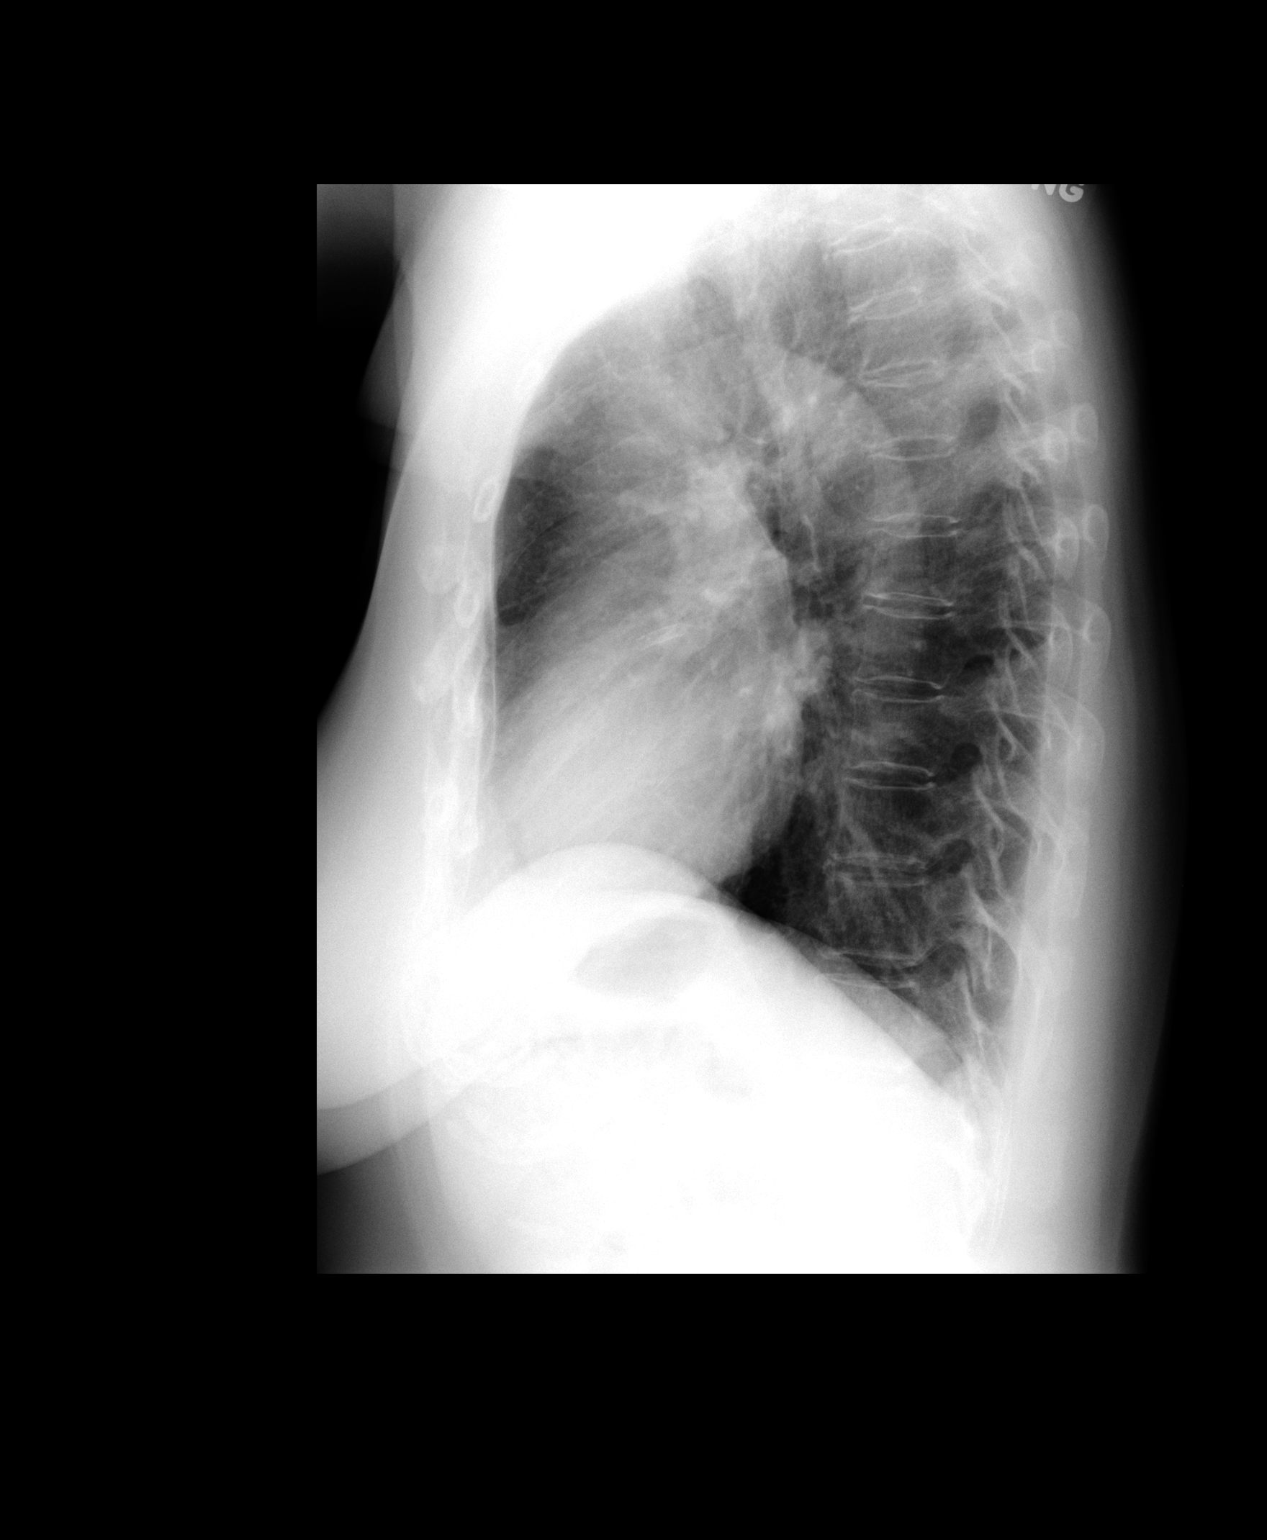

[2 of 2 positions shown; findings below may reference images not displayed]

FINDINGS: Possible right upper lobe nodule seen on the most recent comparison
plain films is not visible on today's study. The lungs are clear.
Heart size is upper normal. There is no pneumothorax or pleural
effusion.
IMPRESSION: Negative for pulmonary nodule.  No acute disease.

## 2015-03-28 ENCOUNTER — Encounter: Payer: Self-pay | Admitting: Family Medicine

## 2015-03-28 ENCOUNTER — Ambulatory Visit (INDEPENDENT_AMBULATORY_CARE_PROVIDER_SITE_OTHER): Payer: Medicare Other | Admitting: Family Medicine

## 2015-03-28 VITALS — BP 128/72 | HR 82 | Temp 97.8°F | Resp 16 | Ht <= 58 in | Wt 98.4 lb

## 2015-03-28 DIAGNOSIS — R197 Diarrhea, unspecified: Secondary | ICD-10-CM

## 2015-03-28 DIAGNOSIS — R002 Palpitations: Secondary | ICD-10-CM | POA: Diagnosis not present

## 2015-03-28 DIAGNOSIS — R109 Unspecified abdominal pain: Secondary | ICD-10-CM | POA: Diagnosis not present

## 2015-03-28 DIAGNOSIS — R079 Chest pain, unspecified: Secondary | ICD-10-CM

## 2015-03-28 LAB — POCT CBC
Granulocyte percent: 72.5 %G (ref 37–80)
HEMATOCRIT: 39.3 % (ref 37.7–47.9)
HEMOGLOBIN: 13.6 g/dL (ref 12.2–16.2)
Lymph, poc: 1.1 (ref 0.6–3.4)
MCH, POC: 30.7 pg (ref 27–31.2)
MCHC: 34.6 g/dL (ref 31.8–35.4)
MCV: 88.6 fL (ref 80–97)
MID (cbc): 0.4 (ref 0–0.9)
MPV: 7.9 fL (ref 0–99.8)
POC GRANULOCYTE: 3.9 (ref 2–6.9)
POC LYMPH PERCENT: 20.9 %L (ref 10–50)
POC MID %: 6.6 % (ref 0–12)
Platelet Count, POC: 384 10*3/uL (ref 142–424)
RBC: 4.44 M/uL (ref 4.04–5.48)
RDW, POC: 13 %
WBC: 5.4 10*3/uL (ref 4.6–10.2)

## 2015-03-28 LAB — COMPREHENSIVE METABOLIC PANEL
ALT: 22 U/L (ref 6–29)
AST: 22 U/L (ref 10–35)
Albumin: 4.6 g/dL (ref 3.6–5.1)
Alkaline Phosphatase: 52 U/L (ref 33–130)
BILIRUBIN TOTAL: 0.7 mg/dL (ref 0.2–1.2)
BUN: 17 mg/dL (ref 7–25)
CO2: 27 mmol/L (ref 20–31)
CREATININE: 0.93 mg/dL (ref 0.50–0.99)
Calcium: 9.7 mg/dL (ref 8.6–10.4)
Chloride: 102 mmol/L (ref 98–110)
Glucose, Bld: 110 mg/dL — ABNORMAL HIGH (ref 65–99)
Potassium: 4.3 mmol/L (ref 3.5–5.3)
SODIUM: 140 mmol/L (ref 135–146)
TOTAL PROTEIN: 7.6 g/dL (ref 6.1–8.1)

## 2015-03-28 LAB — HEMOCCULT GUIAC POC 1CARD (OFFICE): Fecal Occult Blood, POC: NEGATIVE

## 2015-03-28 LAB — POCT URINALYSIS DIP (MANUAL ENTRY)
Bilirubin, UA: NEGATIVE
Glucose, UA: NEGATIVE
Leukocytes, UA: NEGATIVE
NITRITE UA: NEGATIVE
Protein Ur, POC: NEGATIVE
Spec Grav, UA: 1.02
UROBILINOGEN UA: 0.2
pH, UA: 6

## 2015-03-28 LAB — POC MICROSCOPIC URINALYSIS (UMFC): MUCUS RE: ABSENT

## 2015-03-28 NOTE — Progress Notes (Signed)
Subjective:    Patient ID: [name], female    DOB: August 26, 1945, 69 y.o.   MRN: [id number] This chart was scribed for Delman Cheadle, MD by Marti Sleigh, Medical Scribe. This patient was seen in Room 5 and the patient's care was started a 9:01 AM.  Chief Complaint  Patient presents with  . Palpitations    This am, took a nitroglycerin. Worried she may have had another MI    HPI  HPI Comments: [name] is a 69 y.o. female who presents to [facility] complaining of palpitations this morning. She is not having at this time. She took a nitroglycerin this morning because she was concerned she was having another MI. She denies chest pain, but states she has had abdominal pain, worse while she is having a BM, for the last two weeks with associated constipation and bloating. She states she has multiple bowel movements per day and urinates frequently throughout the day. She denies feeling light headed or dizzy. She is eating normally. She is not taking any daily medications. She is taking an aspirin every other day. She states the last time she had palpitations was one month ago.  She is seen by cariology, Dr. Aundra Dubin. Last seen in their office eight months prior. She has hx of CAD s/p NSTEMI 04/2011 treated with DES to mid LAD. She also has carotid stenosis, HTN, HL, GERD, PAC/PVCs, GERD, HTN, and HLD. She is poorly compliant with meds refusing multiple interventions. Due to persistent chest pain she had a cardiac catheterization in November 2012.   Past Medical History  Diagnosis Date  . GERD (gastroesophageal reflux disease)   . Hiatal hernia   . Arthritis   . HTN (hypertension)   . HLD (hyperlipidemia)   . CAD (coronary artery disease)     a. s/p NSTEMI 10/12: LHC 04/02/11: mLAD 95%, EF 60%; PCI: Promus DES to mLAD. b. LHC 04/29/11:  LAD stent patent, proximal RCA 25%, EF 65%   . NSTEMI (non-ST elevated myocardial infarction) ([place]) 2012  . Multinodular thyroid   . Lung nodule     a.  Pt initially refused further workup. b. Per pulm note, resolved 02/19/14 and never smoker so no further f/u needed.  [name] History of noncompliance with medical treatment   . HBV (hepatitis B virus) infection   . Carotid stenosis     a. 40-59% bilateral ICA stenosis, repeat 1 year.  [name] PVC's (premature ventricular contractions)    Allergies  Allergen Reactions  . Atorvastatin Other (See Comments)    Muscle pain  . Crestor [Rosuvastatin] Other (See Comments)    Muscle pain  . Ibuprofen     REACTION: rapid heart rate  . Pravastatin Sodium Other (See Comments)    Muscle cramps/ confusion   Current Outpatient Prescriptions on File Prior to Visit  Medication Sig Dispense Refill  . aspirin EC 81 MG tablet Take 1 tablet (81 mg total) by mouth every other day. 90 tablet 3  . lisinopril (PRINIVIL,ZESTRIL) 2.5 MG tablet Take 1 tablet (2.5 mg total) by mouth daily. 90 tablet 1  . nitroGLYCERIN (NITROSTAT) 0.4 MG SL tablet Place 1 tablet (0.4 mg total) under the tongue every 5 (five) minutes as needed for chest pain. 25 tablet 3   No current facility-administered medications on file prior to visit.    Review of Systems  Constitutional: Negative for fever, chills, activity change, appetite change and unexpected weight change.  Respiratory: Negative for chest tightness and shortness of  breath.   Cardiovascular: Positive for palpitations. Negative for chest pain and leg swelling.  Gastrointestinal: Positive for abdominal pain, diarrhea, constipation and abdominal distention. Negative for nausea, vomiting, blood in stool, anal bleeding and rectal pain.       Bloating  Endocrine: Negative for polydipsia, polyphagia and polyuria.  Genitourinary: Positive for frequency. Negative for flank pain.  Musculoskeletal: Negative for myalgias.  Neurological: Negative for dizziness, syncope, facial asymmetry, light-headedness and headaches.       Objective:  BP 128/72 mmHg  Pulse 82  Temp(Src) 97.8 F (36.6  C) (Oral)  Resp 16  Ht 4\' 8"  (1.422 m)  Wt 98 lb 6.4 oz (44.634 kg)  BMI 22.07 kg/m2  SpO2 99%  Physical Exam  Constitutional: She is oriented to person, place, and time. She appears well-developed and well-nourished. No distress.  HENT:  Head: Normocephalic and atraumatic.  Eyes: Pupils are equal, round, and reactive to light.  Neck: Neck supple.  Cardiovascular: Normal rate, regular rhythm, S1 normal, S2 normal and normal heart sounds.   No murmur heard. Pulmonary/Chest: Effort normal and breath sounds normal. No respiratory distress. She has no wheezes.  Lungs clear  Abdominal: Soft. There is no tenderness.  Hyperactive bowel sounds. Soft, non distended, no hepatosplenomegaly.  Musculoskeletal: Normal range of motion.  Neurological: She is alert and oriented to person, place, and time. Coordination normal.  Skin: Skin is warm and dry. She is not diaphoretic.  Psychiatric: She has a normal mood and affect. Her behavior is normal.  Nursing note and vitals reviewed.  EKG read by Dr. Brigitte Pulse: Normal sinus rhythm without ischemic changes. Flipped t-waves in medial chest leads.     Assessment & Plan:   1. Chest pain, unspecified chest pain type   2. Palpitations   3. Abdominal pain, unspecified abdominal location   4. Diarrhea, unspecified type     Orders Placed This Encounter  Procedures  . Stool culture  . Ova and parasite examination  . Comprehensive metabolic panel  . Fecal Lactoferrin  . POCT Microscopic Urinalysis ([facility])  . POCT urinalysis dipstick  . POCT CBC  . Hemoccult - 1 Card (office)  . EKG 12-Lead   Spent 10 minutes discussing medication compliance with pt. She is going to continue to exercise regularly and follow excellent dietary patterns but is not going to take any regular medications. She has no interest in taking chronic medications even if it may increase the length of her life, because she states while it may increase the quantity of her life it  decreases the quality of her life. I feel that she is making a rational and well considered decision. She was encouraged to continue to follow up as she developed any sx which she is happy and willing to do.   I personally performed the services described in this documentation, which was scribed in my presence. The recorded information has been reviewed and considered, and addended by me as needed.  Delman Cheadle, MD MPH    By signing my name below, I, Judithe Modest, attest that this documentation has been prepared under the direction and in the presence of Delman Cheadle, MD. Electronically Signed: Judithe Modest, ER Scribe. 03/28/2015. 9:02 AM.  Results for orders placed or performed in visit on 03/28/15  Stool culture  Result Value Ref Range   Preliminary Report No Suspicious Colonies, Continuing to Hold   Ova and parasite examination  Result Value Ref Range   OP No Ova or Parasites Seen  Comprehensive metabolic panel  Result Value Ref Range   Sodium 140 135 - 146 mmol/L   Potassium 4.3 3.5 - 5.3 mmol/L   Chloride 102 98 - 110 mmol/L   CO2 27 20 - 31 mmol/L   Glucose, Bld 110 (H) 65 - 99 mg/dL   BUN 17 7 - 25 mg/dL   Creat 0.93 0.50 - 0.99 mg/dL   Total Bilirubin 0.7 0.2 - 1.2 mg/dL   Alkaline Phosphatase 52 33 - 130 U/L   AST 22 10 - 35 U/L   ALT 22 6 - 29 U/L   Total Protein 7.6 6.1 - 8.1 g/dL   Albumin 4.6 3.6 - 5.1 g/dL   Calcium 9.7 8.6 - 10.4 mg/dL  Fecal Lactoferrin  Result Value Ref Range   Lactoferrin NEGATIVE   POCT Microscopic Urinalysis ([facility])  Result Value Ref Range   WBC,UR,HPF,POC Few (A) None WBC/hpf   RBC,UR,HPF,POC Few (A) None RBC/hpf   Bacteria None None   Mucus Absent Absent   Epithelial Cells, UR Per Microscopy None None cells/hpf  POCT urinalysis dipstick  Result Value Ref Range   Color, UA other (A) yellow   Clarity, UA clear clear   Glucose, UA negative negative   Bilirubin, UA negative negative   Ketones, POC UA trace (5) (A) negative   Spec  Grav, UA 1.020    Blood, UA trace-lysed (A) negative   pH, UA 6.0    Protein Ur, POC negative negative   Urobilinogen, UA 0.2    Nitrite, UA Negative Negative   Leukocytes, UA Negative Negative  POCT CBC  Result Value Ref Range   WBC 5.4 4.6 - 10.2 K/uL   Lymph, poc 1.1 0.6 - 3.4   POC LYMPH PERCENT 20.9 10 - 50 %L   MID (cbc) 0.4 0 - 0.9   POC MID % 6.6 0 - 12 %M   POC Granulocyte 3.9 2 - 6.9   Granulocyte percent 72.5 37 - 80 %G   RBC 4.44 4.04 - 5.48 M/uL   Hemoglobin 13.6 12.2 - 16.2 g/dL   HCT, POC 39.3 37.7 - 47.9 %   MCV 88.6 80 - 97 fL   MCH, POC 30.7 27 - 31.2 pg   MCHC 34.6 31.8 - 35.4 g/dL   RDW, POC 13.0 %   Platelet Count, POC 384.0 142 - 424 K/uL   MPV 7.9 0 - 99.8 fL  Hemoccult - 1 Card (office)  Result Value Ref Range   Fecal Occult Blood, POC Negative Negative   Card #1 Date 03/28/2015    Card #2 Fecal Occult Blod, POC     Card #2 Date     Card #3 Fecal Occult Blood, POC     Card #3 Date

## 2015-03-28 NOTE — Patient Instructions (Signed)

## 2015-03-31 ENCOUNTER — Encounter: Payer: Self-pay | Admitting: Family Medicine

## 2015-03-31 DIAGNOSIS — R197 Diarrhea, unspecified: Secondary | ICD-10-CM | POA: Diagnosis not present

## 2015-03-31 DIAGNOSIS — R109 Unspecified abdominal pain: Secondary | ICD-10-CM | POA: Diagnosis not present

## 2015-04-01 LAB — OVA AND PARASITE EXAMINATION: OP: NONE SEEN

## 2015-04-01 LAB — FECAL LACTOFERRIN, QUANT: Lactoferrin: NEGATIVE

## 2015-04-04 LAB — STOOL CULTURE

## 2015-04-14 ENCOUNTER — Telehealth: Payer: Self-pay

## 2015-04-14 NOTE — Telephone Encounter (Signed)
Pt called about stool studies. Let her know they were all neg and copy sent

## 2015-07-15 ENCOUNTER — Other Ambulatory Visit: Payer: Self-pay | Admitting: Emergency Medicine

## 2015-11-28 ENCOUNTER — Other Ambulatory Visit: Payer: Self-pay

## 2015-11-28 ENCOUNTER — Inpatient Hospital Stay (HOSPITAL_COMMUNITY)
Admission: EM | Admit: 2015-11-28 | Discharge: 2015-12-01 | DRG: 247 | Disposition: A | Discharge status: Home / Self Care | Payer: Medicare Other | Attending: Internal Medicine | Admitting: Internal Medicine

## 2015-11-28 ENCOUNTER — Encounter (HOSPITAL_COMMUNITY): Payer: Self-pay

## 2015-11-28 ENCOUNTER — Emergency Department (HOSPITAL_COMMUNITY): Payer: Medicare Other

## 2015-11-28 DIAGNOSIS — I2510 Atherosclerotic heart disease of native coronary artery without angina pectoris: Secondary | ICD-10-CM | POA: Diagnosis not present

## 2015-11-28 DIAGNOSIS — Z888 Allergy status to other drugs, medicaments and biological substances status: Secondary | ICD-10-CM

## 2015-11-28 DIAGNOSIS — I252 Old myocardial infarction: Secondary | ICD-10-CM | POA: Diagnosis present

## 2015-11-28 DIAGNOSIS — I6523 Occlusion and stenosis of bilateral carotid arteries: Secondary | ICD-10-CM | POA: Diagnosis not present

## 2015-11-28 DIAGNOSIS — I214 Non-ST elevation (NSTEMI) myocardial infarction: Secondary | ICD-10-CM | POA: Diagnosis not present

## 2015-11-28 DIAGNOSIS — R079 Chest pain, unspecified: Secondary | ICD-10-CM | POA: Diagnosis not present

## 2015-11-28 DIAGNOSIS — I10 Essential (primary) hypertension: Secondary | ICD-10-CM | POA: Diagnosis not present

## 2015-11-28 DIAGNOSIS — Z955 Presence of coronary angioplasty implant and graft: Secondary | ICD-10-CM | POA: Diagnosis not present

## 2015-11-28 DIAGNOSIS — E785 Hyperlipidemia, unspecified: Secondary | ICD-10-CM | POA: Diagnosis not present

## 2015-11-28 DIAGNOSIS — Z8052 Family history of malignant neoplasm of bladder: Secondary | ICD-10-CM

## 2015-11-28 DIAGNOSIS — I200 Unstable angina: Secondary | ICD-10-CM | POA: Diagnosis not present

## 2015-11-28 DIAGNOSIS — Z8249 Family history of ischemic heart disease and other diseases of the circulatory system: Secondary | ICD-10-CM | POA: Diagnosis not present

## 2015-11-28 DIAGNOSIS — M549 Dorsalgia, unspecified: Secondary | ICD-10-CM | POA: Diagnosis not present

## 2015-11-28 LAB — BASIC METABOLIC PANEL
Anion gap: 9 (ref 5–15)
BUN: 12 mg/dL (ref 6–20)
CALCIUM: 8.6 mg/dL — AB (ref 8.9–10.3)
CO2: 23 mmol/L (ref 22–32)
CREATININE: 0.85 mg/dL (ref 0.44–1.00)
Chloride: 105 mmol/L (ref 101–111)
GLUCOSE: 149 mg/dL — AB (ref 65–99)
Potassium: 3.6 mmol/L (ref 3.5–5.1)
Sodium: 137 mmol/L (ref 135–145)

## 2015-11-28 LAB — CBC
HCT: 38.8 % (ref 36.0–46.0)
Hemoglobin: 12.6 g/dL (ref 12.0–15.0)
MCH: 29.7 pg (ref 26.0–34.0)
MCHC: 32.5 g/dL (ref 30.0–36.0)
MCV: 91.5 fL (ref 78.0–100.0)
PLATELETS: 392 10*3/uL (ref 150–400)
RBC: 4.24 MIL/uL (ref 3.87–5.11)
RDW: 12.4 % (ref 11.5–15.5)
WBC: 5.6 10*3/uL (ref 4.0–10.5)

## 2015-11-28 LAB — I-STAT TROPONIN, ED: TROPONIN I, POC: 0.01 ng/mL (ref 0.00–0.08)

## 2015-11-28 LAB — TROPONIN I
TROPONIN I: 30.89 ng/mL — AB (ref <0.031)
TROPONIN I: 19.55 ng/mL — AB (ref <0.031)
Troponin I: <0.03 ng/mL (ref <0.031)
Troponin I: 0.5 ng/mL (ref <0.031)

## 2015-11-28 LAB — PROTIME-INR
INR: 1.11 (ref 0.00–1.49)
Prothrombin Time: 14.5 seconds (ref 11.6–15.2)

## 2015-11-28 LAB — TSH: TSH: 2.029 u[IU]/mL (ref 0.350–4.500)

## 2015-11-28 LAB — HEPARIN LEVEL (UNFRACTIONATED): Heparin Unfractionated: 0.88 IU/mL — ABNORMAL HIGH (ref 0.30–0.70)

## 2015-11-28 MED ORDER — CARVEDILOL 3.125 MG PO TABS
3.1250 mg | ORAL_TABLET | Freq: Two times a day (BID) | ORAL | Status: DC
  Administered 2015-11-28 – 2015-12-01 (×4): 3.125 mg via ORAL
  Filled 2015-11-28 (×6): qty 1

## 2015-11-28 MED ORDER — SODIUM CHLORIDE 0.9 % IV SOLN: 250.0000 mL | INTRAVENOUS | Status: DC | PRN

## 2015-11-28 MED ORDER — SODIUM CHLORIDE 0.9% FLUSH: 3.0000 mL | INTRAVENOUS | Status: DC | PRN

## 2015-11-28 MED ORDER — ALPRAZOLAM 0.25 MG PO TABS: 0.2500 mg | ORAL_TABLET | Freq: Two times a day (BID) | ORAL | Status: DC | PRN

## 2015-11-28 MED ORDER — HEPARIN BOLUS VIA INFUSION
2000.0000 [IU] | Freq: Once | INTRAVENOUS | Status: AC
  Administered 2015-11-28: 2000 [IU] via INTRAVENOUS
  Filled 2015-11-28: qty 2000

## 2015-11-28 MED ORDER — NITROGLYCERIN 2 % TD OINT
1.0000 [in_us] | TOPICAL_OINTMENT | Freq: Four times a day (QID) | TRANSDERMAL | Status: DC
  Administered 2015-11-28 – 2015-11-30 (×6): 1 [in_us] via TOPICAL
  Filled 2015-11-28: qty 30

## 2015-11-28 MED ORDER — ASPIRIN 81 MG PO CHEW
162.0000 mg | CHEWABLE_TABLET | ORAL | Status: AC
  Administered 2015-11-28: 162 mg via ORAL

## 2015-11-28 MED ORDER — ONDANSETRON HCL 4 MG/2ML IJ SOLN: 4.0000 mg | Freq: Four times a day (QID) | INTRAMUSCULAR | Status: DC | PRN

## 2015-11-28 MED ORDER — ACETAMINOPHEN 325 MG PO TABS: 650.0000 mg | ORAL_TABLET | ORAL | Status: DC | PRN

## 2015-11-28 MED ORDER — HEPARIN (PORCINE) IN NACL 100-0.45 UNIT/ML-% IJ SOLN
500.0000 [IU]/h | INTRAMUSCULAR | Status: DC
  Administered 2015-11-28: 750 [IU]/h via INTRAVENOUS
  Administered 2015-11-30: 500 [IU]/h via INTRAVENOUS
  Filled 2015-11-28 (×2): qty 250

## 2015-11-28 MED ORDER — SODIUM CHLORIDE 0.9% FLUSH: 3.0000 mL | Freq: Two times a day (BID) | INTRAVENOUS | Status: DC

## 2015-11-28 MED ORDER — ASPIRIN 81 MG PO CHEW
162.0000 mg | CHEWABLE_TABLET | Freq: Once | ORAL | Status: AC
  Administered 2015-11-28: 162 mg via ORAL
  Filled 2015-11-28: qty 2

## 2015-11-28 MED ORDER — NITROGLYCERIN 0.4 MG SL SUBL: 0.4000 mg | SUBLINGUAL_TABLET | SUBLINGUAL | Status: DC | PRN

## 2015-11-28 MED ORDER — SODIUM CHLORIDE 0.9% FLUSH
3.0000 mL | Freq: Two times a day (BID) | INTRAVENOUS | Status: DC
  Administered 2015-11-30: 3 mL via INTRAVENOUS

## 2015-11-28 MED ORDER — NITROGLYCERIN 0.4 MG SL SUBL
0.4000 mg | SUBLINGUAL_TABLET | SUBLINGUAL | Status: DC | PRN
Start: 2015-11-28 — End: 2015-11-28
  Administered 2015-11-28: 0.4 mg via SUBLINGUAL
  Filled 2015-11-28: qty 1

## 2015-11-28 MED ORDER — ZOLPIDEM TARTRATE 5 MG PO TABS: 5.0000 mg | ORAL_TABLET | Freq: Every evening | ORAL | Status: DC | PRN

## 2015-11-28 MED ORDER — ASPIRIN 300 MG RE SUPP: 300.0000 mg | RECTAL | Status: AC

## 2015-11-28 NOTE — ED Provider Notes (Signed)
CSN: [id number]     Arrival date & time 11/28/15  [id number] History   First MD Initiated Contact with Patient 11/28/15 0737     Chief Complaint  Patient presents with  . Chest Pain    HPI  Patient presents with concern of chest and back pain. Symptoms began 90 minutes ago, while the patient was walking in her garden. Patient was well prior to the onset. Pain is 5/10, sore in anterior and posterior areas, with no radiation. No lightheadedness, no syncope, no dyspnea. No clear alleviating or exacerbating factors. Patient hasn't taken any medication for pain relief. Patient is a notable history of MI 4 years ago, with stent. Patient does not take Plavix, aspirin or any other medication.   Past Medical History  Diagnosis Date  . GERD (gastroesophageal reflux disease)   . Hiatal hernia   . Arthritis   . HTN (hypertension)   . HLD (hyperlipidemia)   . CAD (coronary artery disease)     a. s/p NSTEMI 10/12: LHC 04/02/11: mLAD 95%, EF 60%; PCI: Promus DES to mLAD. b. LHC 04/29/11:  LAD stent patent, proximal RCA 25%, EF 65%   . NSTEMI (non-ST elevated myocardial infarction) ([place]) 2012  . Multinodular thyroid   . Lung nodule     a. Pt initially refused further workup. b. Per pulm note, resolved 02/19/14 and never smoker so no further f/u needed.  [name] History of noncompliance with medical treatment   . HBV (hepatitis B virus) infection   . Carotid stenosis     a. 40-59% bilateral ICA stenosis, repeat 1 year.  [name] PVC's (premature ventricular contractions)    Past Surgical History  Procedure Laterality Date  . Cesarean section    . Tonsillectomy    . Hemorroidectomy      1999  . Coronary stent placement  2012   Family History  Problem Relation Age of Onset  . Prostate cancer Father   . Cancer Father 69    bladder cancer  . Alcohol abuse Father   . Diabetes Sister   . Diabetes Sister   . Heart attack Brother   . Stroke Neg Hx    Social History  Substance Use Topics  . Smoking  status: Never Smoker   . Smokeless tobacco: Never Used  . Alcohol Use: No   OB History    No data available     Review of Systems  Constitutional:       Per HPI, otherwise negative  HENT:       Per HPI, otherwise negative  Respiratory:       Per HPI, otherwise negative  Cardiovascular:       Per HPI, otherwise negative  Gastrointestinal: Negative for vomiting.  Endocrine:       Negative aside from HPI  Genitourinary:       Neg aside from HPI   Musculoskeletal:       Per HPI, otherwise negative  Skin: Negative.   Neurological: Negative for syncope.      Allergies  Atorvastatin; Crestor; Ibuprofen; and Pravastatin sodium  Home Medications   Prior to Admission medications   Medication Sig Start Date End Date Taking? Authorizing Provider  aspirin EC 81 MG tablet Take 1 tablet (81 mg total) by mouth every other day. 04/17/14   Larey Dresser, MD  lisinopril (PRINIVIL,ZESTRIL) 2.5 MG tablet Take 1 tablet (2.5 mg total) by mouth daily. 06/23/14   Roselee Culver, MD  nitroGLYCERIN (NITROSTAT) 0.4 MG SL tablet Place  1 tablet (0.4 mg total) under the tongue every 5 (five) minutes as needed for chest pain. 01/16/15   Larey Dresser, MD   BP 136/66 mmHg  Pulse 62  Temp(Src) 97.5 F (36.4 C) (Oral)  Resp 18  Ht 4\' 10"  (1.473 m)  Wt 105 lb (47.628 kg)  BMI 21.95 kg/m2  SpO2 100% Physical Exam  Constitutional: She is oriented to person, place, and time. She appears well-developed and well-nourished. No distress.  HENT:  Head: Normocephalic and atraumatic.  Eyes: Conjunctivae and EOM are normal.  Cardiovascular: Normal rate and regular rhythm.   Pulmonary/Chest: Effort normal and breath sounds normal. No stridor. No respiratory distress.  Abdominal: She exhibits no distension.  Musculoskeletal: She exhibits no edema.  Neurological: She is alert and oriented to person, place, and time. No cranial nerve deficit.  Skin: Skin is warm and dry.  Psychiatric: She has a normal  mood and affect.  Nursing note and vitals reviewed.   ED Course  Procedures (including critical care time) Labs Reviewed  Initial troponin normal  Imaging Review Dg Chest 2 View  11/28/2015  CLINICAL DATA:  Acute upper back pain. EXAM: CHEST  2 VIEW COMPARISON:  Radiograph of February 19, 2014. FINDINGS: The heart size and mediastinal contours are within normal limits. Both lungs are clear. No pneumothorax or pleural effusion is noted. The visualized skeletal structures are unremarkable. IMPRESSION: No active cardiopulmonary disease. Electronically Signed   By: Marijo Conception, M.D.   On: 11/28/2015 09:21   I have personally reviewed and evaluated these images and lab results as part of my medical decision-making.  EKG with sinus rhythm, rate 63, T-wave inversions lateral, inferior, changed from October/2016   EKG #2 1 hour after arrival with similar T-wave inversions, rate 68, abnormal  10:27 AM Repeat troponin 0.5. Heparin starting.   MDM  Elderly female with history of prior MI, stent, no current antiplatelet or anticoagulant use presents with new chest pain, back pain. Patient is arriving almost 1 hour after the onset of symptoms, and though the initial troponin was normal, with T-wave changes compared to prior EKG, there is concern for ongoing ischemia. Pain well-controlled with aspirin, nitroglycerin. Second troponin positive, and with persistent T-wave inversions, the patient was started on heparin drip, admitted to our cardiology service.   CRITICAL CARE Performed by: Carmin Muskrat Total critical care time: 40 minutes Critical care time was exclusive of separately billable procedures and treating other patients. Critical care was necessary to treat or prevent imminent or life-threatening deterioration. Critical care was time spent personally by me on the following activities: development of treatment plan with patient and/or surrogate as well as nursing, discussions  with consultants, evaluation of patient's response to treatment, examination of patient, obtaining history from patient or surrogate, ordering and performing treatments and interventions, ordering and review of laboratory studies, ordering and review of radiographic studies, pulse oximetry and re-evaluation of patient's condition.   Carmin Muskrat, MD 11/28/15 1028

## 2015-11-28 NOTE — Progress Notes (Signed)
ANTICOAGULATION CONSULT NOTE - Initial Consult  Pharmacy Consult for Heparin Indication: chest pain/ACS  Allergies  Allergen Reactions  . Atorvastatin Other (See Comments)    Muscle pain  . Crestor [Rosuvastatin] Other (See Comments)    Muscle pain  . Ibuprofen     REACTION: rapid heart rate  . Pravastatin Sodium Other (See Comments)    Muscle cramps/ confusion   Patient Measurements: Height: 4\' 10"  (147.3 cm) Weight: 105 lb (47.628 kg) IBW/kg (Calculated) : 40.9 Heparin Dosing Weight: 47kg  Vital Signs: Temp: 97.5 F (36.4 C) (06/17 0735) Temp Source: Oral (06/17 0735) BP: 145/80 mmHg (06/17 1000) Pulse Rate: 65 (06/17 1000)  Labs:  Recent Labs  11/28/15 0750 11/28/15 0943  HGB 12.6  --   HCT 38.8  --   PLT 392  --   CREATININE 0.85  --   TROPONINI <0.03 0.50*   Estimated Creatinine Clearance: 40.9 mL/min (by C-G formula based on Cr of 0.85).  Medical History: Past Medical History  Diagnosis Date  . GERD (gastroesophageal reflux disease)   . Hiatal hernia   . Arthritis   . HTN (hypertension)   . HLD (hyperlipidemia)   . CAD (coronary artery disease)     a. s/p NSTEMI 10/12: LHC 04/02/11: mLAD 95%, EF 60%; PCI: Promus DES to mLAD. b. LHC 04/29/11:  LAD stent patent, proximal RCA 25%, EF 65%   . NSTEMI (non-ST elevated myocardial infarction) ([place]) 2012  . Multinodular thyroid   . Lung nodule     a. Pt initially refused further workup. b. Per pulm note, resolved 02/19/14 and never smoker so no further f/u needed.  [name] History of noncompliance with medical treatment   . HBV (hepatitis B virus) infection   . Carotid stenosis     a. 40-59% bilateral ICA stenosis, repeat 1 year.  [name] PVC's (premature ventricular contractions)    Assessment: 70yo female admitted with complaints of chest pain.  Found to have elevated troponin and we have been asked to start her on some IV heparin.  She has a H/H and platelets within normal ranges and has no noted hstory of bleeding.   Per patient, she is not allergic to IV heparin, she confirmed her height and weight as documented in [facility].  Goal of Therapy:  Heparin level 0.3-0.7 units/ml Monitor platelets by anticoagulation protocol: Yes   Plan:  Heparin 2000 units IV bolus Heparin infusion at 750 units/hr  Will check heparin level in 6 hours to ensure adequate response Daily heparin levels and CBC  Rober Minion, PharmD., MS Clinical Pharmacist Pager:  [phone] Thank you for allowing pharmacy to be part of this patients care team. 11/28/2015,10:36 AM

## 2015-11-28 NOTE — H&P (Signed)
CARDIOLOGY CONSULT NOTE   Patient ID: [name] MRN: [id number] DOB/AGE: [birth year] 69 y.o.  Admit date: 11/28/2015  Primary Physician   HOPPER,DAVID, MD Primary Cardiologist   Dr Aundra Dubin Reason for Consultation   CP, elevated troponin  Requesting MD: Dr Vanita Panda  [id number] [name] is a 70 y.o. year old female with a history of NSTEMI 2012 w/ DES LAD, EF nl, carotid stenosis, HTN, HLD, GERD, PAC/PVCs. She has refused multiple medications in the past due to SEs/intolerances (beta blockers, statins, ASA, Diltiazem, Losartan).   Pt was in [place] yesterday. She ate ice cream in the afternoon. Today she had onset of back pain and heartburn about 6:30 am, while she was walking around the garden. Back pain was a 6-7/10, and the heartburn was a 3/10. The symptoms were different from her MI symptoms but she was concerned that it was her heart.   She came to the ER and was given 1 SL NTG and 2 baby ASA (she stopped ASA at home 2 years ago after a pimple bled and a nosebleed) and SL NTG x 1. Her back pain and heartburn are gone.    Past Medical History  Diagnosis Date  . GERD (gastroesophageal reflux disease)   . Hiatal hernia   . Arthritis   . HTN (hypertension)   . HLD (hyperlipidemia)   . CAD (coronary artery disease)     a. s/p NSTEMI 10/12: LHC 04/02/11: mLAD 95%, EF 60%; PCI: Promus DES to mLAD. b. LHC 04/29/11:  LAD stent patent, proximal RCA 25%, EF 65%   . NSTEMI (non-ST elevated myocardial infarction) ([name]) 2012  . Multinodular thyroid   . Lung nodule     a. Pt initially refused further workup. b. Per pulm note, resolved 02/19/14 and never smoker so no further f/u needed.  [name] History of noncompliance with medical treatment   . HBV (hepatitis B virus) infection   . Carotid stenosis 2015    a. 40-59% bilateral ICA stenosis, repeat 1 year.  [name] PVC's (premature ventricular contractions)      Past Surgical History  Procedure Laterality Date  . Cesarean section     . Tonsillectomy    . Hemorroidectomy      1999  . Coronary stent placement  2012    Allergies  Allergen Reactions  . Atorvastatin Other (See Comments)    Muscle pain  . Crestor [Rosuvastatin] Other (See Comments)    Muscle pain  . Ibuprofen     REACTION: rapid heart rate  . Pravastatin Sodium Other (See Comments)    Muscle cramps/ confusion    I have reviewed the patient's current medications   . heparin 750 Units/hr (11/28/15 1103)   nitroGLYCERIN  Prior to Admission medications   Medication Sig Start Date End Date Taking? Authorizing Provider  nitroGLYCERIN (NITROSTAT) 0.4 MG SL tablet Place 1 tablet (0.4 mg total) under the tongue every 5 (five) minutes as needed for chest pain. 01/16/15  Yes Larey Dresser, MD  aspirin EC 81 MG tablet Take 1 tablet (81 mg total) by mouth every other day. 04/17/14   Larey Dresser, MD  lisinopril (PRINIVIL,ZESTRIL) 2.5 MG tablet Take 1 tablet (2.5 mg total) by mouth daily. 06/23/14   Roselee Culver, MD     Social History   Social History  . Marital Status: Married    Spouse Name: N/A  . Number of Children: 1  . Years of Education: N/A   Occupational History  .  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   Social History Main Topics  . Smoking status: Never Smoker   . Smokeless tobacco: Never Used  . Alcohol Use: No  . Drug Use: No  . Sexual Activity: Yes    Birth Control/ Protection: Post-menopausal   Other Topics Concern  . Not on file   Social History Narrative   Marital status: married x 31 years.  From [place]; to [place] 1984.      Children:  1 child (30); no grandchildren.      Lives: with husband.      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      Tobacco: never      Alcohol: none       Exercise: works in the garden.     Family Status  Relation Status Death Age  . Father Deceased 4    cancer bladder  . Sister Alive   . Mother Deceased 44    heart related  . Sister Deceased 37    diabetes  .  Sister Alive    Family History  Problem Relation Age of Onset  . Prostate cancer Father   . Cancer Father 22    bladder cancer  . Alcohol abuse Father   . Diabetes Sister   . Diabetes Sister   . Heart attack Brother   . Stroke Neg Hx      ROS:  Full 14 point review of systems complete and found to be negative unless listed above.  Physical Exam: Blood pressure 147/65, pulse 72, temperature 97.5 F (36.4 C), temperature source Oral, resp. rate 15, height 4\' 10"  (1.473 m), weight 105 lb (47.628 kg), SpO2 100 %.  General: Well developed, well nourished, female in no acute distress Head: Eyes PERRLA, No xanthomas.   Normocephalic and atraumatic, oropharynx without edema or exudate. Dentition: good Lungs: clear bilaterally Heart: HRRR S1 S2, no rub/gallop, no murmur. pulses are 2+ all 4 extrem.   Neck: No carotid bruits. No lymphadenopathy.  JVD not elevated Abdomen: Bowel sounds present, abdomen soft and non-tender without masses or hernias noted. Msk:  No spine or cva tenderness. No weakness, no joint deformities or effusions. Extremities: No clubbing or cyanosis. No edema.  Neuro: Alert and oriented X 3. No focal deficits noted. Psych:  Good affect, responds appropriately Skin: No rashes or lesions noted.  Labs:   Lab Results  Component Value Date   WBC 5.6 11/28/2015   HGB 12.6 11/28/2015   HCT 38.8 11/28/2015   MCV 91.5 11/28/2015   PLT 392 11/28/2015     Recent Labs Lab 11/28/15 0750  NA 137  K 3.6  CL 105  CO2 23  BUN 12  CREATININE 0.85  CALCIUM 8.6*  GLUCOSE 149*   No results found for: MG  Recent Labs  11/28/15 0750 11/28/15 0943  TROPONINI <0.03 0.50*    Recent Labs  11/28/15 0757  TROPIPOC 0.01   Lab Results  Component Value Date   CHOL 207* 12/05/2013   HDL 70 12/05/2013   LDLCALC 117* 12/05/2013   TRIG 98 12/05/2013   TSH  Date/Time Value Ref Range Status  10/23/2014 09:48 AM 1.241 0.350 - 4.500 uIU/mL Final   T4, TOTAL    Date/Time Value Ref Range Status  10/23/2014 09:48 AM 8.8 4.5 - 12.0 ug/dL Final   Echo: 2012 - Left ventricle: The cavity size was normal. Wall thickness was normal. Systolic function was normal. The estimated ejection fraction was in  the range of 55% to 65%. Wall motion was normal; there were no regional wall motion abnormalities. Left ventricular diastolic function parameters were normal. - Mitral valve: Mild regurgitation. - Pulmonary arteries: Systolic pressure was mildly increased. PA peak pressure: 60mm Hg (S).  ECG:  11/28/2015 SR, HR 63 Inferolateral ST/Twave changes are different from 03/2015  Cath:  03/2011 Left main coronary artery was normal. Left anterior descending artery had a 95% discrete stenosis in the mid vessel. This was after the first septal perforator and diagonal branch. There was normal TIMI flow. The mid and distal LAD were normal. First diagonal branch was a medium-sized vessel and was normal. Circumflex coronary artery was nondominant. There was a small first obtuse marginal branch, a small second obtuse marginal branch. Both were normal. The proximal and mid circumflex were normal. The third obtuse marginal branch was large and normal. There was a small AV groove branch. Right coronary artery was dominant. It was normal. There were no right- to-left collaterals. RAO ventriculography. RAO ventriculography was normal. EF was 60%. There was no gradient across the aortic valve and no MR. Aortic pressure was 130/61. LV pressure was 130/1 with a post A-wave EDP of 3. IMPRESSION: Films were reviewed with Dr. Burt Knack. She will proceed with stenting of the mid LAD. The patient tolerated the diagnostic procedure Well. (pt had 3.5 x 20 mm PROMUS Element DES)   Radiology:  Dg Chest 2 View 11/28/2015  CLINICAL DATA:  Acute upper back pain. EXAM: CHEST  2 VIEW COMPARISON:  Radiograph of February 19, 2014. FINDINGS: The heart size  and mediastinal contours are within normal limits. Both lungs are clear. No pneumothorax or pleural effusion is noted. The visualized skeletal structures are unremarkable. IMPRESSION: No active cardiopulmonary disease. Electronically Signed   By: Marijo Conception, M.D.   On: 11/28/2015 09:21    ASSESSMENT AND PLAN:   The patient was seen today by Dr Wynonia Lawman, the patient evaluated and the data reviewed.  Active Problems:   NSTEMI (non-ST elevated myocardial infarction) (HCC) - Admit, cycle ez, add NTG paste - restart ASA, add Coreg (if pt will take) - Cath Monday  Signed: Rosaria Ferries, PA-C 11/28/2015 1:12 PM Beeper [id number]  Co-Sign MD

## 2015-11-28 NOTE — Progress Notes (Signed)
ANTICOAGULATION CONSULT NOTE - FOLLOW UP    HL = 0.88 (goal 0.3 - 0.7 units/mL) Heparin dosing weight = 47 kg   Assessment: 69 YOF continues on IV heparin for ACS.  Heparin level is supra-therapeutic; no bleeding reported.  Verified that lab was drawn appropriately.   Plan: - Reduce heparin gtt to 600 units/hr - Check 6 hr HL   Ellijah Leffel D. Mina Marble, PharmD, BCPS 11/28/2015, 7:23 PM

## 2015-11-28 NOTE — ED Notes (Addendum)
Patient complains of chest discomfort and back pain since 0630 while she was working in the garden. Has had MI in past. Denies any other associated symptoms. Describes the chest pain as an indigestion feeling.

## 2015-11-28 NOTE — ED Notes (Signed)
CRITICAL VALUE ALERT  Critical value received:  Trop 0.50  Date of notification:  11/28/15  Time of notification:  1026  Critical value read back: yes  MD notified: Vanita Panda, MD

## 2015-11-29 ENCOUNTER — Other Ambulatory Visit (HOSPITAL_COMMUNITY): Payer: Medicare Other

## 2015-11-29 ENCOUNTER — Other Ambulatory Visit: Payer: Self-pay

## 2015-11-29 DIAGNOSIS — I214 Non-ST elevation (NSTEMI) myocardial infarction: Principal | ICD-10-CM

## 2015-11-29 LAB — COMPREHENSIVE METABOLIC PANEL
ALT: 27 U/L (ref 14–54)
AST: 91 U/L — ABNORMAL HIGH (ref 15–41)
Albumin: 3.5 g/dL (ref 3.5–5.0)
Alkaline Phosphatase: 47 U/L (ref 38–126)
Anion gap: 7 (ref 5–15)
BUN: 12 mg/dL (ref 6–20)
CHLORIDE: 107 mmol/L (ref 101–111)
CO2: 24 mmol/L (ref 22–32)
CREATININE: 0.8 mg/dL (ref 0.44–1.00)
Calcium: 8.6 mg/dL — ABNORMAL LOW (ref 8.9–10.3)
GFR calc non Af Amer: >60 mL/min (ref >60)
Glucose, Bld: 109 mg/dL — ABNORMAL HIGH (ref 65–99)
POTASSIUM: 3.5 mmol/L (ref 3.5–5.1)
SODIUM: 138 mmol/L (ref 135–145)
Total Bilirubin: 0.8 mg/dL (ref 0.3–1.2)
Total Protein: 6.4 g/dL — ABNORMAL LOW (ref 6.5–8.1)

## 2015-11-29 LAB — LIPID PANEL
CHOL/HDL RATIO: 4.1 ratio
CHOLESTEROL: 229 mg/dL — AB (ref 0–200)
HDL: 56 mg/dL (ref >40)
LDL Cholesterol: 154 mg/dL — ABNORMAL HIGH (ref 0–99)
TRIGLYCERIDES: 96 mg/dL (ref <150)
VLDL: 19 mg/dL (ref 0–40)

## 2015-11-29 LAB — TROPONIN I: TROPONIN I: 11.81 ng/mL — AB (ref <0.031)

## 2015-11-29 LAB — CBC
HCT: 37 % (ref 36.0–46.0)
Hemoglobin: 11.8 g/dL — ABNORMAL LOW (ref 12.0–15.0)
MCH: 29.1 pg (ref 26.0–34.0)
MCHC: 31.9 g/dL (ref 30.0–36.0)
MCV: 91.1 fL (ref 78.0–100.0)
PLATELETS: 416 10*3/uL — AB (ref 150–400)
RBC: 4.06 MIL/uL (ref 3.87–5.11)
RDW: 12.5 % (ref 11.5–15.5)
WBC: 7.5 10*3/uL (ref 4.0–10.5)

## 2015-11-29 LAB — HEPARIN LEVEL (UNFRACTIONATED)
HEPARIN UNFRACTIONATED: 0.64 [IU]/mL (ref 0.30–0.70)
HEPARIN UNFRACTIONATED: 0.4 [IU]/mL (ref 0.30–0.70)
Heparin Unfractionated: 0.78 IU/mL — ABNORMAL HIGH (ref 0.30–0.70)

## 2015-11-29 MED ORDER — SODIUM CHLORIDE 0.9 % WEIGHT BASED INFUSION
3.0000 mL/kg/h | INTRAVENOUS | Status: DC
  Administered 2015-11-30: 3 mL/kg/h via INTRAVENOUS

## 2015-11-29 MED ORDER — ASPIRIN EC 81 MG PO TBEC
81.0000 mg | DELAYED_RELEASE_TABLET | ORAL | Status: DC
  Filled 2015-11-29: qty 1

## 2015-11-29 MED ORDER — ASPIRIN 81 MG PO CHEW
324.0000 mg | CHEWABLE_TABLET | ORAL | Status: AC
  Administered 2015-11-30: 324 mg via ORAL
  Filled 2015-11-29: qty 4

## 2015-11-29 MED ORDER — SODIUM CHLORIDE 0.9 % IV SOLN: 250.0000 mL | INTRAVENOUS | Status: DC | PRN

## 2015-11-29 MED ORDER — SODIUM CHLORIDE 0.9 % WEIGHT BASED INFUSION: 1.0000 mL/kg/h | INTRAVENOUS | Status: DC

## 2015-11-29 NOTE — Progress Notes (Signed)
Utilization review completed.  

## 2015-11-29 NOTE — Progress Notes (Signed)
ANTICOAGULATION CONSULT NOTE - Follow Up Consult  Pharmacy Consult for heparin Indication: NSTEMI   Labs:  Recent Labs  11/28/15 0750  11/28/15 1528 11/28/15 1820 11/28/15 2008 11/29/15 0207  HGB 12.6  --   --   --   --  11.8*  HCT 38.8  --   --   --   --  37.0  PLT 392  --   --   --   --  416*  LABPROT  --   --  14.5  --   --   --   INR  --   --  1.11  --   --   --   HEPARINUNFRC  --   --   --  0.88*  --  0.78*  CREATININE 0.85  --   --   --   --  0.80  TROPONINI <0.03  < > 30.89*  --  19.55* 11.81*  < > = values in this interval not displayed.   Assessment: 70yo female remains above goal on heparin after rate change.  Goal of Therapy:  Heparin level 0.3-0.7 units/ml   Plan:  Will decrease heparin gtt further to 500 units/hr and check level in [place], PharmD, BCPS  11/29/2015,3:01 AM

## 2015-11-29 NOTE — Progress Notes (Signed)
ANTICOAGULATION CONSULT NOTE - Follow Up Consult  Pharmacy Consult for heparin Indication: NSTEMI   Labs:  Recent Labs  11/28/15 0750  11/28/15 1528  11/28/15 2008 11/29/15 0207 11/29/15 1033 11/29/15 2016  HGB 12.6  --   --   --   --  11.8*  --   --   HCT 38.8  --   --   --   --  37.0  --   --   PLT 392  --   --   --   --  416*  --   --   LABPROT  --   --  14.5  --   --   --   --   --   INR  --   --  1.11  --   --   --   --   --   HEPARINUNFRC  --   --   --   < >  --  0.78* 0.64 0.40  CREATININE 0.85  --   --   --   --  0.80  --   --   TROPONINI <0.03  < > 30.89*  --  19.55* 11.81*  --   --   < > = values in this interval not displayed.   Assessment: 70yo female remains above goal on heparin after rate change.  Repeat HL remains therapeutic at 0.4 on heparin 500 units/hr. No issues with infusion or bleeding noted.  Goal of Therapy:  Heparin level 0.3-0.7 units/ml   Plan:  Continue heparin 500 units/hr Daily HL/CBC Monitor s/sx of bleeding  [name]. [name], PharmD, [place] Clinical Pharmacist Pager [phone]  11/29/2015,9:22 PM

## 2015-11-29 NOTE — Progress Notes (Signed)
Patient Name: [name] Date of Encounter: 11/29/2015  Principal Problem:   NSTEMI (non-ST elevated myocardial infarction) [facility]) Active Problems:   Hyperlipidemia   Hypertension   Primary Cardiologist: Dr Aundra Dubin  Patient Profile: 70 y.o. year old female with a history of NSTEMI 2012 w/ DES LAD, EF nl, carotid stenosis, HTN, HLD, GERD, PAC/PVCs. She has refused multiple medications in the past due to SEs/intolerances (beta blockers, statins, ASA, Diltiazem, Losartan). Admitted 06/17 w/ NSTEMI  SUBJECTIVE: No more chest pain, still refuses statin, BP low on the NTG paste. Got a HA from it.  OBJECTIVE Filed Vitals:   11/28/15 1435 11/28/15 1718 11/28/15 2026 11/29/15 0520  BP: 150/61 161/68 129/54 98/47  Pulse: 72 78 69 69  Temp: 97.9 F (36.6 C)  98.4 F (36.9 C) 98.3 F (36.8 C)  TempSrc: Oral  Oral Oral  Resp: 16  18 18   Height: 4\' 10"  (1.473 m)     Weight: 105 lb 4.8 oz (47.764 kg)   103 lb 9.9 oz (47 kg)  SpO2: 100%  99% 98%    Intake/Output Summary (Last 24 hours) at 11/29/15 0918 Last data filed at 11/29/15 0657  Gross per 24 hour  Intake 564.68 ml  Output    500 ml  Net  64.68 ml   Filed Weights   11/28/15 0735 11/28/15 1435 11/29/15 0520  Weight: 105 lb (47.628 kg) 105 lb 4.8 oz (47.764 kg) 103 lb 9.9 oz (47 kg)    PHYSICAL EXAM General: Well developed, well nourished, female in no acute distress. Head: Normocephalic, atraumatic.  Neck: Supple without bruits, JVD not elevated. Lungs:  Resp regular and unlabored, CTA. Heart: RRR, S1, S2, no S3, S4, or murmur; no rub. Abdomen: Soft, non-tender, non-distended, BS + x 4.  Extremities: No clubbing, cyanosis, edema.  Neuro: Alert and oriented X 3. Moves all extremities spontaneously. Psych: Normal affect.  LABS: CBC:  Recent Labs  11/28/15 0750 11/29/15 0207  WBC 5.6 7.5  HGB 12.6 11.8*  HCT 38.8 37.0  MCV 91.5 91.1  PLT 392 416*   INR:  Recent Labs  11/28/15 1528  INR 0000000    Basic Metabolic Panel:  Recent Labs  11/28/15 0750 11/29/15 0207  NA 137 138  K 3.6 3.5  CL 105 107  CO2 23 24  GLUCOSE 149* 109*  BUN 12 12  CREATININE 0.85 0.80  CALCIUM 8.6* 8.6*   Liver Function Tests:  Recent Labs  11/29/15 0207  AST 91*  ALT 27  ALKPHOS 47  BILITOT 0.8  PROT 6.4*  ALBUMIN 3.5   Cardiac Enzymes:  Recent Labs  11/28/15 1528 11/28/15 2008 11/29/15 0207  TROPONINI 30.89* 19.55* 11.81*    Recent Labs  11/28/15 0757  TROPIPOC 0.01   Fasting Lipid Panel:  Recent Labs  11/29/15 0207  CHOL 229*  HDL 56  LDLCALC 154*  TRIG 96  CHOLHDL 4.1   Thyroid Function Tests:  Recent Labs  11/28/15 1520  TSH 2.029   TELE: SR, S brady 50s     ECG: 06/18 SR, Lateral T wave changes have improved from 06/17  Radiology/Studies: Dg Chest 2 View  11/28/2015  CLINICAL DATA:  Acute upper back pain. EXAM: CHEST  2 VIEW COMPARISON:  Radiograph of February 19, 2014. FINDINGS: The heart size and mediastinal contours are within normal limits. Both lungs are clear. No pneumothorax or pleural effusion is noted. The visualized skeletal structures are unremarkable. IMPRESSION: No active cardiopulmonary disease. Electronically Signed  By: Marijo Conception, M.D.   On: 11/28/2015 09:21     Current Medications:  . [START ON 11/30/2015] aspirin  324 mg Oral Pre-Cath  . aspirin EC  81 mg Oral QODAY  . carvedilol  3.125 mg Oral BID WC  . nitroGLYCERIN  1 inch Topical Q6H  . sodium chloride flush  3 mL Intravenous Q12H  . sodium chloride flush  3 mL Intravenous Q12H   . [START ON 11/30/2015] sodium chloride     Followed by  . [START ON 11/30/2015] sodium chloride    . heparin 500 Units/hr (11/29/15 0320)    ASSESSMENT AND PLAN: Principal Problem:   NSTEMI (non-ST elevated myocardial infarction) (HCC) - continue ASA 81 mg, heparin, decrease nitro to 0.5" q 6 hr - cath in am, orders written.  Active Problems:   Hyperlipidemia - pt still refuses  statin, even low dose intermittent - wants to know other options, will have nutritionist see    Hypertension - BP on the low side due to meds, asymptomatic - hold Lisinopril - continue Coreg and nitro past at low doses if BP will tolerate  Signed, Rosaria Ferries , PA-C 9:18 AM 11/29/2015  Seen by Dr Wynonia Lawman yesterday Significant SSCP.  Troponin over 30.  T wave inversion I,AVL NO ST elevation She is pain free on heparin For cath in am or sooner if pain returns. Exam with no murmur and clear lungs Good right radial pulse  Jenkins Rouge

## 2015-11-30 ENCOUNTER — Other Ambulatory Visit: Payer: Self-pay

## 2015-11-30 ENCOUNTER — Encounter (HOSPITAL_COMMUNITY)
Admission: EM | Disposition: A | Discharge status: Home / Self Care | Payer: Self-pay | Source: Home / Self Care | Attending: Internal Medicine

## 2015-11-30 ENCOUNTER — Encounter (HOSPITAL_COMMUNITY): Payer: Self-pay | Admitting: General Practice

## 2015-11-30 ENCOUNTER — Inpatient Hospital Stay (HOSPITAL_COMMUNITY): Payer: Medicare Other

## 2015-11-30 ENCOUNTER — Other Ambulatory Visit (HOSPITAL_COMMUNITY): Payer: Medicare Other

## 2015-11-30 DIAGNOSIS — E785 Hyperlipidemia, unspecified: Secondary | ICD-10-CM

## 2015-11-30 DIAGNOSIS — I2510 Atherosclerotic heart disease of native coronary artery without angina pectoris: Secondary | ICD-10-CM

## 2015-11-30 DIAGNOSIS — I10 Essential (primary) hypertension: Secondary | ICD-10-CM

## 2015-11-30 DIAGNOSIS — R079 Chest pain, unspecified: Secondary | ICD-10-CM

## 2015-11-30 HISTORY — PX: CARDIAC CATHETERIZATION: SHX172

## 2015-11-30 LAB — CBC
HCT: 39 % (ref 36.0–46.0)
Hemoglobin: 12.2 g/dL (ref 12.0–15.0)
MCH: 29.4 pg (ref 26.0–34.0)
MCHC: 31.3 g/dL (ref 30.0–36.0)
MCV: 94 fL (ref 78.0–100.0)
PLATELETS: 401 10*3/uL — AB (ref 150–400)
RBC: 4.15 MIL/uL (ref 3.87–5.11)
RDW: 12.7 % (ref 11.5–15.5)
WBC: 7 10*3/uL (ref 4.0–10.5)

## 2015-11-30 LAB — POCT ACTIVATED CLOTTING TIME: Activated Clotting Time: 384 seconds

## 2015-11-30 LAB — HEMOGLOBIN A1C
Hgb A1c MFr Bld: 5.5 % (ref 4.8–5.6)
Mean Plasma Glucose: 111 mg/dL

## 2015-11-30 LAB — HEPARIN LEVEL (UNFRACTIONATED): Heparin Unfractionated: 0.4 IU/mL (ref 0.30–0.70)

## 2015-11-30 SURGERY — LEFT HEART CATH AND CORONARY ANGIOGRAPHY
Anesthesia: LOCAL

## 2015-11-30 MED ORDER — HEPARIN (PORCINE) IN NACL 2-0.9 UNIT/ML-% IJ SOLN
INTRAMUSCULAR | Status: DC | PRN
  Administered 2015-11-30: 1000 mL

## 2015-11-30 MED ORDER — LIDOCAINE HCL (PF) 1 % IJ SOLN
INTRAMUSCULAR | Status: DC | PRN
  Administered 2015-11-30: 2 mL

## 2015-11-30 MED ORDER — NITROGLYCERIN 1 MG/10 ML FOR IR/CATH LAB
INTRA_ARTERIAL | Status: DC | PRN
  Administered 2015-11-30: 200 ug via INTRACORONARY

## 2015-11-30 MED ORDER — HEPARIN (PORCINE) IN NACL 2-0.9 UNIT/ML-% IJ SOLN
INTRAMUSCULAR | Status: AC
  Filled 2015-11-30: qty 500

## 2015-11-30 MED ORDER — SODIUM CHLORIDE 0.9 % IV SOLN: 250.0000 mL | INTRAVENOUS | Status: DC | PRN

## 2015-11-30 MED ORDER — FENTANYL CITRATE (PF) 100 MCG/2ML IJ SOLN
INTRAMUSCULAR | Status: DC | PRN
  Administered 2015-11-30 (×2): 25 ug via INTRAVENOUS

## 2015-11-30 MED ORDER — IOPAMIDOL (ISOVUE-370) INJECTION 76%
INTRAVENOUS | Status: DC | PRN
  Administered 2015-11-30: 130 mL via INTRAVENOUS

## 2015-11-30 MED ORDER — SODIUM CHLORIDE 0.9% FLUSH
3.0000 mL | Freq: Two times a day (BID) | INTRAVENOUS | Status: DC
  Administered 2015-11-30: 19:00:00 3 mL via INTRAVENOUS

## 2015-11-30 MED ORDER — MIDAZOLAM HCL 2 MG/2ML IJ SOLN
INTRAMUSCULAR | Status: DC | PRN
  Administered 2015-11-30 (×2): 1 mg via INTRAVENOUS

## 2015-11-30 MED ORDER — HEART ATTACK BOUNCING BOOK
Freq: Once | Status: AC
  Administered 2015-11-30: 20:00:00
  Filled 2015-11-30: qty 1

## 2015-11-30 MED ORDER — IOPAMIDOL (ISOVUE-370) INJECTION 76%
INTRAVENOUS | Status: AC
  Filled 2015-11-30: qty 100

## 2015-11-30 MED ORDER — SODIUM CHLORIDE 0.9 % IV SOLN: INTRAVENOUS | Status: AC

## 2015-11-30 MED ORDER — NITROGLYCERIN 1 MG/10 ML FOR IR/CATH LAB
INTRA_ARTERIAL | Status: AC
  Filled 2015-11-30: qty 10

## 2015-11-30 MED ORDER — HEPARIN SODIUM (PORCINE) 1000 UNIT/ML IJ SOLN
INTRAMUSCULAR | Status: DC | PRN
  Administered 2015-11-30: 5000 [IU] via INTRAVENOUS
  Administered 2015-11-30: 3000 [IU] via INTRAVENOUS

## 2015-11-30 MED ORDER — MIDAZOLAM HCL 2 MG/2ML IJ SOLN
INTRAMUSCULAR | Status: AC
  Filled 2015-11-30: qty 2

## 2015-11-30 MED ORDER — ANGIOPLASTY BOOK
Freq: Once | Status: AC
  Administered 2015-11-30: 20:00:00
  Filled 2015-11-30: qty 1

## 2015-11-30 MED ORDER — LIDOCAINE HCL (PF) 1 % IJ SOLN
INTRAMUSCULAR | Status: AC
  Filled 2015-11-30: qty 30

## 2015-11-30 MED ORDER — CLOPIDOGREL BISULFATE 300 MG PO TABS
ORAL_TABLET | ORAL | Status: DC | PRN
  Administered 2015-11-30: 600 mg via ORAL

## 2015-11-30 MED ORDER — SODIUM CHLORIDE 0.9% FLUSH: 3.0000 mL | INTRAVENOUS | Status: DC | PRN

## 2015-11-30 MED ORDER — CLOPIDOGREL BISULFATE 300 MG PO TABS
ORAL_TABLET | ORAL | Status: AC
  Filled 2015-11-30: qty 2

## 2015-11-30 MED ORDER — HEPARIN (PORCINE) IN NACL 2-0.9 UNIT/ML-% IJ SOLN
INTRAMUSCULAR | Status: AC
  Filled 2015-11-30: qty 1000

## 2015-11-30 MED ORDER — HEPARIN (PORCINE) IN NACL 2-0.9 UNIT/ML-% IJ SOLN
INTRAMUSCULAR | Status: DC | PRN
  Administered 2015-11-30: 13:00:00 via INTRA_ARTERIAL

## 2015-11-30 MED ORDER — FENTANYL CITRATE (PF) 100 MCG/2ML IJ SOLN
INTRAMUSCULAR | Status: AC
  Filled 2015-11-30: qty 2

## 2015-11-30 MED ORDER — CLOPIDOGREL BISULFATE 75 MG PO TABS
75.0000 mg | ORAL_TABLET | Freq: Every day | ORAL | Status: DC
  Administered 2015-12-01: 75 mg via ORAL
  Filled 2015-11-30: qty 1

## 2015-11-30 MED ORDER — VERAPAMIL HCL 2.5 MG/ML IV SOLN
INTRAVENOUS | Status: AC
  Filled 2015-11-30: qty 2

## 2015-11-30 MED ORDER — IOPAMIDOL (ISOVUE-370) INJECTION 76%
INTRAVENOUS | Status: AC
  Filled 2015-11-30: qty 50

## 2015-11-30 SURGICAL SUPPLY — 21 items
BALLN EUPHORA RX 2.25X12 (BALLOONS) ×2
BALLN ~~LOC~~ EUPHORA RX 3.25X15 (BALLOONS) ×2
BALLOON EUPHORA RX 2.25X12 (BALLOONS) ×1 IMPLANT
BALLOON ~~LOC~~ EUPHORA RX 3.25X15 (BALLOONS) ×1 IMPLANT
CATH INFINITI 5 FR JL3.5 (CATHETERS) ×2 IMPLANT
CATH INFINITI 5FR ANG PIGTAIL (CATHETERS) ×2 IMPLANT
CATH INFINITI JR4 5F (CATHETERS) ×2 IMPLANT
CATH VISTA GUIDE 6FR XBLAD3.0 (CATHETERS) ×2 IMPLANT
DEVICE RAD COMP TR BAND LRG (VASCULAR PRODUCTS) ×2 IMPLANT
GLIDESHEATH SLEND SS 6F .021 (SHEATH) ×2 IMPLANT
KIT ENCORE 26 ADVANTAGE (KITS) ×2 IMPLANT
KIT HEART LEFT (KITS) ×2 IMPLANT
PACK CARDIAC CATHETERIZATION (CUSTOM PROCEDURE TRAY) ×2 IMPLANT
STENT SYNERGY DES 3X24 (Permanent Stent) ×2 IMPLANT
SYR MEDRAD MARK V 150ML (SYRINGE) ×2 IMPLANT
TRANSDUCER W/STOPCOCK (MISCELLANEOUS) ×2 IMPLANT
TUBING CIL FLEX 10 FLL-RA (TUBING) ×2 IMPLANT
WIRE COUGAR XT STRL 190CM (WIRE) ×4 IMPLANT
WIRE HI TORQ VERSACORE-J 145CM (WIRE) ×2 IMPLANT
WIRE HI TORQ WHISPER MS 190CM (WIRE) ×2 IMPLANT
WIRE SAFE-T 1.5MM-J .035X260CM (WIRE) ×2 IMPLANT

## 2015-11-30 NOTE — Progress Notes (Signed)
ANTICOAGULATION CONSULT NOTE - Follow Up Consult  Pharmacy Consult for heparin Indication: NSTEMI   Labs:  Recent Labs  11/28/15 0750  11/28/15 1528  11/28/15 2008 11/29/15 0207 11/29/15 1033 11/29/15 2016 11/30/15 0514  HGB 12.6  --   --   --   --  11.8*  --   --  12.2  HCT 38.8  --   --   --   --  37.0  --   --  39.0  PLT 392  --   --   --   --  416*  --   --  401*  LABPROT  --   --  14.5  --   --   --   --   --   --   INR  --   --  1.11  --   --   --   --   --   --   HEPARINUNFRC  --   --   --   < >  --  0.78* 0.64 0.40 0.40  CREATININE 0.85  --   --   --   --  0.80  --   --   --   TROPONINI <0.03  < > 30.89*  --  19.55* 11.81*  --   --   --   < > = values in this interval not displayed.   Assessment: 70yo female admitted with complaints of chest pain. Elevated troponin. H/H and platelets WNL and no noted hstory of bleeding.   She has refused multiple medications in the past due to SEs/intolerances (beta blockers, statins, ASA, Diltiazem, Losartan)  Anticoagulation - CP . Heparin level 0.4.H/H stable and plateletes 401 - no bleeding   Goal of Therapy:  Heparin level 0.3-0.7 units/ml   Plan:  Continue heparin 500 units/hr Daily HL/CBC Monitor s/sx of bleeding  Jessicca Stitzer S. Alford Highland, PharmD, [place] Clinical Staff Pharmacist Pager [phone]   11/30/2015,10:27 AM

## 2015-11-30 NOTE — Interval H&P Note (Signed)
History and Physical Interval Note:  11/30/2015 12:29 PM  [name]  has presented today for cardiac cath with the diagnosis of NSTEMI  The various methods of treatment have been discussed with the patient and family. After consideration of risks, benefits and other options for treatment, the patient has consented to  Procedure(s): Left Heart Cath and Coronary Angiography (N/A) as a surgical intervention .  The patient's history has been reviewed, patient examined, no change in status, stable for surgery.  I have reviewed the patient's chart and labs.  Questions were answered to the patient's satisfaction.    Cath Lab Visit (complete for each Cath Lab visit)  Clinical Evaluation Leading to the Procedure:   ACS: Yes.    Non-ACS:    Anginal Classification: CCS IV  Anti-ischemic medical therapy: No Therapy  Non-Invasive Test Results: No non-invasive testing performed  Prior CABG: No previous CABG         Lauree Chandler

## 2015-11-30 NOTE — H&P (View-Only) (Signed)
Patient Name: [name] Date of Encounter: 11/30/2015   Primary Cardiologist: Dr. Aundra Dubin Patient Profile: [name] is a 70 year old female with past medical history of NSTEMI in 2012 with DES to LAD, carotid stenosis, HTN, HLD, and GERD.  Of note she has refused multiple cardiac meds in the past due to intolerances including beta blockers, statins, ASA. She was admitted on 11/28/15 with NSTEMI. Troponin 0.50>>30.89>>19.55. Plan is for cath today.   SUBJECTIVE: Feels well, denies chest pain, SOB and interscapular pain.   OBJECTIVE Filed Vitals:   11/29/15 1727 11/29/15 2018 11/30/15 0036 11/30/15 0500  BP: 129/64 142/72 118/61 138/66  Pulse: 68 71 67 69  Temp:  98.4 F (36.9 C) 97.6 F (36.4 C) 98.5 F (36.9 C)  TempSrc:  Oral Oral Oral  Resp:  18 18 18   Height:      Weight:    103 lb 14.4 oz (47.129 kg)  SpO2:  97% 97% 98%    Intake/Output Summary (Last 24 hours) at 11/30/15 0654 Last data filed at 11/30/15 [id number]  Gross per 24 hour  Intake 1389.68 ml  Output    450 ml  Net 939.68 ml   Filed Weights   11/28/15 1435 11/29/15 0520 11/30/15 0500  Weight: 105 lb 4.8 oz (47.764 kg) 103 lb 9.9 oz (47 kg) 103 lb 14.4 oz (47.129 kg)    PHYSICAL EXAM General: Well developed, well nourished, female in no acute distress. Head: Normocephalic, atraumatic.  Neck: Supple without bruits, no JVD. Lungs:  Resp regular and unlabored, CTA. Heart: RRR, S1, S2, no S3, S4, or murmur; no rub. Abdomen: Soft, non-tender, non-distended, BS + x 4.  Extremities: No clubbing, cyanosis, no edema.  Neuro: Alert and oriented X 3. Moves all extremities spontaneously. Psych: Normal affect.  LABS: CBC: Recent Labs  11/29/15 0207 11/30/15 0514  WBC 7.5 7.0  HGB 11.8* 12.2  HCT 37.0 39.0  MCV 91.1 94.0  PLT 416* 401*   INR: Recent Labs  11/28/15 1528  INR 0000000   Basic Metabolic Panel: Recent Labs  11/28/15 0750 11/29/15 0207  NA 137 138  K 3.6 3.5  CL 105 107  CO2  23 24  GLUCOSE 149* 109*  BUN 12 12  CREATININE 0.85 0.80  CALCIUM 8.6* 8.6*   Liver Function Tests: Recent Labs  11/29/15 0207  AST 91*  ALT 27  ALKPHOS 47  BILITOT 0.8  PROT 6.4*  ALBUMIN 3.5   Cardiac Enzymes: Recent Labs  11/28/15 1528 11/28/15 2008 11/29/15 0207  TROPONINI 30.89* 19.55* 11.81*    Fasting Lipid Panel: Recent Labs  11/29/15 0207  CHOL 229*  HDL 56  LDLCALC 154*  TRIG 96  CHOLHDL 4.1   Thyroid Function Tests: Recent Labs  11/28/15 1520  TSH 2.029    Current facility-administered medications:  .  0.9 %  sodium chloride infusion, 250 mL, Intravenous, PRN, Rhonda G Barrett, PA-C .  0.9 %  sodium chloride infusion, 250 mL, Intravenous, PRN, Rhonda G Barrett, PA-C .  [EXPIRED] 0.9% sodium chloride infusion, 3 mL/kg/hr, Intravenous, Continuous, Last Rate: 142.8 mL/hr at 11/30/15 0038, 3 mL/kg/hr at 11/30/15 0038 **FOLLOWED BY** 0.9% sodium chloride infusion, 1 mL/kg/hr, Intravenous, Continuous, Rhonda G Barrett, PA-C, Last Rate: 47.6 mL/hr at 11/30/15 0138, 1 mL/kg/hr at 11/30/15 0138 .  acetaminophen (TYLENOL) tablet 650 mg, 650 mg, Oral, Q4H PRN, Rhonda G Barrett, PA-C .  ALPRAZolam (XANAX) tablet 0.25 mg, 0.25 mg, Oral, BID PRN, Lonn Georgia,  PA-C .  aspirin EC tablet 81 mg, 81 mg, Oral, QODAY, Rhonda G Barrett, PA-C, 81 mg at 11/29/15 1000 .  carvedilol (COREG) tablet 3.125 mg, 3.125 mg, Oral, BID WC, Rhonda G Barrett, PA-C, 3.125 mg at 11/29/15 0951 .  [COMPLETED] heparin bolus via infusion 2,000 Units, 2,000 Units, Intravenous, Once, 2,000 Units at 11/28/15 1103 **FOLLOWED BY** heparin ADULT infusion 100 units/mL (25000 units/266mL sodium chloride 0.45%), 500 Units/hr, Intravenous, Continuous, Veronda P Bryk, RPH, Last Rate: 5 mL/hr at 11/30/15 0039, 500 Units/hr at 11/30/15 0039 .  nitroGLYCERIN (NITROGLYN) 2 % ointment 1 inch, 1 inch, Topical, Q6H, Rhonda G Barrett, PA-C, 1 inch at 11/30/15 0551 .  nitroGLYCERIN (NITROSTAT) SL tablet 0.4  mg, 0.4 mg, Sublingual, Q5 Min x 3 PRN, Rhonda G Barrett, PA-C .  ondansetron (ZOFRAN) injection 4 mg, 4 mg, Intravenous, Q6H PRN, Rhonda G Barrett, PA-C .  sodium chloride flush (NS) 0.9 % injection 3 mL, 3 mL, Intravenous, Q12H, Rhonda G Barrett, PA-C, 3 mL at 11/28/15 1445 .  sodium chloride flush (NS) 0.9 % injection 3 mL, 3 mL, Intravenous, PRN, Rhonda G Barrett, PA-C .  sodium chloride flush (NS) 0.9 % injection 3 mL, 3 mL, Intravenous, Q12H, Rhonda G Barrett, PA-C, 3 mL at 11/28/15 2200 .  sodium chloride flush (NS) 0.9 % injection 3 mL, 3 mL, Intravenous, PRN, Rhonda G Barrett, PA-C .  zolpidem (AMBIEN) tablet 5 mg, 5 mg, Oral, QHS PRN, Rhonda G Barrett, PA-C . sodium chloride 1 mL/kg/hr (11/30/15 0138)  . heparin 500 Units/hr (11/30/15 0039)    TELE:  NSR      ECG: NSR  Radiology/Studies: Dg Chest 2 View  11/28/2015  CLINICAL DATA:  Acute upper back pain. EXAM: CHEST  2 VIEW COMPARISON:  Radiograph of February 19, 2014. FINDINGS: The heart size and mediastinal contours are within normal limits. Both lungs are clear. No pneumothorax or pleural effusion is noted. The visualized skeletal structures are unremarkable. IMPRESSION: No active cardiopulmonary disease. Electronically Signed   By: Marijo Conception, M.D.   On: 11/28/2015 09:21     Current Medications:  . aspirin EC  81 mg Oral QODAY  . carvedilol  3.125 mg Oral BID WC  . nitroGLYCERIN  1 inch Topical Q6H  . sodium chloride flush  3 mL Intravenous Q12H  . sodium chloride flush  3 mL Intravenous Q12H   . sodium chloride 1 mL/kg/hr (11/30/15 0138)  . heparin 500 Units/hr (11/30/15 0039)    ASSESSMENT AND PLAN: Principal Problem:   NSTEMI (non-ST elevated myocardial infarction) ([place]) Active Problems:   Hyperlipidemia   Hypertension   1. NSTEMI: Troponin elevated at 0.50>>30.89>>19.55>>11.81. EKG shows T wave inversion in I, AVL. Plan is for heart cath today. Review of chart reveals that patient refuses to take any  medications long term. She prefers to manage her health with dietary modifications. Has intermittently refused meds while admitted. She is currently pain free, her anginal equivalent is interscapular pain which she currently denies. On heparin gtt. If she does get PCI, I worry about her compliance with DAPT. She says that aspirin makes her bleed profusely and says "it almost killed me".  Not sure what she is referencing to, and she has refused other long term meds in the past.   2. HTN: BP is somewhat well controlled. She is on carvedilol, refused last nights dose, but got 2 doses before that. She states that she will take Coreg for her BP, but only once a day  not BID.   3. HLD: LDL is 154, refuses all statin medications.    Signed, Arbutus Leas , NP 6:54 AM 11/30/2015 Pager [phone]   I have examined the patient and reviewed assessment and plan and discussed with patient.  Agree with above as stated.  NSTEMI. Patient tolerated clopidogrel in the past for a year.  She is hesitant to take both aspirin and clopidogrel together, due to concerns of bleeding.  I encouraged her to do this for at least a month.  THen she will probably go back to clopidogrel alone.  I think she should be ok for a DES; consider Synergy due to bioabsorbable polymer.    She is intolerant of statins.  COnsider PCSK9 inhibitor.   Cath later today.  Larae Grooms

## 2015-11-30 NOTE — Progress Notes (Signed)
Notified by RN that patient does not wish to have Echo done by female technician. Patient is aware that the Echo department is limited on female staff. Will try to get Echo done by female.

## 2015-11-30 NOTE — Progress Notes (Signed)
Patient Name: [name] Date of Encounter: 11/30/2015   Primary Cardiologist: Dr. Aundra Dubin Patient Profile: Ms. Peavler is a 70 year old female with past medical history of NSTEMI in 2012 with DES to LAD, carotid stenosis, HTN, HLD, and GERD.  Of note she has refused multiple cardiac meds in the past due to intolerances including beta blockers, statins, ASA. She was admitted on 11/28/15 with NSTEMI. Troponin 0.50>>30.89>>19.55. Plan is for cath today.   SUBJECTIVE: Feels well, denies chest pain, SOB and interscapular pain.   OBJECTIVE Filed Vitals:   11/29/15 1727 11/29/15 2018 11/30/15 0036 11/30/15 0500  BP: 129/64 142/72 118/61 138/66  Pulse: 68 71 67 69  Temp:  98.4 F (36.9 C) 97.6 F (36.4 C) 98.5 F (36.9 C)  TempSrc:  Oral Oral Oral  Resp:  18 18 18   Height:      Weight:    103 lb 14.4 oz (47.129 kg)  SpO2:  97% 97% 98%    Intake/Output Summary (Last 24 hours) at 11/30/15 0654 Last data filed at 11/30/15 [id number]  Gross per 24 hour  Intake 1389.68 ml  Output    450 ml  Net 939.68 ml   Filed Weights   11/28/15 1435 11/29/15 0520 11/30/15 0500  Weight: 105 lb 4.8 oz (47.764 kg) 103 lb 9.9 oz (47 kg) 103 lb 14.4 oz (47.129 kg)    PHYSICAL EXAM General: Well developed, well nourished, female in no acute distress. Head: Normocephalic, atraumatic.  Neck: Supple without bruits, no JVD. Lungs:  Resp regular and unlabored, CTA. Heart: RRR, S1, S2, no S3, S4, or murmur; no rub. Abdomen: Soft, non-tender, non-distended, BS + x 4.  Extremities: No clubbing, cyanosis, no edema.  Neuro: Alert and oriented X 3. Moves all extremities spontaneously. Psych: Normal affect.  LABS: CBC: Recent Labs  11/29/15 0207 11/30/15 0514  WBC 7.5 7.0  HGB 11.8* 12.2  HCT 37.0 39.0  MCV 91.1 94.0  PLT 416* 401*   INR: Recent Labs  11/28/15 1528  INR 0000000   Basic Metabolic Panel: Recent Labs  11/28/15 0750 11/29/15 0207  NA 137 138  K 3.6 3.5  CL 105 107  CO2  23 24  GLUCOSE 149* 109*  BUN 12 12  CREATININE 0.85 0.80  CALCIUM 8.6* 8.6*   Liver Function Tests: Recent Labs  11/29/15 0207  AST 91*  ALT 27  ALKPHOS 47  BILITOT 0.8  PROT 6.4*  ALBUMIN 3.5   Cardiac Enzymes: Recent Labs  11/28/15 1528 11/28/15 2008 11/29/15 0207  TROPONINI 30.89* 19.55* 11.81*    Fasting Lipid Panel: Recent Labs  11/29/15 0207  CHOL 229*  HDL 56  LDLCALC 154*  TRIG 96  CHOLHDL 4.1   Thyroid Function Tests: Recent Labs  11/28/15 1520  TSH 2.029    Current facility-administered medications:  .  0.9 %  sodium chloride infusion, 250 mL, Intravenous, PRN, Rhonda G Barrett, PA-C .  0.9 %  sodium chloride infusion, 250 mL, Intravenous, PRN, Rhonda G Barrett, PA-C .  [EXPIRED] 0.9% sodium chloride infusion, 3 mL/kg/hr, Intravenous, Continuous, Last Rate: 142.8 mL/hr at 11/30/15 0038, 3 mL/kg/hr at 11/30/15 0038 **FOLLOWED BY** 0.9% sodium chloride infusion, 1 mL/kg/hr, Intravenous, Continuous, Rhonda G Barrett, PA-C, Last Rate: 47.6 mL/hr at 11/30/15 0138, 1 mL/kg/hr at 11/30/15 0138 .  acetaminophen (TYLENOL) tablet 650 mg, 650 mg, Oral, Q4H PRN, Rhonda G Barrett, PA-C .  ALPRAZolam (XANAX) tablet 0.25 mg, 0.25 mg, Oral, BID PRN, Lonn Georgia,  PA-C .  aspirin EC tablet 81 mg, 81 mg, Oral, QODAY, Rhonda G Barrett, PA-C, 81 mg at 11/29/15 1000 .  carvedilol (COREG) tablet 3.125 mg, 3.125 mg, Oral, BID WC, Rhonda G Barrett, PA-C, 3.125 mg at 11/29/15 0951 .  [COMPLETED] heparin bolus via infusion 2,000 Units, 2,000 Units, Intravenous, Once, 2,000 Units at 11/28/15 1103 **FOLLOWED BY** heparin ADULT infusion 100 units/mL (25000 units/253mL sodium chloride 0.45%), 500 Units/hr, Intravenous, Continuous, Veronda P Bryk, RPH, Last Rate: 5 mL/hr at 11/30/15 0039, 500 Units/hr at 11/30/15 0039 .  nitroGLYCERIN (NITROGLYN) 2 % ointment 1 inch, 1 inch, Topical, Q6H, Rhonda G Barrett, PA-C, 1 inch at 11/30/15 0551 .  nitroGLYCERIN (NITROSTAT) SL tablet 0.4  mg, 0.4 mg, Sublingual, Q5 Min x 3 PRN, Rhonda G Barrett, PA-C .  ondansetron (ZOFRAN) injection 4 mg, 4 mg, Intravenous, Q6H PRN, Rhonda G Barrett, PA-C .  sodium chloride flush (NS) 0.9 % injection 3 mL, 3 mL, Intravenous, Q12H, Rhonda G Barrett, PA-C, 3 mL at 11/28/15 1445 .  sodium chloride flush (NS) 0.9 % injection 3 mL, 3 mL, Intravenous, PRN, Rhonda G Barrett, PA-C .  sodium chloride flush (NS) 0.9 % injection 3 mL, 3 mL, Intravenous, Q12H, Rhonda G Barrett, PA-C, 3 mL at 11/28/15 2200 .  sodium chloride flush (NS) 0.9 % injection 3 mL, 3 mL, Intravenous, PRN, Rhonda G Barrett, PA-C .  zolpidem (AMBIEN) tablet 5 mg, 5 mg, Oral, QHS PRN, Rhonda G Barrett, PA-C . sodium chloride 1 mL/kg/hr (11/30/15 0138)  . heparin 500 Units/hr (11/30/15 0039)    TELE:  NSR      ECG: NSR  Radiology/Studies: Dg Chest 2 View  11/28/2015  CLINICAL DATA:  Acute upper back pain. EXAM: CHEST  2 VIEW COMPARISON:  Radiograph of February 19, 2014. FINDINGS: The heart size and mediastinal contours are within normal limits. Both lungs are clear. No pneumothorax or pleural effusion is noted. The visualized skeletal structures are unremarkable. IMPRESSION: No active cardiopulmonary disease. Electronically Signed   By: Marijo Conception, M.D.   On: 11/28/2015 09:21     Current Medications:  . aspirin EC  81 mg Oral QODAY  . carvedilol  3.125 mg Oral BID WC  . nitroGLYCERIN  1 inch Topical Q6H  . sodium chloride flush  3 mL Intravenous Q12H  . sodium chloride flush  3 mL Intravenous Q12H   . sodium chloride 1 mL/kg/hr (11/30/15 0138)  . heparin 500 Units/hr (11/30/15 0039)    ASSESSMENT AND PLAN: Principal Problem:   NSTEMI (non-ST elevated myocardial infarction) ([place]) Active Problems:   Hyperlipidemia   Hypertension   1. NSTEMI: Troponin elevated at 0.50>>30.89>>19.55>>11.81. EKG shows T wave inversion in I, AVL. Plan is for heart cath today. Review of chart reveals that patient refuses to take any  medications long term. She prefers to manage her health with dietary modifications. Has intermittently refused meds while admitted. She is currently pain free, her anginal equivalent is interscapular pain which she currently denies. On heparin gtt. If she does get PCI, I worry about her compliance with DAPT. She says that aspirin makes her bleed profusely and says "it almost killed me".  Not sure what she is referencing to, and she has refused other long term meds in the past.   2. HTN: BP is somewhat well controlled. She is on carvedilol, refused last nights dose, but got 2 doses before that. She states that she will take Coreg for her BP, but only once a day  not BID.   3. HLD: LDL is 154, refuses all statin medications.    Signed, Arbutus Leas , NP 6:54 AM 11/30/2015 Pager [phone]   I have examined the patient and reviewed assessment and plan and discussed with patient.  Agree with above as stated.  NSTEMI. Patient tolerated clopidogrel in the past for a year.  She is hesitant to take both aspirin and clopidogrel together, due to concerns of bleeding.  I encouraged her to do this for at least a month.  THen she will probably go back to clopidogrel alone.  I think she should be ok for a DES; consider Synergy due to bioabsorbable polymer.    She is intolerant of statins.  COnsider PCSK9 inhibitor.   Cath later today.  Larae Grooms

## 2015-12-01 ENCOUNTER — Encounter (HOSPITAL_COMMUNITY): Payer: Self-pay | Admitting: Student

## 2015-12-01 ENCOUNTER — Telehealth: Payer: Self-pay | Admitting: Cardiology

## 2015-12-01 DIAGNOSIS — Z955 Presence of coronary angioplasty implant and graft: Secondary | ICD-10-CM | POA: Insufficient documentation

## 2015-12-01 LAB — BASIC METABOLIC PANEL
ANION GAP: 8 (ref 5–15)
BUN: 10 mg/dL (ref 6–20)
CHLORIDE: 107 mmol/L (ref 101–111)
CO2: 23 mmol/L (ref 22–32)
Calcium: 8.6 mg/dL — ABNORMAL LOW (ref 8.9–10.3)
Creatinine, Ser: 0.85 mg/dL (ref 0.44–1.00)
GFR calc Af Amer: >60 mL/min (ref >60)
Glucose, Bld: 97 mg/dL (ref 65–99)
POTASSIUM: 3.5 mmol/L (ref 3.5–5.1)
SODIUM: 138 mmol/L (ref 135–145)

## 2015-12-01 LAB — ECHOCARDIOGRAM COMPLETE
Height: 58 in
Weight: 1662.4 oz

## 2015-12-01 LAB — CBC
HEMATOCRIT: 35.5 % — AB (ref 36.0–46.0)
HEMOGLOBIN: 11.3 g/dL — AB (ref 12.0–15.0)
MCH: 29.5 pg (ref 26.0–34.0)
MCHC: 31.8 g/dL (ref 30.0–36.0)
MCV: 92.7 fL (ref 78.0–100.0)
Platelets: 387 10*3/uL (ref 150–400)
RBC: 3.83 MIL/uL — ABNORMAL LOW (ref 3.87–5.11)
RDW: 12.6 % (ref 11.5–15.5)
WBC: 8.1 10*3/uL (ref 4.0–10.5)

## 2015-12-01 MED ORDER — CLOPIDOGREL BISULFATE 75 MG PO TABS: 75.0000 mg | ORAL_TABLET | Freq: Every day | ORAL | Status: DC

## 2015-12-01 MED ORDER — CARVEDILOL 3.125 MG PO TABS: 3.1250 mg | ORAL_TABLET | Freq: Two times a day (BID) | ORAL | Status: DC

## 2015-12-01 MED ORDER — ATORVASTATIN CALCIUM 10 MG PO TABS: 10.0000 mg | ORAL_TABLET | Freq: Every day | ORAL | Status: DC

## 2015-12-01 MED ORDER — NITROGLYCERIN 0.4 MG SL SUBL: 0.4000 mg | SUBLINGUAL_TABLET | SUBLINGUAL | Status: DC | PRN

## 2015-12-01 NOTE — Progress Notes (Signed)
Hospital Problem List     Principal Problem:   NSTEMI (non-ST elevated myocardial infarction) [facility]) Active Problems:   Hyperlipidemia   Hypertension    Patient Profile:   Primary Cardiologist: Dr. Aundra Dubin  70 year old female w/ PMH of NSTEMI (DES to LAD in 2012), carotid stenosis, HTN, HLD, and GERD. Of note she has refused multiple cardiac meds in the past due to intolerances including beta blockers, statins, ASA. She was admitted on 11/28/15 with NSTEMI. Troponin 0.50>>30.89>>19.55.   Subjective   Denies any chest pain or shortness of breath overnight. Right radial cath site stable.  Inpatient Medications    . aspirin EC  81 mg Oral QODAY  . carvedilol  3.125 mg Oral BID WC  . clopidogrel  75 mg Oral Q breakfast  . sodium chloride flush  3 mL Intravenous Q12H    Vital Signs    Filed Vitals:   11/30/15 1500 11/30/15 1600 11/30/15 2033 12/01/15 0650  BP: 116/60 119/48 132/76   Pulse: 68 66    Temp:   98.6 F (37 C) 98.1 F (36.7 C)  TempSrc:   Oral Oral  Resp: 18 25 25    Height:      Weight:      SpO2: 98% 99%      Intake/Output Summary (Last 24 hours) at 12/01/15 0805 Last data filed at 12/01/15 0700  Gross per 24 hour  Intake 571.25 ml  Output   1500 ml  Net -928.75 ml   Filed Weights   11/28/15 1435 11/29/15 0520 11/30/15 0500  Weight: 105 lb 4.8 oz (47.764 kg) 103 lb 9.9 oz (47 kg) 103 lb 14.4 oz (47.129 kg)    Physical Exam    General: Well developed, well nourished, female appearing in no acute distress. Head: Normocephalic, atraumatic.  Neck: Supple without bruits, JVD not elevated. Lungs:  Resp regular and unlabored, CTA without wheezing or rales. Heart: RRR, S1, S2, no S3, S4, or murmur; no rub. Abdomen: Soft, non-tender, non-distended with normoactive bowel sounds. No hepatomegaly. No rebound/guarding. No obvious abdominal masses. Extremities: No clubbing, cyanosis, or edema. Distal pedal pulses are 2+ bilaterally. Right radial cath site  without evidence of ecchymosis or a hematoma. Neuro: Alert and oriented X 3. Moves all extremities spontaneously. Psych: Normal affect.  Labs    CBC  Recent Labs  11/30/15 0514 12/01/15 0435  WBC 7.0 8.1  HGB 12.2 11.3*  HCT 39.0 35.5*  MCV 94.0 92.7  PLT 401* [id number]   Basic Metabolic Panel  Recent Labs  11/29/15 0207 12/01/15 0435  NA 138 138  K 3.5 3.5  CL 107 107  CO2 24 23  GLUCOSE 109* 97  BUN 12 10  CREATININE 0.80 0.85  CALCIUM 8.6* 8.6*   Liver Function Tests  Recent Labs  11/29/15 0207  AST 91*  ALT 27  ALKPHOS 47  BILITOT 0.8  PROT 6.4*  ALBUMIN 3.5   No results for input(s): LIPASE, AMYLASE in the last 72 hours. Cardiac Enzymes  Recent Labs  11/28/15 1528 11/28/15 2008 11/29/15 0207  TROPONINI 30.89* 19.55* 11.81*   BNP Invalid input(s): POCBNP D-Dimer No results for input(s): DDIMER in the last 72 hours. Hemoglobin A1C  Recent Labs  11/28/15 1520  HGBA1C 5.5   Fasting Lipid Panel  Recent Labs  11/29/15 0207  CHOL 229*  HDL 56  LDLCALC 154*  TRIG 96  CHOLHDL 4.1   Thyroid Function Tests  Recent Labs  11/28/15 1520  TSH 2.029  Telemetry    NSR, HR in 60's - 70's. No atopic events.  ECG    NSR, HR 70, no acute ST or T-wave changes.   Cardiac Studies and Radiology    Dg Chest 2 View: 11/28/2015  CLINICAL DATA:  Acute upper back pain. EXAM: CHEST  2 VIEW COMPARISON:  Radiograph of February 19, 2014. FINDINGS: The heart size and mediastinal contours are within normal limits. Both lungs are clear. No pneumothorax or pleural effusion is noted. The visualized skeletal structures are unremarkable. IMPRESSION: No active cardiopulmonary disease. Electronically Signed   By: Marijo Conception, M.D.   On: 11/28/2015 09:21    Cardiac Catheterization: 11/30/2015   Prox RCA lesion, 40% stenosed.  Prox LAD lesion, 99% stenosed.  Ost 1st Diag to 1st Diag lesion, 90% stenosed. Post intervention, there is a 30% residual  stenosis.  The left ventricular systolic function is normal.  1. Severe restenosis proximal LAD stent with involvement of the moderate caliber Diagonal branch which is jailed by the old stent.  2. Successful PTCA/DES x 1 proximal LAD 3. Successful PTCA/angioplasty only Diagonal branch (jailed by old stent) 4. NSTEMI 5. Normal LV systolic function  Recommendations: Will continue ASA and Plavix for one year if she tolerates. She is intolerant of statins.   Assessment & Plan    1. NSTEMI - Troponin elevated at 0.50>>30.89>>19.55>>11.81. EKG shows T wave inversion in I, AVL. - cardiac catheterization on 11/30/2015 showed 99% stenosis in the Prox LAD and 90% stenosis in the Diagonal branch (jailed by old stent). DES placed to proximal LAD and PTCA/Angioplasty only performed on the Diagonal branch.  - will continue on ASA and Plavix. Patient educated on the importance of this, for she reports a history of bleeding in the past. Per Dr. Irish Lack, she will need to be on ASA and Plavix for at least 1 month, then can possibly go to Plavix alone if unable to tolerate both. - she reports only taking Coreg once daily, and refuses BID dosing, saying she lives a healthy lifestyle and does not want to take it twice daily.  2. HTN - BP well controlled this AM.  - continue BB (patient refuses to take BID, only takes once daily). Will resume ACE-I at discharge.   3. HLD - LDL is 154. Has refused all statin medications in the past, saying they interfere with her sleep. She is willing to try "low-dose Lipitor", for she has tolerated it better than others in the past. Will start on Atorvastain 10mg  daily. Titrate up as an outpatient if she is able to tolerate this.  - can consider PCSK9 inhibitor as an outpatient.   Arna Medici , PA-C 8:05 AM 12/01/2015 Pager: [phone]   I have examined the patient and reviewed assessment and plan and discussed with patient.  Agree with above as  stated.  She understands the importance of DAPT.  She is agreeable to start low dose lipitor as well. Right radial site intact.  Plan discharge later today.  Larae Grooms

## 2015-12-01 NOTE — Telephone Encounter (Signed)
New Message  TCM 6/27 8:30 Pharmacist appt 9:00 Richardson Dopp per [place]

## 2015-12-01 NOTE — Progress Notes (Signed)
CARDIAC REHAB PHASE I   PRE:  Rate/Rhythm: 88 SR  BP:  Supine:   Sitting: 145/58  Standing:    SaO2: 100%RA  MODE:  Ambulation: 600 ft   POST:  Rate/Rhythm: 89 SR  BP:  Supine:   Sitting: 127/63  Standing:    SaO2:  0810-0903 Pt walked 600 ft with steady gait. No CP. Tolerated well. MI education completed with pt and husband. Understanding voiced. Stressed importance of statin with LDL of 154 and importance of plavix with stent. Pt follows heart healthy diet except eats possibly too many sweets. Does not eat meat, but likes vegetables and fruits. Discussed CRP 2 and will refer to [place]. Reviewed NTG use, MI restrictions and ex ed.   Graylon Good, RN BSN  12/01/2015 9:00 AM

## 2015-12-01 NOTE — Care Management Note (Addendum)
Case Management Note  Patient Details  Name: [name] MRN: [id number] Date of Birth: Jul 29, 1945  Subjective/Objective:     NSTEMI, PTCA/DES x 1 proximal LAD               Action/Plan: Discharge Planning: AVS reviewed: Chart reviewed. Lives at home with husband. No problems with paying for medications. No NCM needs identified.   PCP - Ruben Reason MD   Expected Discharge Date:  12/01/2015               Expected Discharge Plan:  Home/Self Care  In-House Referral:  NA  Discharge planning Services  CM Consult  Post Acute Care Choice:  NA Choice offered to:  NA  DME Arranged:  N/A DME Agency:  NA  HH Arranged:  NA HH Agency:  NA  Status of Service:  Completed, signed off  Medicare Important Message Given:    Date Medicare IM Given:    Medicare IM give by:    Date Additional Medicare IM Given:    Additional Medicare Important Message give by:     If discussed at [place] of Stay Meetings, dates discussed:    Additional Comments:  Erenest Rasher, RN 12/01/2015, 10:39 AM

## 2015-12-01 NOTE — Progress Notes (Signed)
Noted reddened area on right forearm, with raised center.  Appears to be insect bite.  Referred to [place], [place].

## 2015-12-01 NOTE — Discharge Summary (Signed)
Discharge Summary    Patient ID: [name],  MRN: [id number], DOB/AGE: April 01, 1946 70 y.o.  Admit date: 11/28/2015 Discharge date: 12/01/2015  Primary Care Provider: HOPPER,DAVID Primary Cardiologist: Dr. Aundra Dubin  Discharge Diagnoses    Principal Problem:   NSTEMI (non-ST elevated myocardial infarction) [facility]) Active Problems:   Hyperlipidemia   Hypertension   History of Present Illness     [name] is a 70 y.o. female with past medical history of CAD (s/p DES to LAD in 2012), carotid stenosis, HTN, HLD, and GERD who presented to [facility] ED on 11/28/2015 for evaluation of chest pain.  She reported developing back pain and a heartburn sensation earlier that evening while walking around her garden. Her initial troponin value was <0.03 but repeat value was 0.50. Her EKG also showed TWI in Leads I and AVL (new from previous tracings). Therefore, she was started on IV Heparin and admitted with anticipation of a cardiac catheterization the following morning (11/30/2015).    Hospital Course     Consultants: None  Over the weekend, she denied any repeat episodes of chest discomfort. Her troponin values continued to trend upwards, with a peak value of 30.89. Of note, she had refused multiple cardiac meds in the past due to intolerances including beta blockers, statins, and ASA. She refused to be on a statin due to having headaches and saying they made her feel tired. She was in agreement to starting Coreg, but refuses to take this BID, saying she wishes to treat with natural remedies. She was on ASA in the past but stopped this due to nosebleeds.    She went for cardiac catheterization on 11/30/2015 which showed 99% stenosis in the Prox LAD and 90% stenosis in the Diagonal branch (jailed by old stent). DES was placed to the proximal LAD and PTCA/Angioplasty only was performed on the Diagonal branch. She was started on DAPT with ASA and Plavix. She tolerated the procedure  well and no complications were noted.  The following morning, she denied any chest discomfort or dyspnea. Her right radial cath site was stable without evidence of ecchymosis or a hematoma. She ambulated over 600 ft with cardiac rehab without difficulty. Labs and vitals were reviewed and appeared stable. She was educated on the importance of DAPT by both myself, Dr. Irish Lack, and cardiac rehab. She voiced understanding of the importance of this. She is willing to try low-dose Atorvastatin as well to see if she can tolerate this. If not, would consider referral for PCSK9 inhibitor, for her LDL was 154 this admission.  She was last examined by Dr. Irish Lack and deemed stable for discharge. A 7-day TCM appointment was arranged prior to discharge.   _____________  Discharge Vitals Blood pressure 127/63, pulse 66, temperature 98.1 F (36.7 C), temperature source Oral, resp. rate 15, height 4\' 10"  (1.473 m), weight 103 lb 8 oz (46.947 kg), SpO2 99 %.  Filed Weights   11/29/15 0520 11/30/15 0500 12/01/15 0800  Weight: 103 lb 9.9 oz (47 kg) 103 lb 14.4 oz (47.129 kg) 103 lb 8 oz (46.947 kg)    Labs & Radiologic Studies     CBC  Recent Labs  11/30/15 0514 12/01/15 0435  WBC 7.0 8.1  HGB 12.2 11.3*  HCT 39.0 35.5*  MCV 94.0 92.7  PLT 401* [id number]   Basic Metabolic Panel  Recent Labs  11/29/15 0207 12/01/15 0435  NA 138 138  K 3.5 3.5  CL 107 107  CO2 24  23  GLUCOSE 109* 97  BUN 12 10  CREATININE 0.80 0.85  CALCIUM 8.6* 8.6*   Liver Function Tests  Recent Labs  11/29/15 0207  AST 91*  ALT 27  ALKPHOS 47  BILITOT 0.8  PROT 6.4*  ALBUMIN 3.5   No results for input(s): LIPASE, AMYLASE in the last 72 hours. Cardiac Enzymes  Recent Labs  11/28/15 1528 11/28/15 2008 11/29/15 0207  TROPONINI 30.89* 19.55* 11.81*   BNP Invalid input(s): POCBNP D-Dimer No results for input(s): DDIMER in the last 72 hours. Hemoglobin A1C  Recent Labs  11/28/15 1520  HGBA1C 5.5    Fasting Lipid Panel  Recent Labs  11/29/15 0207  CHOL 229*  HDL 56  LDLCALC 154*  TRIG 96  CHOLHDL 4.1   Thyroid Function Tests  Recent Labs  11/28/15 1520  TSH 2.029    Dg Chest 2 View: 11/28/2015  CLINICAL DATA:  Acute upper back pain. EXAM: CHEST  2 VIEW COMPARISON:  Radiograph of February 19, 2014. FINDINGS: The heart size and mediastinal contours are within normal limits. Both lungs are clear. No pneumothorax or pleural effusion is noted. The visualized skeletal structures are unremarkable. IMPRESSION: No active cardiopulmonary disease. Electronically Signed   By: Marijo Conception, M.D.   On: 11/28/2015 09:21    Diagnostic Studies/Procedures    Cardiac Catheterization: 11/30/2015   Prox RCA lesion, 40% stenosed.  Prox LAD lesion, 99% stenosed.  Ost 1st Diag to 1st Diag lesion, 90% stenosed. Post intervention, there is a 30% residual stenosis.  The left ventricular systolic function is normal.  1. Severe restenosis proximal LAD stent with involvement of the moderate caliber Diagonal branch which is jailed by the old stent.  2. Successful PTCA/DES x 1 proximal LAD 3. Successful PTCA/angioplasty only Diagonal branch (jailed by old stent) 4. NSTEMI 5. Normal LV systolic function  Recommendations: Will continue ASA and Plavix for one year if she tolerates. She is intolerant of statins.    Echocardiogram: 11/30/2015 Study Conclusions - Left ventricle: The cavity size was normal. Systolic function was  normal. The estimated ejection fraction was in the range of 60%  to 65%. Mild hypokinesis of the anterior myocardium. Doppler  parameters are consistent with abnormal left ventricular  relaxation (grade 1 diastolic dysfunction). Doppler parameters  are consistent with indeterminate ventricular filling pressure. - Aortic valve: Transvalvular velocity was within the normal range.  There was no stenosis. There was no regurgitation. - Mitral valve: Transvalvular  velocity was within the normal range.  There was no evidence for stenosis. There was trivial  regurgitation. - Right ventricle: The cavity size was normal. Wall thickness was  normal. Systolic function was normal. - Atrial septum: No defect or patent foramen ovale was identified  by color flow Doppler. - Tricuspid valve: There was mild regurgitation. - Pulmonary arteries: Systolic pressure was within the normal  range. PA peak pressure: 23 mm Hg (S).    Disposition   Pt is being discharged home today in good condition.  Follow-up Plans & Appointments    Follow-up Information    Follow up with Richardson Dopp, PA-C On 12/08/2015.   Specialties:  Cardiology, Physician Assistant   Why:  Cardiology Hospital Follow-Up on 12/08/2015 at 8:30 (with Pharmacist) and 9:00 (with Provider)   Contact information:   [address] [address] [phone]      Discharge Instructions    Amb Referral to Cardiac Rehabilitation    Complete by:  As directed  Diagnosis:   NSTEMI Coronary Stents       Diet - low sodium heart healthy    Complete by:  As directed      Discharge instructions    Complete by:  As directed   PLEASE REMEMBER TO BRING ALL OF YOUR MEDICATIONS TO EACH OF YOUR FOLLOW-UP OFFICE VISITS.  PLEASE ATTEND ALL SCHEDULED FOLLOW-UP APPOINTMENTS.   Activity: Increase activity slowly as tolerated. You may shower, but no soaking baths (or swimming) for 1 week. No driving for 24 hours. No lifting over 5 lbs for 1 week. No sexual activity for 1 week.   You May Return to Work: in 1 week (if applicable)  Wound Care: You may wash cath site gently with soap and water. Keep cath site clean and dry. If you notice pain, swelling, bleeding or pus at your cath site, please call [phone].     Increase activity slowly    Complete by:  As directed            Discharge Medications   Discharge Medication List as of 12/01/2015 10:39 AM    START taking  these medications   Details  atorvastatin (LIPITOR) 10 MG tablet Take 1 tablet (10 mg total) by mouth daily at 6 PM., Starting 12/01/2015, Until Discontinued, Normal    carvedilol (COREG) 3.125 MG tablet Take 1 tablet (3.125 mg total) by mouth 2 (two) times daily with a meal., Starting 12/01/2015, Until Discontinued, Normal    clopidogrel (PLAVIX) 75 MG tablet Take 1 tablet (75 mg total) by mouth daily with breakfast., Starting 12/01/2015, Until Discontinued, Normal      CONTINUE these medications which have CHANGED   Details  nitroGLYCERIN (NITROSTAT) 0.4 MG SL tablet Place 1 tablet (0.4 mg total) under the tongue every 5 (five) minutes as needed for chest pain., Starting 12/01/2015, Until Discontinued, Normal      CONTINUE these medications which have NOT CHANGED   Details  aspirin EC 81 MG tablet Take 1 tablet (81 mg total) by mouth every other day., Starting 04/17/2014, Until Discontinued, No Print    lisinopril (PRINIVIL,ZESTRIL) 2.5 MG tablet Take 1 tablet (2.5 mg total) by mouth daily., Starting 06/23/2014, Until Discontinued, Normal         Aspirin prescribed at discharge?  Yes High Intensity Statin Prescribed? (Lipitor 40-80mg  or Crestor 20-40mg ): No: Intolerant; Being re-challenged with Lipitor 10 mg daily Beta Blocker Prescribed? Yes For EF 40% or less, Was ACEI/ARB Prescribed? No: N/A ADP Receptor Inhibitor Prescribed? (i.e. Plavix etc.-Includes Medically Managed Patients): Yes For EF <40%, Aldosterone Inhibitor Prescribed? No: N/A Was EF assessed during THIS hospitalization? Yes - Cardiac Cath Was Cardiac Rehab II ordered? (Included Medically managed Patients): Yes   Allergies Allergies  Allergen Reactions  . Atorvastatin Other (See Comments)    Muscle pain  . Crestor [Rosuvastatin] Other (See Comments)    Muscle pain  . Ibuprofen     REACTION: rapid heart rate  . Pravastatin Sodium Other (See Comments)    Muscle cramps/ confusion     Outstanding Labs/Studies    None  Duration of Discharge Encounter   Greater than 30 minutes including physician time.  Signed, Erma Heritage, PA-C 12/01/2015, 11:48 AM   I have examined the patient and reviewed assessment and plan and discussed with patient. Agree with above as stated. She understands the importance of DAPT. She is agreeable to start low dose lipitor as well. Right radial site intact. Plan discharge later today.  Larae Grooms

## 2015-12-01 NOTE — Progress Notes (Signed)
Went over discharge instructions with the patient, including medication education.  Patient appears very reluctant to take medications as prescribed.  I was happy to see the appt. scheduled with a pharmacist, although she also questioned the need for that.

## 2015-12-01 NOTE — Plan of Care (Signed)
Problem: Health Behavior/Discharge Planning: Goal: Ability to safely manage health-related needs after discharge will improve Outcome: Completed/Met Date Met:  12/01/15 See progress note regarding medications.

## 2015-12-02 MED FILL — Heparin Sodium (Porcine) 2 Unit/ML in Sodium Chloride 0.9%: INTRAMUSCULAR | Qty: 500 | Status: AC

## 2015-12-02 NOTE — Telephone Encounter (Signed)
Patient contacted regarding discharge from [facility] on 12/01/15.  Patient understands to follow up with provider Richardson Dopp, PA on 6/27 at 9:00 at [facility]. Patient understands discharge instructions? Yes Patient understands medications and regiment? Yes Patient understands to bring all medications to this visit? Yes  Patient denies complaints at present and verbalized understanding of instructions and follow-up

## 2015-12-07 NOTE — Progress Notes (Addendum)
Cardiology Office Note:    Date:  12/08/2015   ID:  [name], [name] 11-01-1945, MRN [id number]  PCP:  Leandrew Koyanagi, MD  Cardiologist:  Dr. Loralie Champagne  >> will establish with Dr. Lauree Chandler (Dr. Aundra Dubin changing to [facility] Clinic) Electrophysiologist:  n/a  Referring MD: Posey Boyer, MD   Chief Complaint  Patient presents with  . Hospitalization Follow-up    s/p NSTEMI >> PCI  . Transitional Care Management    7 day hospital FU    History of Present Illness:     [name] is a 70 y.o. female with a hx of CAD s/p NSTEMI 04/2011 tx with DES to mid LAD, carotid stenosis, HTN, HL, GERD, PAC/PVCs. At the time of her MI, she was reluctant to start most medications. She has refused multiple medications in the past due to SEs/intolerances (beta blockers, statins, ASA, Diltiazem, Losartan). Relook heart catheterization in 11/12 done for continued chest pain and fatigue: LAD stent patent, proximal RCA 25%, EF 65%. Last seen in 2/16.   Admitted 6/17-6/20 with a non-STEMI. Peak troponin 30.89. Of note, she continued to request to treat her CAD with natural remedies. LHC demonstrated 99% proximal LAD stenosis involving the previous stent and 90% stenosis in the diagonal branch jailed by the previous stent. She underwent PCI with DES to LAD and angioplasty to the diagonal. Dual antiplatelet therapy was recommended with aspirin and Plavix. Of note, she was agreeable to start low-dose carvedilol and Lipitor.   She returns for follow-up. She is here alone. She met with the pharmacist earlier. The patient notes that she stopped taking lisinopril and carvedilol secondary to abdominal pain and diarrhea. She's had multiple side effects with beta blockers in the past. She is taking clopidogrel every day. She is taking aspirin every other day. She has been taking atorvastatin. She denies further chest discomfort. She denies dyspnea. She denies orthopnea, PND or edema. She  denies syncope. She denies any bleeding issues.   Past Medical History  Diagnosis Date  . GERD (gastroesophageal reflux disease)   . Hiatal hernia   . Arthritis   . HTN (hypertension)     a. RA duplex 11/15: norma caliber aorta, norma RA bilaterally   . HLD (hyperlipidemia)   . CAD (coronary artery disease)     a. s/p NSTEMI 10/12: LHC 04/02/11: mLAD 95%, EF 60%; PCI: Promus DES to mLAD. b. LHC 04/29/11:  LAD stent patent, proximal RCA 25%, EF 65%  c. 11/2015 - NSTEMI: cath w/ 99% stenosis prox LAD (DES placed) and 90% stenosis D1 (jailed by old stent, POBA alone performed), pRCA 40, EF > 65%  . Multinodular thyroid   . Lung nodule     a. Pt initially refused further workup. b. Per pulm note, resolved 02/19/14 and never smoker so no further f/u needed.  [name] History of noncompliance with medical treatment   . Carotid stenosis 2015    a. Carotid US 8/15: 40-59% bilateral ICA stenosis, repeat 1 year.  [name] PVC's (premature ventricular contractions)   . HBV (hepatitis B virus) infection   . History of echocardiogram     a. Echo 6/17:  EF 60-65%, mild anterior HK, grade 1 diastolic dysfunction, mild TR, PASP 23 mmHg  1. GERD with hiatal hernia 2. HBV 3. HTN 4. Hyperlipidemia 5. CAD: NSTEMI 10/12 with 95% mLAD stenosis tx with Promus DES. EF 60% on LV-gram. Relook cath in 11/12 with patent LAD stent, EF 65%.  6. Medical  noncompliance.  7. Carotid stenosis: carotid dopplers (4/13) 67-12% RICA, 45-80% LICA. Carotid dopplers (9/15) with 40-59% bilateral ICA stenosis.  8. Thyroid nodule.  9. PVCs/PACs: Holter (1/15) with rare PACs/PVCs  Past Surgical History  Procedure Laterality Date  . Cesarean section  1985  . Tonsillectomy    . Excisional hemorrhoidectomy  1999  . Coronary angioplasty with stent placement  2012; 11/30/2015  . Lipoma excision Right ~ 2015    "popliteal"  . Biopsy thyroid      "negative"  . Cardiac catheterization N/A 11/30/2015    Procedure: Left Heart Cath and  Coronary Angiography;  Surgeon: Burnell Blanks, MD;  Location: [facility];  Service: Cardiovascular;  Laterality: N/A;    Current Medications: Outpatient Prescriptions Prior to Visit  Medication Sig Dispense Refill  . atorvastatin (LIPITOR) 10 MG tablet Take 1 tablet (10 mg total) by mouth daily at 6 PM. 30 tablet 6  . clopidogrel (PLAVIX) 75 MG tablet Take 1 tablet (75 mg total) by mouth daily with breakfast. 30 tablet 11  . nitroGLYCERIN (NITROSTAT) 0.4 MG SL tablet Place 1 tablet (0.4 mg total) under the tongue every 5 (five) minutes as needed for chest pain. 25 tablet 3  . aspirin EC 81 MG tablet Take 1 tablet (81 mg total) by mouth every other day. 90 tablet 3  . carvedilol (COREG) 3.125 MG tablet Take 1 tablet (3.125 mg total) by mouth 2 (two) times daily with a meal. (Patient not taking: Reported on 12/08/2015) 60 tablet 6  . lisinopril (PRINIVIL,ZESTRIL) 2.5 MG tablet Take 1 tablet (2.5 mg total) by mouth daily. (Patient not taking: Reported on 12/08/2015) 90 tablet 1   No facility-administered medications prior to visit.      Allergies:   Atorvastatin; Crestor; Ibuprofen; and Pravastatin sodium   Social History   Social History  . Marital Status: Married    Spouse Name: N/A  . Number of Children: 1  . Years of Education: N/A   Occupational History  . 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   Social History Main Topics  . Smoking status: Never Smoker   . Smokeless tobacco: Never Used  . Alcohol Use: No  . Drug Use: No  . Sexual Activity: No   Other Topics Concern  . None   Social History Narrative   Marital status: married x 31 years.  From [place]; to [place] 1984.      Children:  1 child (30); no grandchildren.      Lives: with husband.      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      Tobacco: never      Alcohol: none       Exercise: works in the garden.      Family History:  The patient's family history includes Alcohol abuse in  her father; Cancer (age of onset: 34) in her father; Diabetes in her sister and sister; Heart attack in her brother; Prostate cancer in her father. There is no history of Stroke.   ROS:   Please see the history of present illness.    ROS All other systems reviewed and are negative.   Physical Exam:    VS:  BP 140/64 mmHg  Pulse 73  Ht '4\' 9"'  (1.448 m)  Wt 102 lb 12 oz (46.607 kg)  BMI 22.23 kg/m2  SpO2 99%   Physical Exam  Constitutional: She is oriented to person, place, and time. She appears well-developed and well-nourished.  HENT:  Head: Normocephalic.  Neck: No JVD present.  Cardiovascular: Normal rate and regular rhythm.   Murmur heard.  Low-pitched systolic murmur is present with a grade of 2/6  at the lower left sternal border Pulmonary/Chest: Effort normal and breath sounds normal. She has no wheezes. She has no rales.  Abdominal: Soft. There is no tenderness.  Musculoskeletal: She exhibits no edema.  right wrist without hematoma or mass   Neurological: She is alert and oriented to person, place, and time.  Skin: Skin is warm and dry.  Psychiatric: She has a normal mood and affect.    Wt Readings from Last 3 Encounters:  12/08/15 102 lb 12 oz (46.607 kg)  12/01/15 103 lb 8 oz (46.947 kg)  03/28/15 98 lb 6.4 oz (44.634 kg)      Studies/Labs Reviewed:     EKG:  EKG is  ordered today.  The ekg ordered today demonstrates NSR, HR 73, normal axis, NSSTTW changes, QTc 412 ms  Recent Labs: 11/28/2015: TSH 2.029 11/29/2015: ALT 27 12/01/2015: BUN 10; Creatinine, Ser 0.85; Hemoglobin 11.3*; Platelets 387; Potassium 3.5; Sodium 138   Recent Lipid Panel    Component Value Date/Time   CHOL 229* 11/29/2015 0207   TRIG 96 11/29/2015 0207   HDL 56 11/29/2015 0207   CHOLHDL 4.1 11/29/2015 0207   VLDL 19 11/29/2015 0207   LDLCALC 154* 11/29/2015 0207    Additional studies/ records that were reviewed today include:   Echo 11/30/15 - Left ventricle: The cavity size was  normal. Systolic function was   normal. The estimated ejection fraction was in the range of 60%   to 65%. Mild hypokinesis of the anterior myocardium. Doppler   parameters are consistent with abnormal left ventricular   relaxation (grade 1 diastolic dysfunction). Doppler parameters   are consistent with indeterminate ventricular filling pressure. - Aortic valve: Transvalvular velocity was within the normal range.   There was no stenosis. There was no regurgitation. - Mitral valve: Transvalvular velocity was within the normal range.   There was no evidence for stenosis. There was trivial   regurgitation. - Right ventricle: The cavity size was normal. Wall thickness was   normal. Systolic function was normal. - Atrial septum: No defect or patent foramen ovale was identified   by color flow Doppler. - Tricuspid valve: There was mild regurgitation. - Pulmonary arteries: Systolic pressure was within the normal   range. PA peak pressure: 23 mm Hg (S).  LHC 11/30/15 LAD proximal 99% involving the proximal to mid stent, D1 ostial 90% (jailed by stent) LCx okay RCA proximal 40% EF >65% PCI: 3 x 24 mm Synergy DES to the proximal LAD and POBA to the diagonal branch 1. Severe restenosis proximal LAD stent with involvement of the moderate caliber Diagonal branch which is jailed by the old stent.  2. Successful PTCA/DES x 1 proximal LAD 3. Successful PTCA/angioplasty only Diagonal branch (jailed by old stent) 4. NSTEMI 5. Normal LV systolic function Recommendations: Will continue ASA and Plavix for one year if she tolerates. She is intolerant of statins.         Cardiac Cath 04/2011 LAD: Stent in the proximal vessel is widely patent.  RCA: prox 20%  EF 65%   Carotid US 01/2014 Bilateral ICA 40-59%  Normal subclavian arteries, bilaterally. FU 1 year  Renal Artery Duplex 11/15 Normal caliber abdominal aorta. Normal and symmetrical kidney size. Normal renal arteries, bilaterally. The  IVC and renal veins are patent.  ASSESSMENT:  1. NSTEMI (non-ST elevated myocardial infarction) ([place])   2. Coronary artery disease involving native coronary artery of native heart without angina pectoris   3. Essential hypertension   4. HLD (hyperlipidemia)   5. Bilateral carotid artery disease ([place])     PLAN:     In order of problems listed above:  1. S/p NSTEMI - She is status post non-STEMI treated with DES to the LAD in the previous stent and angioplasty to the D1. We have discussed the importance of dual antiplatelet therapy. She is agreeable take aspirin and Plavix daily. She understands the importance of intensive cholesterol management. She plans to start cardiac rehabilitation when she is contacted. She is not willing to try different beta blockers. She is willing to switch from lisinopril back to losartan.  -  Proceed with cardiac rehabilitation  -  Continue aspirin, Plavix, statin  -  Start losartan 25 mg daily. BMET 1 week.  2. CAD - Continue aspirin, statin, Plavix. As noted, she is not willing to take beta blocker therapy.  3. HTN - Blood pressure is not controlled.  4. HL- Continue atorvastatin 10 mg. Repeat lipids and LFTs in 6 weeks. If LDL >70, refer to lipid clinic for PCSK-9 therapy.  5. Carotid artery disease - No follow-up in 2 years. Arrange follow-up carotid Dopplers. Continue aspirin, statin.   Medication Adjustments/Labs and Tests Ordered: Current medicines are reviewed at length with the patient today.  Concerns regarding medicines are outlined above.  Medication changes, Labs and Tests ordered today are outlined in the Patient Instructions noted below. Patient Instructions  Medication Instructions:  1. START TAKING ASPIRIN 81 MG EVERY DAY 2. START LOSARTAN 25 MG DAILY; PRESCRIPTION SENT IN Labwork: 1. 12/16/15 BMET 2. 01/19/16 FASTING LIPID AND LIVER PANEL  Testing/Procedures: Your physician has requested that you have a carotid duplex. This test is  an ultrasound of the carotid arteries in your neck. It looks at blood flow through these arteries that supply the brain with blood. Allow one hour for this exam. There are no restrictions or special instructions. Follow-Up: SCOTT WEAVER, PAC IN 6 WEEKS SAME DAY DR. Irish Lack IS IN THE OFFICE  Any Other Special Instructions Will Be Listed Below (If Applicable). If you need a refill on your cardiac medications before your next appointment, please call your pharmacy.   Signed, Richardson Dopp, PA-C  12/08/2015 6:37 PM    [facility] [address], [place], [place]  [address] Phone: [phone]; Fax: [phone]

## 2015-12-08 ENCOUNTER — Ambulatory Visit (INDEPENDENT_AMBULATORY_CARE_PROVIDER_SITE_OTHER): Payer: Medicare Other | Admitting: Pharmacist

## 2015-12-08 ENCOUNTER — Telehealth: Payer: Self-pay | Admitting: Physician Assistant

## 2015-12-08 ENCOUNTER — Ambulatory Visit (INDEPENDENT_AMBULATORY_CARE_PROVIDER_SITE_OTHER): Payer: Medicare Other | Admitting: Physician Assistant

## 2015-12-08 ENCOUNTER — Encounter: Payer: Self-pay | Admitting: Physician Assistant

## 2015-12-08 VITALS — BP 140/64 | HR 73 | Ht <= 58 in | Wt 102.8 lb

## 2015-12-08 DIAGNOSIS — I2510 Atherosclerotic heart disease of native coronary artery without angina pectoris: Secondary | ICD-10-CM

## 2015-12-08 DIAGNOSIS — I779 Disorder of arteries and arterioles, unspecified: Secondary | ICD-10-CM

## 2015-12-08 DIAGNOSIS — I214 Non-ST elevation (NSTEMI) myocardial infarction: Secondary | ICD-10-CM | POA: Diagnosis not present

## 2015-12-08 DIAGNOSIS — I10 Essential (primary) hypertension: Secondary | ICD-10-CM

## 2015-12-08 DIAGNOSIS — E785 Hyperlipidemia, unspecified: Secondary | ICD-10-CM | POA: Diagnosis not present

## 2015-12-08 DIAGNOSIS — I739 Peripheral vascular disease, unspecified: Secondary | ICD-10-CM

## 2015-12-08 MED ORDER — ASPIRIN EC 81 MG PO TBEC: 81.0000 mg | DELAYED_RELEASE_TABLET | Freq: Every day | ORAL | Status: DC

## 2015-12-08 MED ORDER — LOSARTAN POTASSIUM 25 MG PO TABS: 25.0000 mg | ORAL_TABLET | Freq: Every day | ORAL | Status: DC

## 2015-12-08 NOTE — Progress Notes (Signed)
Patient ID: [name]                 DOB: 1946/05/28                      MRN: [id number]    Pharmacy Transitions of Care Visit  HPI: [name] is a 70 y.o. female who was admitted to [facility] from 6/17-6/20 with NSTEMI.  She presented to ER on 6/17 after having back pain and a heartburn sensation after walking that night.  PMH is significant for CAD (s/p DES to LAD in 2012), carotid stenosis, HTN, HLD, and GERD.  Her troponin continued to trend up after admission.  Cath showed 99% stenosis of the prox LAD and 90% stenosis of the diagonal branch.  DES was placed in the prox LAD and PTCA/angioplasty performed on the diagonal branch.  She had previously not been taking her beta blocker, statin or ASA because she prefers to take "natural remedies".  She agreed to start Coreg but only once per day, low dose atorvastatin, ASA and Plavix.   Patient presents to clinic for pharmacy transitions of care medication reconciliation after hospital discharge.  All medications have been reviewed with patient.  She stopped taking her carvedilol and lisinopril a few days ago.  She states she had severe stomach cramping and diarrhea with these.  She does not want to restart a beta blocker but wants to change lisinopril to losartan.  She denies any recurrent CP and has not required use of any NTG.   Reinforced importance of compliance with ASA and Plavix.    Past Medical History  Diagnosis Date  . GERD (gastroesophageal reflux disease)   . Hiatal hernia   . Arthritis   . HTN (hypertension)   . HLD (hyperlipidemia)   . CAD (coronary artery disease)     a. s/p NSTEMI 10/12: LHC 04/02/11: mLAD 95%, EF 60%; PCI: Promus DES to mLAD. b. LHC 04/29/11:  LAD stent patent, proximal RCA 25%, EF 65%  c. 11/2015 - NSTEMI: cath w/ 99% stenosis prox LAD (DES placed) and 90% stenosis D1 (jailed by old stent, POBA alone performed)  . Multinodular thyroid   . Lung nodule     a. Pt initially refused  further workup. b. Per pulm note, resolved 02/19/14 and never smoker so no further f/u needed.  [name] History of noncompliance with medical treatment   . Carotid stenosis 2015    a. 40-59% bilateral ICA stenosis, repeat 1 year.  [name] PVC's (premature ventricular contractions)   . HBV (hepatitis B virus) infection     Current Outpatient Prescriptions on File Prior to Visit  Medication Sig Dispense Refill  . aspirin EC 81 MG tablet Take 1 tablet (81 mg total) by mouth every other day. 90 tablet 3  . atorvastatin (LIPITOR) 10 MG tablet Take 1 tablet (10 mg total) by mouth daily at 6 PM. 30 tablet 6  . carvedilol (COREG) 3.125 MG tablet Take 1 tablet (3.125 mg total) by mouth 2 (two) times daily with a meal. 60 tablet 6  . clopidogrel (PLAVIX) 75 MG tablet Take 1 tablet (75 mg total) by mouth daily with breakfast. 30 tablet 11  . lisinopril (PRINIVIL,ZESTRIL) 2.5 MG tablet Take 1 tablet (2.5 mg total) by mouth daily. 90 tablet 1  . nitroGLYCERIN (NITROSTAT) 0.4 MG SL tablet Place 1 tablet (0.4 mg total) under the tongue every 5 (five) minutes as needed for chest pain. 25 tablet  3   No current facility-administered medications on file prior to visit.    Allergies  Allergen Reactions  . Atorvastatin Other (See Comments)    Muscle pain  . Crestor [Rosuvastatin] Other (See Comments)    Muscle pain  . Ibuprofen     REACTION: rapid heart rate  . Pravastatin Sodium Other (See Comments)    Muscle cramps/ confusion    Assessment/Plan:  1. Beta-blocker and ACE-I- Pt no longer taking either and only wants to restart losartan.  BP controlled today.  Suggest starting low dose losartan.  Discussed role of beta blocker but pt is adamant she does not want to take this.   2. Hyperlipidemia- She has been intolerant to Crestor 5 every other day, simvastatin 10mg  daily, Lipitor 40mg .  LDL was 154mg .  She is tolerating Lipitor 10mg  now.  She may need PCKS-9 inhibitor but she has [employer] so cost  will be a limiting factor.

## 2015-12-08 NOTE — Patient Instructions (Addendum)
Medication Instructions:  1. START TAKING ASPIRIN 81 MG EVERY DAY 2. START LOSARTAN 25 MG DAILY; PRESCRIPTION SENT IN Labwork: 1. 12/16/15 BMET 2. 01/19/16 FASTING LIPID AND LIVER PANEL  Testing/Procedures: Your physician has requested that you have a carotid duplex. This test is an ultrasound of the carotid arteries in your neck. It looks at blood flow through these arteries that supply the brain with blood. Allow one hour for this exam. There are no restrictions or special instructions. Follow-Up: SCOTT WEAVER, PAC IN 6 WEEKS SAME DAY DR. Irish Lack IS IN THE OFFICE  Any Other Special Instructions Will Be Listed Below (If Applicable). If you need a refill on your cardiac medications before your next appointment, please call your pharmacy.

## 2015-12-08 NOTE — Telephone Encounter (Signed)
New Message:    Pt was here earlier today,question about her medicine.

## 2015-12-08 NOTE — Telephone Encounter (Signed)
Losartan is ok to take Richardson Dopp, PA-C   12/08/2015 4:07 PM

## 2015-12-08 NOTE — Telephone Encounter (Signed)
I cb pt who states to me she is very concerned about new Rx Losartan because it is made in [place] and she is concerned that this medication may harm her. I reassured pt several times that Losartan is safe. It has also been documented in her chart where she has several times asking her PCP to change her from Lisinopril to Losartan. I again reassured pt Losartan is safe to take. Pt said ok.

## 2015-12-16 ENCOUNTER — Ambulatory Visit (HOSPITAL_COMMUNITY)
Admission: RE | Admit: 2015-12-16 | Discharge: 2015-12-16 | Disposition: A | Discharge status: Home / Self Care | Payer: Medicare Other | Source: Ambulatory Visit | Attending: Internal Medicine | Admitting: Internal Medicine

## 2015-12-16 ENCOUNTER — Other Ambulatory Visit (INDEPENDENT_AMBULATORY_CARE_PROVIDER_SITE_OTHER): Payer: Medicare Other | Admitting: *Deleted

## 2015-12-16 ENCOUNTER — Telehealth: Payer: Self-pay | Admitting: *Deleted

## 2015-12-16 DIAGNOSIS — E785 Hyperlipidemia, unspecified: Secondary | ICD-10-CM | POA: Diagnosis not present

## 2015-12-16 DIAGNOSIS — K219 Gastro-esophageal reflux disease without esophagitis: Secondary | ICD-10-CM | POA: Diagnosis not present

## 2015-12-16 DIAGNOSIS — I10 Essential (primary) hypertension: Secondary | ICD-10-CM | POA: Diagnosis not present

## 2015-12-16 DIAGNOSIS — I739 Peripheral vascular disease, unspecified: Secondary | ICD-10-CM

## 2015-12-16 DIAGNOSIS — I6523 Occlusion and stenosis of bilateral carotid arteries: Secondary | ICD-10-CM | POA: Insufficient documentation

## 2015-12-16 DIAGNOSIS — I2510 Atherosclerotic heart disease of native coronary artery without angina pectoris: Secondary | ICD-10-CM | POA: Insufficient documentation

## 2015-12-16 DIAGNOSIS — I779 Disorder of arteries and arterioles, unspecified: Secondary | ICD-10-CM | POA: Insufficient documentation

## 2015-12-16 LAB — BASIC METABOLIC PANEL
BUN: 19 mg/dL (ref 7–25)
CALCIUM: 8.8 mg/dL (ref 8.6–10.4)
CO2: 27 mmol/L (ref 20–31)
CREATININE: 0.88 mg/dL (ref 0.50–0.99)
Chloride: 104 mmol/L (ref 98–110)
Glucose, Bld: 104 mg/dL — ABNORMAL HIGH (ref 65–99)
Potassium: 4.2 mmol/L (ref 3.5–5.3)
Sodium: 140 mmol/L (ref 135–146)

## 2015-12-16 LAB — HEPATIC FUNCTION PANEL
ALK PHOS: 41 U/L (ref 33–130)
ALT: 19 U/L (ref 6–29)
AST: 17 U/L (ref 10–35)
Albumin: 4.3 g/dL (ref 3.6–5.1)
BILIRUBIN INDIRECT: 0.5 mg/dL (ref 0.2–1.2)
Bilirubin, Direct: 0.1 mg/dL (ref <=0.2)
TOTAL PROTEIN: 6.8 g/dL (ref 6.1–8.1)
Total Bilirubin: 0.6 mg/dL (ref 0.2–1.2)

## 2015-12-16 LAB — LIPID PANEL
CHOLESTEROL: 121 mg/dL — AB (ref 125–200)
HDL: 55 mg/dL (ref >=46)
LDL Cholesterol: 48 mg/dL (ref <130)
TRIGLYCERIDES: 92 mg/dL (ref <150)
Total CHOL/HDL Ratio: 2.2 Ratio (ref <=5.0)
VLDL: 18 mg/dL (ref <30)

## 2015-12-16 NOTE — Addendum Note (Signed)
Addended by: Eulis Foster on: 12/16/2015 08:59 AM   Modules accepted: Orders

## 2015-12-16 NOTE — Telephone Encounter (Signed)
Pt notified of lab results by phone with verbal understanding. Pt also advised cholesterol panel drawn today which was supposed to be done 8/10 when see's PA. I advised pt will have billing credit FLP/LFT.

## 2015-12-17 ENCOUNTER — Encounter: Payer: Self-pay | Admitting: Physician Assistant

## 2015-12-18 ENCOUNTER — Telehealth: Payer: Self-pay | Admitting: *Deleted

## 2015-12-18 DIAGNOSIS — I6523 Occlusion and stenosis of bilateral carotid arteries: Secondary | ICD-10-CM

## 2015-12-18 DIAGNOSIS — I493 Ventricular premature depolarization: Secondary | ICD-10-CM

## 2015-12-18 NOTE — Telephone Encounter (Signed)
Lvm with husband who states pt is not home right now. I saw DPR on file for husband and asked if he would like for me to go over results with him. He said no he thought it was best to let pt know directly. I said no problem and ask ptcb for results.

## 2015-12-21 NOTE — Telephone Encounter (Signed)
VM not set up on cell #. Home # recording states person is unavailable. I cb to go over Caroitd [place].

## 2015-12-25 NOTE — Telephone Encounter (Signed)
Pt notified of Cartoid [place] results and findings by phone with verbal understanding. Repeat Carotids in 1 yr.

## 2016-01-19 ENCOUNTER — Other Ambulatory Visit: Payer: Medicare Other

## 2016-01-20 ENCOUNTER — Encounter: Payer: Self-pay | Admitting: Physician Assistant

## 2016-01-20 NOTE — Progress Notes (Signed)
Cardiology Office Note:    Date:  01/21/2016   ID:  [name], DOB [birth year], MRN [id number]  PCP:  Leandrew Koyanagi, MD  Cardiologist:  Dr. Loralie Champagne  Electrophysiologist:  n/a  Referring MD: Leandrew Koyanagi, MD   Chief Complaint  Patient presents with  . Follow-up    CAD    History of Present Illness:    [name] is a 70 y.o. female with a hx of CAD s/p NSTEMI 04/2011 tx with DES to mid LAD, carotid stenosis, HTN, HL, GERD, PAC/PVCs. At the time of her MI, she was reluctant to start most medications. She has refused multiple medications in the past due to SEs/intolerances (beta blockers, statins, ASA, Diltiazem, Losartan). Relook heart catheterization in 11/12 done for continued chest pain and fatigue: LAD stent patent, proximal RCA 25%, EF 65%.  Admitted in 6/17 with a non-STEMI.  LHC demonstrated 99% proximal LAD stenosis involving the previous stent and 90% stenosis in the diagonal branch jailed by the previous stent. She underwent PCI with DES to LAD and angioplasty to the diagonal. I saw her in FU 12/08/15.  She stopped her beta-blocker and ACE inhibitor due to SEs.  I had her resume Losartan 25 mg QD. She returns for FU.   Since last seen, she has been doing well. She denies chest discomfort. She's walking on a daily basis. She denies dyspnea. She denies orthopnea, PND or edema. She denies syncope. She has not heard from cardiac rehabilitation yet. She checks her blood pressure at home on a regular basis. Her BP typically runs in the [id number] systolic.  Prior CV studies that were reviewed today include:     Carotid US 7/17:  Stable 40-59% bilateral ICA stenosis. FU 1 year  Echo 11/30/15 EF 60-65%, mild anterior HK, grade 1 diastolic dysfunction, PASP 23 mmHg  LHC 11/30/15 LAD proximal 99% involving the proximal to mid stent, D1 ostial 90% (jailed by stent) LCx okay RCA proximal 40% EF >65% PCI: 3 x 24 mm Synergy DES to the proximal LAD and  POBA to the diagonal branch 1. Severe restenosis proximal LAD stent with involvement of the moderate caliber Diagonal branch which is jailed by the old stent.  2. Successful PTCA/DES x 1 proximal LAD 3. Successful PTCA/angioplasty only Diagonal branch (jailed by old stent) 4. NSTEMI 5. Normal LV systolic function Recommendations: Will continue ASA and Plavix for one year if she tolerates. She is intolerant of statins.   Cardiac Cath 04/2011 LAD: Stent in the proximal vessel is widely patent.  RCA: prox 20%  EF 65%   Carotid US 01/2014 Bilateral ICA 40-59%  Normal subclavian arteries, bilaterally. FU 1 year  Renal Artery Duplex 11/15 Normal caliber abdominal aorta. Normal and symmetrical kidney size. Normal renal arteries, bilaterally. The IVC and renal veins are patent.  Past Medical History:  Diagnosis Date  . Arthritis   . CAD (coronary artery disease)    a. s/p NSTEMI 10/12: LHC 04/02/11: mLAD 95%, EF 60%; PCI: Promus DES to mLAD. b. LHC 04/29/11:  LAD stent patent, proximal RCA 25%, EF 65%  c. 11/2015 - NSTEMI: cath w/ 99% stenosis prox LAD (DES placed) and 90% stenosis D1 (jailed by old stent, POBA alone performed), pRCA 40, EF > 65%  . Carotid stenosis 2015   a. Carotid US 8/15: 40-59% bilateral ICA stenosis  //   b. Carotid US 7/17: Stable 40-59% bilateral ICA stenosis. FU 1 year  . GERD (gastroesophageal reflux disease)   .  HBV (hepatitis B virus) infection   . Hiatal hernia   . History of echocardiogram    a. Echo 6/17:  EF 60-65%, mild anterior HK, grade 1 diastolic dysfunction, mild TR, PASP 23 mmHg  . History of noncompliance with medical treatment   . HLD (hyperlipidemia)   . HTN (hypertension)    a. RA duplex 11/15: norma caliber aorta, norma RA bilaterally   . Lung nodule    a. Pt initially refused further workup. b. Per pulm note, resolved 02/19/14 and never smoker so no further f/u needed.  . Multinodular thyroid   . PVC's (premature ventricular  contractions)     Past Surgical History:  Procedure Laterality Date  . BIOPSY THYROID     "negative"  . CARDIAC CATHETERIZATION N/A 11/30/2015   Procedure: Left Heart Cath and Coronary Angiography;  Surgeon: Burnell Blanks, MD;  Location: [facility];  Service: Cardiovascular;  Laterality: N/A;  . CESAREAN SECTION  1985  . CORONARY ANGIOPLASTY WITH STENT PLACEMENT  2012; 11/30/2015  . EXCISIONAL HEMORRHOIDECTOMY  1999  . LIPOMA EXCISION Right ~ 2015   "popliteal"  . TONSILLECTOMY      Current Medications: Outpatient Medications Prior to Visit  Medication Sig Dispense Refill  . aspirin EC 81 MG tablet Take 1 tablet (81 mg total) by mouth daily.    [name] atorvastatin (LIPITOR) 10 MG tablet Take 1 tablet (10 mg total) by mouth daily at 6 PM. 30 tablet 6  . clopidogrel (PLAVIX) 75 MG tablet Take 1 tablet (75 mg total) by mouth daily with breakfast. 30 tablet 11  . losartan (COZAAR) 25 MG tablet Take 1 tablet (25 mg total) by mouth daily. 90 tablet 3  . nitroGLYCERIN (NITROSTAT) 0.4 MG SL tablet Place 1 tablet (0.4 mg total) under the tongue every 5 (five) minutes as needed for chest pain. 25 tablet 3   No facility-administered medications prior to visit.       Allergies:   Atorvastatin; Crestor [rosuvastatin]; Ibuprofen; and Pravastatin sodium   Social History   Social History  . Marital status: Married    Spouse name: N/A  . Number of children: 1  . Years of education: N/A   Occupational History  . 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   Social History Main Topics  . Smoking status: Never Smoker  . Smokeless tobacco: Never Used  . Alcohol use No  . Drug use: No  . Sexual activity: No   Other Topics Concern  . None   Social History Narrative   Marital status: married x 31 years.  From [place]; to [place] 1984.      Children:  1 child (30); no grandchildren.      Lives: with husband.      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       Tobacco: never      Alcohol: none       Exercise: works in the garden.      Family History:  The patient's family history includes Alcohol abuse in her father; Cancer (age of onset: 89) in her father; Diabetes in her sister and sister; Heart attack in her brother; Prostate cancer in her father.   ROS:   Please see the history of present illness.    Review of Systems  Hematologic/Lymphatic: Bruises/bleeds easily.   All other systems reviewed and are negative.   EKGs/Labs/Other Test Reviewed:    EKG:  EKG is  ordered today.  The ekg  ordered today demonstrates NSR, HR 63, normal axis, QTC 429 ms, no significant change compared to prior tracing dated 12/08/15  Recent Labs: 11/28/2015: TSH 2.029 12/01/2015: Hemoglobin 11.3; Platelets 387 12/16/2015: ALT 19; BUN 19; Creat 0.88; Potassium 4.2; Sodium 140   Recent Lipid Panel    Component Value Date/Time   CHOL 121 (L) 12/16/2015 0908   TRIG 92 12/16/2015 0908   HDL 55 12/16/2015 0908   CHOLHDL 2.2 12/16/2015 0908   VLDL 18 12/16/2015 0908   LDLCALC 48 12/16/2015 0908     Physical Exam:    VS:  BP (!) 160/60   Pulse 64   Ht 4\' 9"  (1.448 m)   Wt 100 lb 12.8 oz (45.7 kg)   BMI 21.81 kg/m     Wt Readings from Last 3 Encounters:  01/21/16 100 lb 12.8 oz (45.7 kg)  12/08/15 102 lb 12 oz (46.6 kg)  12/01/15 103 lb 8 oz (46.9 kg)     Physical Exam  Constitutional: She is oriented to person, place, and time. She appears well-developed and well-nourished.  HENT:  Head: Normocephalic and atraumatic.  Eyes: No scleral icterus.  Neck: Normal range of motion. No JVD present.  Cardiovascular: Normal rate, regular rhythm, S1 normal and S2 normal.  Exam reveals no gallop and no friction rub.   No murmur heard. Pulmonary/Chest: Breath sounds normal. She has no wheezes. She has no rhonchi. She has no rales.  Abdominal: Soft. There is no tenderness.  Musculoskeletal: She exhibits no edema.  Neurological: She is alert and oriented to  person, place, and time.  Skin: Skin is warm and dry.  Psychiatric: She has a normal mood and affect.    ASSESSMENT:    1. Coronary artery disease involving native coronary artery of native heart without angina pectoris   2. Essential hypertension   3. Hyperlipidemia   4. Bilateral carotid artery disease ([place])    PLAN:    In order of problems listed above:  1. CAD - S/p NSTEMI in 6/17 tx with DES to the LAD in the previous stent and angioplasty to the D1. She is doing well without angina. Continue aspirin, Plavix, losartan. She is intolerant to beta blockers. Continue low-dose statin. She is still interested in cardiac rehabilitation. We will arrange referral.  2. HTN - Blood pressure elevated today. She has not taken any medications. Her blood pressure typically runs normal at home. I have asked her to track her blood pressures for 2 weeks and send me the readings.  3. HL - Continue atorvastatin 10 mg.  Obtain lipids and LFTs today. Refer to lipid clinic if LDL not at goal.  4. Carotid artery disease - Repeat Doppler US recently noted to be stable. Follow-up due in 1 year.  Continue aspirin, statin.  Medication Adjustments/Labs and Tests Ordered: Current medicines are reviewed at length with the patient today.  Concerns regarding medicines are outlined above.  Medication changes, Labs and Tests ordered today are outlined in the Patient Instructions noted below. Patient Instructions  Medication Instructions:  Your physician recommends that you continue on your current medications as directed. Please refer to the Current Medication list given to you today. Labwork: Your physician recommends that you have lab work today- Lipid and liver panel Testing/Procedures: NONE Follow-Up: Your physician wants you to follow-up in: 4 months with Richardson Dopp PA. You will receive a reminder letter in the mail two months in advance. If you don't receive a letter, please call our office to schedule  the follow-up appointment. Your physician recommends that you schedule a follow-up appointment with Cardiac Rehab.  Please check your blood pressure once daily for two weeks and inform our office of your results. Please call [phone] and ask for Arbie Cookey.  If you need a refill on your cardiac medications before your next appointment, please call your pharmacy.  Signed, Richardson Dopp, PA-C  01/21/2016 8:43 AM    [facility] [place], [place], [place]  [address] Phone: [phone]; Fax: [phone]

## 2016-01-21 ENCOUNTER — Ambulatory Visit (INDEPENDENT_AMBULATORY_CARE_PROVIDER_SITE_OTHER): Payer: Medicare Other | Admitting: Physician Assistant

## 2016-01-21 ENCOUNTER — Encounter (INDEPENDENT_AMBULATORY_CARE_PROVIDER_SITE_OTHER): Payer: Self-pay

## 2016-01-21 ENCOUNTER — Other Ambulatory Visit: Payer: Medicare Other

## 2016-01-21 ENCOUNTER — Encounter: Payer: Self-pay | Admitting: Physician Assistant

## 2016-01-21 VITALS — BP 160/60 | HR 64 | Ht <= 58 in | Wt 100.8 lb

## 2016-01-21 DIAGNOSIS — I779 Disorder of arteries and arterioles, unspecified: Secondary | ICD-10-CM

## 2016-01-21 DIAGNOSIS — I10 Essential (primary) hypertension: Secondary | ICD-10-CM

## 2016-01-21 DIAGNOSIS — E785 Hyperlipidemia, unspecified: Secondary | ICD-10-CM

## 2016-01-21 DIAGNOSIS — I2510 Atherosclerotic heart disease of native coronary artery without angina pectoris: Secondary | ICD-10-CM | POA: Diagnosis not present

## 2016-01-21 DIAGNOSIS — I739 Peripheral vascular disease, unspecified: Secondary | ICD-10-CM

## 2016-01-21 LAB — HEPATIC FUNCTION PANEL
ALBUMIN: 4.5 g/dL (ref 3.6–5.1)
ALT: 112 U/L — AB (ref 6–29)
AST: 80 U/L — AB (ref 10–35)
Alkaline Phosphatase: 57 U/L (ref 33–130)
Bilirubin, Direct: 0.1 mg/dL (ref <=0.2)
Indirect Bilirubin: 0.5 mg/dL (ref 0.2–1.2)
Total Bilirubin: 0.6 mg/dL (ref 0.2–1.2)
Total Protein: 7.5 g/dL (ref 6.1–8.1)

## 2016-01-21 LAB — LIPID PANEL
CHOL/HDL RATIO: 3 ratio (ref <=5.0)
CHOLESTEROL: 168 mg/dL (ref 125–200)
HDL: 56 mg/dL (ref >=46)
LDL Cholesterol: 91 mg/dL (ref <130)
Triglycerides: 106 mg/dL (ref <150)
VLDL: 21 mg/dL (ref <30)

## 2016-01-21 NOTE — Patient Instructions (Addendum)
Medication Instructions:  Your physician recommends that you continue on your current medications as directed. Please refer to the Current Medication list given to you today. Labwork: Your physician recommends that you have lab work today- Lipid and liver panel Testing/Procedures: NONE Follow-Up: Your physician wants you to follow-up in: 4 months with Richardson Dopp PA. You will receive a reminder letter in the mail two months in advance. If you don't receive a letter, please call our office to schedule the follow-up appointment. Your physician recommends that you schedule a follow-up appointment with Cardiac Rehab.  Please check your blood pressure once daily for two weeks and inform our office of your results. Please call [phone] and ask for Arbie Cookey.  If you need a refill on your cardiac medications before your next appointment, please call your pharmacy.

## 2016-01-21 NOTE — Addendum Note (Signed)
Addended by: Eulis Foster on: 01/21/2016 08:55 AM   Modules accepted: Orders

## 2016-01-21 NOTE — Addendum Note (Signed)
Addended by: Aris Georgia, Sury Wentworth L on: 01/21/2016 08:54 AM   Modules accepted: Orders

## 2016-01-22 ENCOUNTER — Telehealth: Payer: Self-pay | Admitting: *Deleted

## 2016-01-22 ENCOUNTER — Other Ambulatory Visit: Payer: Self-pay | Admitting: *Deleted

## 2016-01-22 DIAGNOSIS — R7989 Other specified abnormal findings of blood chemistry: Secondary | ICD-10-CM

## 2016-01-22 DIAGNOSIS — R945 Abnormal results of liver function studies: Principal | ICD-10-CM

## 2016-01-22 NOTE — Telephone Encounter (Signed)
Pt notified of lab results and findings by phone with verbal understanding to plan of care. Pt agreeable to labs to be done : LFT, GGT, HEPATITIS PANEL and COMPLETE ABD [place]. Pt aware [place] to call and schedule these test. I will call PCP for appt for pt.

## 2016-01-27 ENCOUNTER — Other Ambulatory Visit: Payer: Medicare Other

## 2016-01-27 ENCOUNTER — Ambulatory Visit (HOSPITAL_COMMUNITY)
Admission: RE | Admit: 2016-01-27 | Discharge: 2016-01-27 | Disposition: A | Discharge status: Home / Self Care | Payer: Medicare Other | Source: Ambulatory Visit | Attending: Physician Assistant | Admitting: Physician Assistant

## 2016-01-27 DIAGNOSIS — R7989 Other specified abnormal findings of blood chemistry: Secondary | ICD-10-CM | POA: Insufficient documentation

## 2016-01-27 DIAGNOSIS — R945 Abnormal results of liver function studies: Secondary | ICD-10-CM | POA: Diagnosis not present

## 2016-01-27 DIAGNOSIS — R140 Abdominal distension (gaseous): Secondary | ICD-10-CM | POA: Insufficient documentation

## 2016-01-27 DIAGNOSIS — K838 Other specified diseases of biliary tract: Secondary | ICD-10-CM | POA: Diagnosis not present

## 2016-01-28 ENCOUNTER — Ambulatory Visit (INDEPENDENT_AMBULATORY_CARE_PROVIDER_SITE_OTHER): Payer: Medicare Other | Admitting: Physician Assistant

## 2016-01-28 ENCOUNTER — Telehealth: Payer: Self-pay | Admitting: *Deleted

## 2016-01-28 DIAGNOSIS — R945 Abnormal results of liver function studies: Principal | ICD-10-CM

## 2016-01-28 DIAGNOSIS — R7989 Other specified abnormal findings of blood chemistry: Secondary | ICD-10-CM | POA: Diagnosis not present

## 2016-01-28 LAB — HEPATIC FUNCTION PANEL
ALT: 105 U/L — ABNORMAL HIGH (ref 6–29)
AST: 65 U/L — ABNORMAL HIGH (ref 10–35)
Albumin: 4.6 g/dL (ref 3.6–5.1)
Alkaline Phosphatase: 56 U/L (ref 33–130)
BILIRUBIN DIRECT: 0.1 mg/dL (ref <=0.2)
BILIRUBIN INDIRECT: 0.6 mg/dL (ref 0.2–1.2)
BILIRUBIN TOTAL: 0.7 mg/dL (ref 0.2–1.2)
TOTAL PROTEIN: 7.6 g/dL (ref 6.1–8.1)

## 2016-01-28 LAB — GAMMA GT: GGT: 15 U/L (ref 7–51)

## 2016-01-28 NOTE — Patient Instructions (Addendum)
Follow up with [name]. He will receive your lab results from this visit. Thank you for letting me participate in your health and well being. Have a great     IF you received an x-ray today, you will receive an invoice from [facility]. Please contact [facility] at [phone] with questions or concerns regarding your invoice.   IF you received labwork today, you will receive an invoice from [employer]. Please contact [facility] at [phone] with questions or concerns regarding your invoice.   Our billing staff will not be able to assist you with questions regarding bills from these companies.  You will be contacted with the lab results as soon as they are available. The fastest way to get your results is to activate your My Chart account. Instructions are located on the last page of this paperwork. If you have not heard from [place] regarding the results in 2 weeks, please contact this office.

## 2016-01-28 NOTE — Progress Notes (Signed)
   [name]  MRN: [id number] DOB: 10/31/1945  Subjective:  [name] is a 69 y.o. female seen in office today for a chief complaint of lab work. States her cardiologist has put in her orders for further evaluation of elevated liver enzymes.   Pt is closely followed by a cardiology PA-C, Richardson Dopp. Our office contacted [facility] and they have put in the order for [place] to collect.    Of note, pt has not taken BP medication this morning but her BP typically runs in [id number] systolic. States it is always elevated in the [id number] systolic when she comes to the doctor's office.   Pt has no other complaints or questions today.   Review of Systems Per HPI  Patient Active Problem List   Diagnosis Date Noted  . Status post coronary artery stent placement   . NSTEMI (non-ST elevated myocardial infarction) ([place]) 11/28/2015  . PVC's (premature ventricular contractions)   . Carotid stenosis   . Upper airway cough syndrome 02/19/2014  . Lung nodule, solitary 06/01/2013  . Elevated LFTs 09/11/2011  . CAD (coronary artery disease)   . Chest pain 09/15/2010  . Hyperlipidemia 09/15/2010  . Hypertension 09/15/2010    Current Outpatient Prescriptions on File Prior to Visit  Medication Sig Dispense Refill  . aspirin EC 81 MG tablet Take 1 tablet (81 mg total) by mouth daily.    . clopidogrel (PLAVIX) 75 MG tablet Take 1 tablet (75 mg total) by mouth daily with breakfast. 30 tablet 11  . losartan (COZAAR) 25 MG tablet Take 1 tablet (25 mg total) by mouth daily. 90 tablet 3  . nitroGLYCERIN (NITROSTAT) 0.4 MG SL tablet Place 1 tablet (0.4 mg total) under the tongue every 5 (five) minutes as needed for chest pain. 25 tablet 3  . atorvastatin (LIPITOR) 10 MG tablet Take 1 tablet (10 mg total) by mouth daily at 6 PM. (Patient not taking: Reported on 01/28/2016) 30 tablet 6   No current facility-administered medications on file prior to visit.     Allergies  Allergen Reactions  .  Atorvastatin Other (See Comments)    Muscle pain  . Crestor [Rosuvastatin] Other (See Comments)    Muscle pain  . Ibuprofen     REACTION: rapid heart rate  . Pravastatin Sodium Other (See Comments)    Muscle cramps/ confusion    Objective:  BP (!) 162/78 (BP Location: Left Arm, Patient Position: Sitting, Cuff Size: Normal)   Pulse 73   Temp 98.1 F (36.7 C) (Oral)   Resp 17   Ht 4\' 9"  (1.448 m)   Wt 100 lb (45.4 kg)   SpO2 100%   BMI 21.64 kg/m   Physical Exam  Constitutional: She is oriented to person, place, and time and well-developed, well-nourished, and in no distress.  HENT:  Head: Normocephalic and atraumatic.  Eyes: Conjunctivae are normal.  Neck: Normal range of motion.  Pulmonary/Chest: Effort normal.  Neurological: She is alert and oriented to person, place, and time. Gait normal.  Skin: Skin is warm and dry.  Psychiatric: Affect normal.  Vitals reviewed.   Assessment and Plan :   1. Elevated LFTs - Gamma GT - Hepatic function panel - Hepatitis panel, acute -Will follow up with Richardson Dopp, PA-C at [facility] for results    Tenna Delaine PA-C  [facility] 01/28/2016 8:28 AM

## 2016-01-28 NOTE — Telephone Encounter (Signed)
Pt has been notified Abdominal US, no gallstones, though common bile duct dilated. Asked pt had she seen her PCP yet, she said today. I asked her if she would like [place] results sent to PCP, pt said no.

## 2016-01-29 ENCOUNTER — Telehealth: Payer: Self-pay | Admitting: *Deleted

## 2016-01-29 LAB — HEPATITIS PANEL, ACUTE
HCV AB: NEGATIVE
HEP A IGM: NONREACTIVE
HEP B C IGM: NONREACTIVE
Hepatitis B Surface Ag: POSITIVE — AB

## 2016-01-29 LAB — HEPATITIS B SURF AG CONFIRMATION: Hepatitis B Surf Ag Confirmation: POSITIVE — AB

## 2016-01-29 NOTE — Telephone Encounter (Signed)
Pt advised of lab results. Pt was advised numerous times during my call with her that she needs to see PCP due to abnormal abdominal US and lab work. Pt told me yesterday she went to PCP, however I find out that it was only to get the lab work done. Pt states she feels fine and she is not going to go to see her PCP. I advised pt that is her choice however; the cardiologist are advising she see PCP due to abnormal test. Pt again kept saying she is fine and she is not going to PCP.

## 2016-02-01 ENCOUNTER — Telehealth: Payer: Self-pay

## 2016-02-01 NOTE — Telephone Encounter (Signed)
Pt wants to pick up a copy of her lab results from the 17th.  Call her at [phone] when ready to pick up.

## 2016-02-02 NOTE — Telephone Encounter (Signed)
Spoke with pt and letter will be mailed to her

## 2016-05-23 ENCOUNTER — Encounter: Payer: Self-pay | Admitting: Physician Assistant

## 2016-05-23 ENCOUNTER — Ambulatory Visit (INDEPENDENT_AMBULATORY_CARE_PROVIDER_SITE_OTHER): Payer: Medicare Other | Admitting: Physician Assistant

## 2016-05-23 ENCOUNTER — Encounter (INDEPENDENT_AMBULATORY_CARE_PROVIDER_SITE_OTHER): Payer: Self-pay

## 2016-05-23 VITALS — BP 138/60 | HR 68 | Ht <= 58 in | Wt 97.8 lb

## 2016-05-23 DIAGNOSIS — I10 Essential (primary) hypertension: Secondary | ICD-10-CM | POA: Diagnosis not present

## 2016-05-23 DIAGNOSIS — I2510 Atherosclerotic heart disease of native coronary artery without angina pectoris: Secondary | ICD-10-CM | POA: Diagnosis not present

## 2016-05-23 DIAGNOSIS — I779 Disorder of arteries and arterioles, unspecified: Secondary | ICD-10-CM | POA: Diagnosis not present

## 2016-05-23 DIAGNOSIS — R7989 Other specified abnormal findings of blood chemistry: Secondary | ICD-10-CM | POA: Diagnosis not present

## 2016-05-23 DIAGNOSIS — R945 Abnormal results of liver function studies: Secondary | ICD-10-CM

## 2016-05-23 DIAGNOSIS — I739 Peripheral vascular disease, unspecified: Secondary | ICD-10-CM

## 2016-05-23 DIAGNOSIS — E7800 Pure hypercholesterolemia, unspecified: Secondary | ICD-10-CM | POA: Diagnosis not present

## 2016-05-23 LAB — LIPID PANEL
CHOLESTEROL: 177 mg/dL (ref <200)
HDL: 40 mg/dL — AB (ref >50)
LDL Cholesterol: 95 mg/dL (ref <100)
TRIGLYCERIDES: 210 mg/dL — AB (ref <150)
Total CHOL/HDL Ratio: 4.4 Ratio (ref <5.0)
VLDL: 42 mg/dL — AB (ref <30)

## 2016-05-23 LAB — HEPATIC FUNCTION PANEL
ALK PHOS: 51 U/L (ref 33–130)
ALT: 36 U/L — AB (ref 6–29)
AST: 37 U/L — ABNORMAL HIGH (ref 10–35)
Albumin: 4.6 g/dL (ref 3.6–5.1)
BILIRUBIN INDIRECT: 0.4 mg/dL (ref 0.2–1.2)
Bilirubin, Direct: 0.1 mg/dL (ref <=0.2)
TOTAL PROTEIN: 7 g/dL (ref 6.1–8.1)
Total Bilirubin: 0.5 mg/dL (ref 0.2–1.2)

## 2016-05-23 NOTE — Patient Instructions (Addendum)
Medication Instructions:  Your physician recommends that you continue on your current medications as directed. Please refer to the Current Medication list given to you today.  Labwork: TODAY FASTING LIPID AND LIVER PANEL  Testing/Procedures: Your physician has requested that you have a carotid duplex. This test is an ultrasound of the carotid arteries in your neck. It looks at blood flow through these arteries that supply the brain with blood. Allow one hour for this exam. There are no restrictions or special instructions. THIS IS TO BE DONE 12/2016  Follow-Up: SCOTT WEAVER, PAC 6 MONTHS  A REFERRAL TO GASTROENTEROLOGY HAS BEEN PLACED   Any Other Special Instructions Will Be Listed Below (If Applicable).  If you need a refill on your cardiac medications before your next appointment, please call your pharmacy.

## 2016-05-23 NOTE — Progress Notes (Signed)
Cardiology Office Note:    Date:  05/23/2016   ID:  [name], DOB Oct 19, 1945, MRN [id number]  PCP:  [name] Koyanagi, MD (Inactive)  Cardiologist:  Dr. Loralie [name]   Electrophysiologist:  n/a  Referring MD: [name] Koyanagi, MD   Chief Complaint  Patient presents with  . Follow-up    CAD    History of Present Illness:    [name] is a 70 y.o. female with a hx of CAD s/p NSTEMI 04/2011 tx with DES to mid LAD, carotid stenosis, HTN, HL, GERD, PAC/PVCs. At the time of her MI, she was reluctant to start most medications. She has refused multiple medications in the past due to SEs/intolerances (beta blockers, statins, ASA, Diltiazem, Losartan). Relook heart catheterization in 11/12 done for continued chest pain and fatigue: LAD stent patent, proximal RCA 25%, EF 65%.  Admitted in 6/17 with a non-STEMI.  LHC demonstrated 99% proximal LAD stenosis involving the previous stent and 90% stenosis in the diagonal branch jailed by the previous stent. She underwent PCI with DES to LAD and angioplasty to the diagonal.  After her myocardial infarction, she stopped her beta-blocker and ACE inhibitor due to SEs.  I had her resume Losartan 25 mg QD.    Last seen 8/17.  Follow-up lab work demonstrated elevated LFTs. Hepatitis serologies and repeat LFTs as well as abdominal ultrasound were obtained with plans for the patient to follow-up with primary care for further evaluation and management. Her ultrasound did demonstrate a dilated common bile duct. Hepatitis serologies demonstrated continued seropositivity for hepatitis B surface antigen.  Patient was seen by primary care on August 17.  No further recommendations were made and she was asked to follow-up here for her elevated LFTs.   She returns for Cardiology follow up.  She is here alone. She denies chest discomfort, shortness of breath, orthopnea, PND or edema. She denies syncope. She exercises on her own on a daily basis  without difficulty. She denies abdominal pain nausea or vomiting.   Prior CV studies that were reviewed today include:    Carotid US 7/17:  Stable 40-59% bilateral ICA stenosis. FU 1 year  Echo 11/30/15 EF 60-65%, mild anterior HK, grade 1 diastolic dysfunction, PASP 23 mmHg  LHC 11/30/15 LAD proximal 99% involving the proximal to mid stent, D1 ostial 90% (jailed by stent) LCx okay RCA proximal 40% EF >65% PCI: 3 x 24 mm Synergy DES to the proximal LAD and POBA to the diagonal branch 1. Severe restenosis proximal LAD stent with involvement of the moderate caliber Diagonal branch which is jailed by the old stent.  2. Successful PTCA/DES x 1 proximal LAD 3. Successful PTCA/angioplasty only Diagonal branch (jailed by old stent) 4. NSTEMI 5. Normal LV systolic function Recommendations: Will continue ASA and Plavix for one year if she tolerates. She is intolerant of statins.   Cardiac Cath 04/2011 LAD: Stent in the proximal vessel is widely patent.  RCA: prox 20%  EF 65%   Carotid US 01/2014 Bilateral ICA 40-59%  Normal subclavian arteries, bilaterally. FU 1 year  Renal Artery Duplex 11/15 Normal caliber abdominal aorta. Normal and symmetrical kidney size. Normal renal arteries, bilaterally. The IVC and renal veins are patent.  Past Medical History:  Diagnosis Date  . Arthritis   . CAD (coronary artery disease)    a. s/p NSTEMI 10/12: LHC 04/02/11: mLAD 95%, EF 60%; PCI: Promus DES to mLAD. b. LHC 04/29/11:  LAD stent patent, proximal RCA 25%, EF  65%  c. 11/2015 - NSTEMI: cath w/ 99% stenosis prox LAD (DES placed) and 90% stenosis D1 (jailed by old stent, POBA alone performed), pRCA 40, EF > 65%  . Carotid stenosis 2015   a. Carotid US 8/15: 40-59% bilateral ICA stenosis  //   b. Carotid US 7/17: Stable 40-59% bilateral ICA stenosis. FU 1 year  . GERD (gastroesophageal reflux disease)   . HBV (hepatitis B virus) infection   . Hiatal hernia   . History of echocardiogram     a. Echo 6/17:  EF 60-65%, mild anterior HK, grade 1 diastolic dysfunction, mild TR, PASP 23 mmHg  . History of noncompliance with medical treatment   . HLD (hyperlipidemia)   . HTN (hypertension)    a. RA duplex 11/15: norma caliber aorta, norma RA bilaterally   . Lung nodule    a. Pt initially refused further workup. b. Per pulm note, resolved 02/19/14 and never smoker so no further f/u needed.  . Multinodular thyroid   . PVC's (premature ventricular contractions)     Past Surgical History:  Procedure Laterality Date  . BIOPSY THYROID     "negative"  . CARDIAC CATHETERIZATION N/A 11/30/2015   Procedure: Left Heart Cath and Coronary Angiography;  Surgeon: [name] Blanks, MD;  Location: [facility];  Service: Cardiovascular;  Laterality: N/A;  . CESAREAN SECTION  1985  . CORONARY ANGIOPLASTY WITH STENT PLACEMENT  2012; 11/30/2015  . EXCISIONAL HEMORRHOIDECTOMY  1999  . LIPOMA EXCISION Right ~ 2015   "popliteal"  . TONSILLECTOMY      Current Medications: Current Meds  Medication Sig  . aspirin EC 81 MG tablet Take 1 tablet (81 mg total) by mouth daily.  . clopidogrel (PLAVIX) 75 MG tablet Take 1 tablet (75 mg total) by mouth daily with breakfast.  . losartan (COZAAR) 25 MG tablet Take 1 tablet (25 mg total) by mouth daily.  . nitroGLYCERIN (NITROSTAT) 0.4 MG SL tablet Place 1 tablet (0.4 mg total) under the tongue every 5 (five) minutes as needed for chest pain.     Allergies:   Atorvastatin; Crestor [rosuvastatin]; Ibuprofen; and Pravastatin sodium   Social History   Social History  . Marital status: Married    Spouse name: N/A  . Number of children: 1  . Years of education: N/A   Occupational History  . 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   Social History Main Topics  . Smoking status: Never Smoker  . Smokeless tobacco: Never Used  . Alcohol use No  . Drug use: No  . Sexual activity: No   Other Topics Concern  . None   Social  History Narrative   Marital status: married x 31 years.  From [place]; to [place] 1984.      Children:  1 child (30); no grandchildren.      Lives: with husband.      Employment: retired age 37 from [employer], educational.      Tobacco: never      Alcohol: none       Exercise: works in the garden.      Family History:  The patient's family history includes Alcohol abuse in her father; Cancer (age of onset: 74) in her father; Diabetes in her sister and sister; Heart attack in her brother; Prostate cancer in her father.   ROS:   Please see the history of present illness.    ROS All other systems reviewed and are negative.   EKGs/Labs/Other Test  Reviewed:    EKG:  EKG is  ordered today.  The ekg ordered today demonstrates NSR, HR 68, normal axis, nonspecific ST-T wave changes, QTc 393 ms, no change since prior tracing  Recent Labs: 11/28/2015: TSH 2.029 12/01/2015: Hemoglobin 11.3; Platelets 387 12/16/2015: BUN 19; Creat 0.88; Potassium 4.2; Sodium 140 01/28/2016: ALT 105   Recent Lipid Panel    Component Value Date/Time   CHOL 168 01/21/2016 0855   TRIG 106 01/21/2016 0855   HDL 56 01/21/2016 0855   CHOLHDL 3.0 01/21/2016 0855   VLDL 21 01/21/2016 0855   LDLCALC 91 01/21/2016 0855     Physical Exam:    VS:  BP 138/60   Pulse 68   Ht 4\' 9"  (1.448 m)   Wt 97 lb 12.8 oz (44.4 kg)   BMI 21.16 kg/m     Wt Readings from Last 3 Encounters:  05/23/16 97 lb 12.8 oz (44.4 kg)  01/28/16 100 lb (45.4 kg)  01/21/16 100 lb 12.8 oz (45.7 kg)     Physical Exam  Constitutional: She is oriented to person, place, and time. She appears well-developed and well-nourished. No distress.  HENT:  Head: Normocephalic and atraumatic.  Eyes: No scleral icterus.  Neck: No JVD present.  Cardiovascular: Normal rate and regular rhythm.   No murmur heard. Pulmonary/Chest: Effort normal and breath sounds normal. She has no wheezes. She has no rales.  Abdominal: Soft. She exhibits no mass. There is no  tenderness.  Musculoskeletal: She exhibits no edema.  Neurological: She is alert and oriented to person, place, and time.  Skin: Skin is warm and dry.  Psychiatric: She has a normal mood and affect.    ASSESSMENT:    1. Coronary artery disease involving native coronary artery of native heart without angina pectoris   2. Essential hypertension   3. Pure hypercholesterolemia   4. Bilateral carotid artery disease (HCC)   5. Elevated LFTs    PLAN:    In order of problems listed above:  1. CAD - s/p NSTEMI 04/2011 tx with DES to mid LAD and NSTEMI in 6/17 tx with DES to the LAD in the previous stent and angioplasty to the D1. She is doing well without angina. Continue aspirin, Plavix, losartan. She is intolerant to beta blockers. Her Lipitor was DC'd 2/2 elevated LFTs.  2. HTN - BP is controlled.  Continue current Rx.   3. HL - She is off statin 2/2 elevated LFTs.  Check LFTs, Lipids today.  -  Resume statin if okay with GI.  4. Carotid artery disease - Continue ASA. FU due in 7/18.    5. Elevated LFTs -  I sent her back to PCP for evaluation.  She was seen at urgent care in 8/17.  US showed dilated CBD.  Hep serologies were + for Hep surface Ag.  This was + in 2013 as well.  No further testing was done by primary care.  Will refer to GI.  Will hold off on resuming statin Rx until cleared by GI.    Medication Adjustments/Labs and Tests Ordered: Current medicines are reviewed at length with the patient today.  Concerns regarding medicines are outlined above.  Medication changes, Labs and Tests ordered today are outlined in the Patient Instructions noted below. Patient Instructions  Medication Instructions:  Your physician recommends that you continue on your current medications as directed. Please refer to the Current Medication list given to you today.  Labwork: TODAY FASTING LIPID AND LIVER PANEL  Testing/Procedures: Your physician has requested that you have a carotid duplex.  This test is an ultrasound of the carotid arteries in your neck. It looks at blood flow through these arteries that supply the brain with blood. Allow one hour for this exam. There are no restrictions or special instructions. THIS IS TO BE DONE 12/2016  Follow-Up: Charisa Twitty, PAC 6 MONTHS  A REFERRAL TO GASTROENTEROLOGY HAS BEEN PLACED   Any Other Special Instructions Will Be Listed Below (If Applicable).  If you need a refill on your cardiac medications before your next appointment, please call your pharmacy.  Signed, Richardson Dopp, PA-C  05/23/2016 3:26 PM    [facility] [place], [place], [place]  [address] Phone: [phone]; Fax: [phone]

## 2016-05-27 ENCOUNTER — Telehealth: Payer: Self-pay | Admitting: *Deleted

## 2016-05-27 NOTE — Telephone Encounter (Signed)
Pt notified of lab results by phone with verbal understanding. Pt states she is not taking the Lipitor. Pt was advised to start Zetia. Pt states she is not going to start Zetia and that she will continue to work on diet. Pt also states she is not going to go to the GI as advised as she states that her AST and ALT are almost back to normal. I advised pt that I will d/w Nicki Reaper W. PA as to what she wants to do. I advised pt I will d/w PA as to if he is still going to want LFT/FLP in 2 months since pt is not going to start Zetia. I advised pt if we still need lab work I will call her next week to schedule. Pt said ok and thank you.

## 2016-07-25 ENCOUNTER — Encounter: Payer: Self-pay | Admitting: Physician Assistant

## 2016-11-20 NOTE — Progress Notes (Signed)
Cardiology Office Note:    Date:  11/21/2016   ID:  Sascha, Palma 1946-05-26, MRN [id number]  PCP:  Leandrew Koyanagi, MD (Inactive)  Cardiologist:  Dr. Loralie Champagne >> Dr. Sherren Mocha / Richardson Dopp, PA-C   Referring MD: No ref. provider found   Chief Complaint  Patient presents with  . Coronary Artery Disease    follow up    History of Present Illness:    [name] is a 71 y.o. female with a hx of CAD s/p NSTEMI 04/2011 tx with DES to mid LAD, carotid stenosis, HTN, HL, GERD, PAC/PVCs.  At the time of her MI, she was reluctant to start most medications. She has refused multiple medications in the past due to SEs/intolerances (beta blockers, statins, ASA, Diltiazem, Losartan).  Relook heart catheterization in 11/12 done for continued chest pain and fatigue: LAD stent patent, proximal RCA 25%, EF 65%.    Admitted in 6/17 with a non-STEMI.  LHC demonstrated 99% proximal LAD stenosis involving the previous stent and 90% stenosis in the diagonal branch jailed by the previous stent. She underwent PCI with DES to LAD and angioplasty to the diagonal.  After her myocardial infarction, she stopped her beta-blocker and ACE inhibitor due to SEs.  I had her resume Losartan 25 mg QD.     She has a hx of elevated LFTs. Abdominal ultrasound has demonstrated a dilated common bile duct. Hepatitis serologies have demonstrated continued seropositivity for hepatitis B surface antigen.  She was taken off statin Rx and referred to GI.  Last seen by me in 05/2016.  [name] returns for Cardiology follow up.  She is here alone.  She denies chest pain, shortness of breath, syncope, orthopnea, PND or significant pedal edema. She has a lot of bruising and wants to DC Plavix.    Prior CV studies:   The following studies were reviewed today:  Carotid US 7/17:  Stable 40-59% bilateral ICA stenosis. FU 1 year   Echo 11/30/15 EF 60-65%, mild anterior HK, grade 1 diastolic dysfunction,  PASP 23 mmHg   LHC 11/30/15 LAD proximal 99% involving the proximal to mid stent, D1 ostial 90% (jailed by stent) LCx okay RCA proximal 40% EF >65% PCI: 3 x 24 mm Synergy DES to the proximal LAD and POBA to the diagonal branch 1. Severe restenosis proximal LAD stent with involvement of the moderate caliber Diagonal branch which is jailed by the old stent.   2. Successful PTCA/DES x 1 proximal LAD 3. Successful PTCA/angioplasty only Diagonal branch (jailed by old stent) 4. NSTEMI 5. Normal LV systolic function Recommendations: Will continue ASA and Plavix for one year if she tolerates. She is intolerant of statins.    Cardiac Cath 04/2011 LAD: Stent in the proximal vessel is widely patent.  RCA: prox 20%  EF 65%    Carotid US 01/2014 Bilateral ICA 40-59%  Normal subclavian arteries, bilaterally. FU 1 year   Renal Artery Duplex 11/15 Normal caliber abdominal aorta. Normal and symmetrical kidney size. Normal renal arteries, bilaterally. The IVC and renal veins are patent.   Past Medical History:  Diagnosis Date  . Arthritis   . CAD (coronary artery disease)    a. s/p NSTEMI 10/12: LHC 04/02/11: mLAD 95%, EF 60%; PCI: Promus DES to mLAD. b. LHC 04/29/11:  LAD stent patent, proximal RCA 25%, EF 65%  c. 11/2015 - NSTEMI: cath w/ 99% stenosis prox LAD (DES placed) and 90% stenosis D1 (jailed by old stent, POBA  alone performed), pRCA 40, EF > 65%  . Carotid stenosis 2015   a. Carotid US 8/15: 40-59% bilateral ICA stenosis  //   b. Carotid US 7/17: Stable 40-59% bilateral ICA stenosis. FU 1 year  . GERD (gastroesophageal reflux disease)   . HBV (hepatitis B virus) infection   . Hiatal hernia   . History of echocardiogram    a. Echo 6/17:  EF 60-65%, mild anterior HK, grade 1 diastolic dysfunction, mild TR, PASP 23 mmHg  . History of noncompliance with medical treatment   . HLD (hyperlipidemia)   . HTN (hypertension)    a. RA duplex 11/15: norma caliber aorta, norma RA bilaterally    . Lung nodule    a. Pt initially refused further workup. b. Per pulm note, resolved 02/19/14 and never smoker so no further f/u needed.  . Multinodular thyroid   . PVC's (premature ventricular contractions)     Past Surgical History:  Procedure Laterality Date  . BIOPSY THYROID     "negative"  . CARDIAC CATHETERIZATION N/A 11/30/2015   Procedure: Left Heart Cath and Coronary Angiography;  Surgeon: Burnell Blanks, MD;  Location: [facility];  Service: Cardiovascular;  Laterality: N/A;  . CESAREAN SECTION  1985  . CORONARY ANGIOPLASTY WITH STENT PLACEMENT  2012; 11/30/2015  . EXCISIONAL HEMORRHOIDECTOMY  1999  . LIPOMA EXCISION Right ~ 2015   "popliteal"  . TONSILLECTOMY      Current Medications: Current Meds  Medication Sig  . clopidogrel (PLAVIX) 75 MG tablet Take 1 tablet (75 mg total) by mouth daily with breakfast.  . losartan (COZAAR) 25 MG tablet Take 1 tablet (25 mg total) by mouth daily.  . nitroGLYCERIN (NITROSTAT) 0.4 MG SL tablet Place 1 tablet (0.4 mg total) under the tongue every 5 (five) minutes as needed for chest pain.  . [DISCONTINUED] aspirin EC 81 MG tablet Take 1 tablet (81 mg total) by mouth daily.  . [DISCONTINUED] clopidogrel (PLAVIX) 75 MG tablet Take 1 tablet (75 mg total) by mouth daily with breakfast.     Allergies:   Atorvastatin; Crestor [rosuvastatin]; Ibuprofen; and Pravastatin sodium   Social History   Social History  . Marital status: Married    Spouse name: N/A  . Number of children: 1  . Years of education: N/A   Occupational History  . 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   Social History Main Topics  . Smoking status: Never Smoker  . Smokeless tobacco: Never Used  . Alcohol use No  . Drug use: No  . Sexual activity: No   Other Topics Concern  . None   Social History Narrative   Marital status: married x 31 years.  From [place]; to [place] 1984.      Children:  1 child (30); no grandchildren.       Lives: with husband.      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      Tobacco: never      Alcohol: none       Exercise: works in the garden.      Family Hx: The patient's family history includes Alcohol abuse in her father; Cancer (age of onset: 71) in her father; Diabetes in her sister and sister; Heart attack in her brother; Prostate cancer in her father. There is no history of Stroke.  ROS:   Please see the history of present illness.    Review of Systems  Hematologic/Lymphatic: Bruises/bleeds easily.   All other  systems reviewed and are negative.   EKGs/Labs/Other Test Reviewed:    EKG:  EKG is  ordered today.  The ekg ordered today demonstrates NSR, HR 67, normal axis, NSSTTW changes, QTc 420 ms, no significant change since last tracing   Recent Labs: 11/28/2015: TSH 2.029 12/01/2015: Hemoglobin 11.3; Platelets 387 12/16/2015: BUN 19; Creat 0.88; Potassium 4.2; Sodium 140 05/23/2016: ALT 36   Recent Lipid Panel Lab Results  Component Value Date/Time   CHOL 177 05/23/2016 10:23 AM   TRIG 210 (H) 05/23/2016 10:23 AM   HDL 40 (L) 05/23/2016 10:23 AM   CHOLHDL 4.4 05/23/2016 10:23 AM   LDLCALC 95 05/23/2016 10:23 AM    Physical Exam:    VS:  BP (!) 170/60   Pulse 67   Ht _0  (1.448 m)   Wt 95 lb 12.8 oz (43.5 kg)   BMI 20.73 kg/m     Wt Readings from Last 3 Encounters:  11/21/16 95 lb 12.8 oz (43.5 kg)  05/23/16 97 lb 12.8 oz (44.4 kg)  01/28/16 100 lb (45.4 kg)     Physical Exam  Constitutional: She is oriented to person, place, and time. She appears well-developed and well-nourished. No distress.  HENT:  Head: Normocephalic and atraumatic.  Eyes: No scleral icterus.  Neck: Normal range of motion. No JVD present.  Cardiovascular: Normal rate, regular rhythm, S1 normal and S2 normal.   Murmur heard.  Low-pitched early systolic murmur is present with a grade of 1/6  Pulmonary/Chest: Breath sounds normal. She has no wheezes. She has no rhonchi. She  has no rales.  Abdominal: Soft. There is no hepatomegaly. There is no tenderness.  Musculoskeletal: She exhibits no edema.  Neurological: She is alert and oriented to person, place, and time.  Skin: Skin is warm and dry.  Psychiatric: She has a normal mood and affect.    ASSESSMENT:    1. Coronary artery disease involving native coronary artery of native heart without angina pectoris   2. Essential hypertension   3. Pure hypercholesterolemia   4. Elevated LFTs   5. Bilateral carotid artery disease ([place])    PLAN:    In order of problems listed above:  1. Coronary artery disease involving native coronary artery of native heart without angina pectoris - s/p NSTEMI 04/2011 tx with DES to mid LAD and NSTEMI in 6/17 tx with DES to the LAD in the previous stent and angioplasty to the D1.  She would like to stop Clopidogrel.  I reviewed this with Dr. Sherren Mocha today.  B/c of her hx of recurrent MI and having a stent within a stent, we feel that remaining on Clopidogrel alone would be the safest approach for secondary prevention.  She is quite hesitant to do this and would rather alternate ASA and Clopidogrel every other day or just take Clopidogrel for another year.  However, I have explained to her that her risk is higher for recurrent ACS on ASA alone.  I have also recommended aggressive treatment of her BP and cholesterol.  She is not willing to adjust her Losartan or try Ezetimibe.    2. Essential hypertension - Repeat BP by me 144/70.  I would like her to increase Losartan to 50 mg QD but she declines.   3. Pure hypercholesterolemia - I have recommended taking Ezetimibe for HLD.  But, she declines.  Check CMET today.   4. Elevated LFTs -  She is not interested in seeing GI. I have explained to her that  there is a risk of significant morbidity if she does not have elevated LFTs investigated further.  I am not comfortable trying a statin again unless she has a GI workup.    -  Plan:  Comp Met (CMET)  5. Bilateral carotid artery disease ([name]) -  She is due for carotid ultrasound in 7/18.  -  Plan: VAS US CAROTID    Total time spent with patient today 40  minutes. This includes reviewing records, evaluating the patient and coordinating care. Face-to-face time >50%.   Dispo:  Return in about 6 months (around 05/23/2017) for Routine Follow Up, w/ Richardson Dopp, PA-C.   Medication Adjustments/Labs and Tests Ordered: Current medicines are reviewed at length with the patient today.  Concerns regarding medicines are outlined above.  Orders/Tests:  Orders Placed This Encounter  Procedures  . Comp Met (CMET)  . EKG 12-Lead   Medication changes: Meds ordered this encounter  Medications  . clopidogrel (PLAVIX) 75 MG tablet    Sig: Take 1 tablet (75 mg total) by mouth daily with breakfast.    Dispense:  90 tablet    Refill:  3   Signed, Richardson Dopp, PA-C  11/21/2016 9:01 AM    [facility] [place], [place], [place]  [address] Phone: [phone]; Fax: [phone]

## 2016-11-21 ENCOUNTER — Ambulatory Visit (INDEPENDENT_AMBULATORY_CARE_PROVIDER_SITE_OTHER): Payer: Medicare Other | Admitting: Physician Assistant

## 2016-11-21 ENCOUNTER — Encounter: Payer: Self-pay | Admitting: Physician Assistant

## 2016-11-21 ENCOUNTER — Other Ambulatory Visit: Payer: Self-pay | Admitting: Student

## 2016-11-21 ENCOUNTER — Encounter (INDEPENDENT_AMBULATORY_CARE_PROVIDER_SITE_OTHER): Payer: Self-pay

## 2016-11-21 VITALS — BP 170/60 | HR 67 | Ht <= 58 in | Wt 95.8 lb

## 2016-11-21 DIAGNOSIS — I2510 Atherosclerotic heart disease of native coronary artery without angina pectoris: Secondary | ICD-10-CM

## 2016-11-21 DIAGNOSIS — E7800 Pure hypercholesterolemia, unspecified: Secondary | ICD-10-CM

## 2016-11-21 DIAGNOSIS — I10 Essential (primary) hypertension: Secondary | ICD-10-CM

## 2016-11-21 DIAGNOSIS — I779 Disorder of arteries and arterioles, unspecified: Secondary | ICD-10-CM | POA: Diagnosis not present

## 2016-11-21 DIAGNOSIS — R7989 Other specified abnormal findings of blood chemistry: Secondary | ICD-10-CM | POA: Diagnosis not present

## 2016-11-21 DIAGNOSIS — R945 Abnormal results of liver function studies: Secondary | ICD-10-CM

## 2016-11-21 DIAGNOSIS — I739 Peripheral vascular disease, unspecified: Secondary | ICD-10-CM

## 2016-11-21 LAB — COMPREHENSIVE METABOLIC PANEL
ALT: 32 IU/L (ref 0–32)
AST: 41 IU/L — ABNORMAL HIGH (ref 0–40)
Albumin/Globulin Ratio: 1.7 (ref 1.2–2.2)
Albumin: 4.8 g/dL (ref 3.6–4.8)
Alkaline Phosphatase: 68 IU/L (ref 39–117)
BILIRUBIN TOTAL: 0.4 mg/dL (ref 0.0–1.2)
BUN/Creatinine Ratio: 11 — ABNORMAL LOW (ref 12–28)
BUN: 9 mg/dL (ref 8–27)
CALCIUM: 9.2 mg/dL (ref 8.7–10.3)
CHLORIDE: 98 mmol/L (ref 96–106)
CO2: 25 mmol/L (ref 20–29)
CREATININE: 0.83 mg/dL (ref 0.57–1.00)
GFR, EST AFRICAN AMERICAN: 83 mL/min/{1.73_m2} (ref >59)
GFR, EST NON AFRICAN AMERICAN: 72 mL/min/{1.73_m2} (ref >59)
GLUCOSE: 98 mg/dL (ref 65–99)
Globulin, Total: 2.8 g/dL (ref 1.5–4.5)
Potassium: 4.4 mmol/L (ref 3.5–5.2)
Sodium: 136 mmol/L (ref 134–144)
TOTAL PROTEIN: 7.6 g/dL (ref 6.0–8.5)

## 2016-11-21 MED ORDER — CLOPIDOGREL BISULFATE 75 MG PO TABS: 75.0000 mg | ORAL_TABLET | Freq: Every day | ORAL | 3 refills | Status: DC

## 2016-11-21 NOTE — Patient Instructions (Signed)
Medication Instructions:  1. STOP ASPIRIN  2. YOU MUST REMAIN ON PLAVIX  3. CONTINUE ALL OTHER MEDICATIONS AS PRESCRIBED   Labwork: 1. TODAY CMET  Testing/Procedures: 1. Your physician has requested that you have a carotid duplex. This test is an ultrasound of the carotid arteries in your neck. It looks at blood flow through these arteries that supply the brain with blood. Allow one hour for this exam. There are no restrictions or special instructions. PT IS DUE TO HAVE THIS DONE IN July, 2018    Follow-Up: [place], [facility] IN 6 MONTHS    Any Other Special Instructions Will Be Listed Below (If Applicable).     If you need a refill on your cardiac medications before your next appointment, please call your pharmacy.

## 2016-11-22 ENCOUNTER — Telehealth: Payer: Self-pay | Admitting: *Deleted

## 2016-11-22 NOTE — Telephone Encounter (Signed)
Pt has been notified of lab results and findings of mildly elevated LFT's still. Pt was advised several months ago that she the PA would like for her to see a Gastroenterologist which at that time the pt refused. I went over the recommendation again per PA to se GI and pt said no she is fine and refused to see GI. Pt asked about her cholesterol #'s. I explained to the pt that the PA did not order for cholesterol to be checked yesterday. I stated her appt in 05/2017 with the PA is at 8:15 and if she comes in fasting on that day the PA may go ahead and check her cholesterol that day. Pt is agreeable to this plan of care. Pt then asked what her AST and ALT were. I gave the pt her #'s for these 2 levels. Pt thanked me for my call today.

## 2016-11-22 NOTE — Telephone Encounter (Signed)
-----   Message from Liliane Shi, [place] sent at 11/21/2016  5:42 PM EDT ----- Please call the patient Kidney function is normal. Liver enzymes remain mildly elevated. Recommend she see Gastroenterology as previously recommended. Richardson Dopp, PA-C    11/21/2016 5:42 PM

## 2016-11-30 ENCOUNTER — Telehealth: Payer: Self-pay | Admitting: Physician Assistant

## 2016-11-30 NOTE — Telephone Encounter (Signed)
New message ° ° ° °Pt is calling asking for a call back from nurse. She did not say what it was about. °

## 2016-12-02 NOTE — Telephone Encounter (Signed)
Called pt back from her call. Could not lmom due VM not set up on phone.

## 2016-12-04 ENCOUNTER — Other Ambulatory Visit: Payer: Self-pay | Admitting: Student

## 2016-12-05 NOTE — Telephone Encounter (Signed)
I was able to reach pt today from her call las t week. Pt was wanting to know who was her Cardiologist now since Dr. Aundra Dubin is no longer her Cardiologist. I stated Dr. Burt Knack will be her primary Cardiologist. Pt thanked me for my call back today.

## 2016-12-21 ENCOUNTER — Encounter (HOSPITAL_COMMUNITY): Payer: Medicare Other

## 2016-12-22 ENCOUNTER — Other Ambulatory Visit: Payer: Self-pay | Admitting: Physician Assistant

## 2016-12-22 DIAGNOSIS — I10 Essential (primary) hypertension: Secondary | ICD-10-CM

## 2016-12-22 MED ORDER — LOSARTAN POTASSIUM 25 MG PO TABS
25.0000 mg | ORAL_TABLET | Freq: Every day | ORAL | 3 refills | Status: DC
Start: 2016-12-22 — End: 2017-12-17

## 2016-12-29 ENCOUNTER — Encounter (HOSPITAL_COMMUNITY): Payer: Medicare Other

## 2017-02-08 ENCOUNTER — Emergency Department (HOSPITAL_COMMUNITY): Payer: Medicare Other

## 2017-02-08 ENCOUNTER — Other Ambulatory Visit: Payer: Self-pay

## 2017-02-08 ENCOUNTER — Observation Stay (HOSPITAL_COMMUNITY)
Admission: EM | Admit: 2017-02-08 | Discharge: 2017-02-09 | Disposition: A | Discharge status: Home / Self Care | Payer: Medicare Other | Attending: Family Medicine | Admitting: Family Medicine

## 2017-02-08 DIAGNOSIS — I10 Essential (primary) hypertension: Secondary | ICD-10-CM

## 2017-02-08 DIAGNOSIS — R51 Headache: Secondary | ICD-10-CM | POA: Diagnosis not present

## 2017-02-08 DIAGNOSIS — G3189 Other specified degenerative diseases of nervous system: Secondary | ICD-10-CM | POA: Diagnosis not present

## 2017-02-08 DIAGNOSIS — I700 Atherosclerosis of aorta: Secondary | ICD-10-CM | POA: Diagnosis not present

## 2017-02-08 DIAGNOSIS — Y998 Other external cause status: Secondary | ICD-10-CM | POA: Diagnosis not present

## 2017-02-08 DIAGNOSIS — I5032 Chronic diastolic (congestive) heart failure: Secondary | ICD-10-CM | POA: Diagnosis not present

## 2017-02-08 DIAGNOSIS — S0003XA Contusion of scalp, initial encounter: Secondary | ICD-10-CM | POA: Diagnosis not present

## 2017-02-08 DIAGNOSIS — K219 Gastro-esophageal reflux disease without esophagitis: Secondary | ICD-10-CM | POA: Insufficient documentation

## 2017-02-08 DIAGNOSIS — Z888 Allergy status to other drugs, medicaments and biological substances status: Secondary | ICD-10-CM | POA: Insufficient documentation

## 2017-02-08 DIAGNOSIS — R911 Solitary pulmonary nodule: Secondary | ICD-10-CM | POA: Diagnosis not present

## 2017-02-08 DIAGNOSIS — I6523 Occlusion and stenosis of bilateral carotid arteries: Secondary | ICD-10-CM | POA: Insufficient documentation

## 2017-02-08 DIAGNOSIS — I080 Rheumatic disorders of both mitral and aortic valves: Secondary | ICD-10-CM | POA: Insufficient documentation

## 2017-02-08 DIAGNOSIS — I6782 Cerebral ischemia: Secondary | ICD-10-CM | POA: Insufficient documentation

## 2017-02-08 DIAGNOSIS — R42 Dizziness and giddiness: Secondary | ICD-10-CM | POA: Diagnosis not present

## 2017-02-08 DIAGNOSIS — E785 Hyperlipidemia, unspecified: Secondary | ICD-10-CM | POA: Insufficient documentation

## 2017-02-08 DIAGNOSIS — Z955 Presence of coronary angioplasty implant and graft: Secondary | ICD-10-CM | POA: Diagnosis not present

## 2017-02-08 DIAGNOSIS — S0990XA Unspecified injury of head, initial encounter: Secondary | ICD-10-CM

## 2017-02-08 DIAGNOSIS — I493 Ventricular premature depolarization: Secondary | ICD-10-CM | POA: Diagnosis not present

## 2017-02-08 DIAGNOSIS — Z7902 Long term (current) use of antithrombotics/antiplatelets: Secondary | ICD-10-CM | POA: Diagnosis not present

## 2017-02-08 DIAGNOSIS — I110 Hypertensive heart disease with heart failure: Secondary | ICD-10-CM | POA: Insufficient documentation

## 2017-02-08 DIAGNOSIS — Y9389 Activity, other specified: Secondary | ICD-10-CM | POA: Diagnosis not present

## 2017-02-08 DIAGNOSIS — Z886 Allergy status to analgesic agent status: Secondary | ICD-10-CM | POA: Diagnosis not present

## 2017-02-08 DIAGNOSIS — Z79899 Other long term (current) drug therapy: Secondary | ICD-10-CM | POA: Diagnosis not present

## 2017-02-08 DIAGNOSIS — I2510 Atherosclerotic heart disease of native coronary artery without angina pectoris: Secondary | ICD-10-CM | POA: Diagnosis not present

## 2017-02-08 DIAGNOSIS — M1990 Unspecified osteoarthritis, unspecified site: Secondary | ICD-10-CM | POA: Diagnosis not present

## 2017-02-08 DIAGNOSIS — I252 Old myocardial infarction: Secondary | ICD-10-CM | POA: Diagnosis not present

## 2017-02-08 DIAGNOSIS — R55 Syncope and collapse: Secondary | ICD-10-CM | POA: Diagnosis not present

## 2017-02-08 DIAGNOSIS — W01198A Fall on same level from slipping, tripping and stumbling with subsequent striking against other object, initial encounter: Secondary | ICD-10-CM | POA: Diagnosis not present

## 2017-02-08 DIAGNOSIS — Y92008 Other place in unspecified non-institutional (private) residence as the place of occurrence of the external cause: Secondary | ICD-10-CM | POA: Diagnosis not present

## 2017-02-08 LAB — BASIC METABOLIC PANEL
Anion gap: 9 (ref 5–15)
BUN: 6 mg/dL (ref 6–20)
CALCIUM: 8.9 mg/dL (ref 8.9–10.3)
CO2: 25 mmol/L (ref 22–32)
CREATININE: 0.85 mg/dL (ref 0.44–1.00)
Chloride: 102 mmol/L (ref 101–111)
GLUCOSE: 131 mg/dL — AB (ref 65–99)
Potassium: 4 mmol/L (ref 3.5–5.1)
Sodium: 136 mmol/L (ref 135–145)

## 2017-02-08 LAB — CBC
HCT: 38.7 % (ref 36.0–46.0)
Hemoglobin: 12.9 g/dL (ref 12.0–15.0)
MCH: 31.3 pg (ref 26.0–34.0)
MCHC: 33.3 g/dL (ref 30.0–36.0)
MCV: 93.9 fL (ref 78.0–100.0)
PLATELETS: 383 10*3/uL (ref 150–400)
RBC: 4.12 MIL/uL (ref 3.87–5.11)
RDW: 12 % (ref 11.5–15.5)
WBC: 10.2 10*3/uL (ref 4.0–10.5)

## 2017-02-08 LAB — URINALYSIS, ROUTINE W REFLEX MICROSCOPIC
BILIRUBIN URINE: NEGATIVE
Glucose, UA: NEGATIVE mg/dL
HGB URINE DIPSTICK: NEGATIVE
KETONES UR: NEGATIVE mg/dL
Leukocytes, UA: NEGATIVE
NITRITE: NEGATIVE
Protein, ur: NEGATIVE mg/dL
Specific Gravity, Urine: 1.005 (ref 1.005–1.030)
pH: 7 (ref 5.0–8.0)

## 2017-02-08 LAB — TROPONIN I

## 2017-02-08 LAB — LIPID PANEL
Cholesterol: 175 mg/dL (ref 0–200)
HDL: 51 mg/dL (ref >40)
LDL CALC: 106 mg/dL — AB (ref 0–99)
Total CHOL/HDL Ratio: 3.4 RATIO
Triglycerides: 88 mg/dL (ref <150)
VLDL: 18 mg/dL (ref 0–40)

## 2017-02-08 LAB — HEMOGLOBIN A1C
HEMOGLOBIN A1C: 5.1 % (ref 4.8–5.6)
MEAN PLASMA GLUCOSE: 99.67 mg/dL

## 2017-02-08 LAB — I-STAT TROPONIN, ED: TROPONIN I, POC: 0.01 ng/mL (ref 0.00–0.08)

## 2017-02-08 LAB — CBG MONITORING, ED: GLUCOSE-CAPILLARY: 122 mg/dL — AB (ref 65–99)

## 2017-02-08 LAB — TSH: TSH: 0.914 u[IU]/mL (ref 0.350–4.500)

## 2017-02-08 MED ORDER — CLOPIDOGREL BISULFATE 75 MG PO TABS
75.0000 mg | ORAL_TABLET | Freq: Every day | ORAL | Status: DC
  Administered 2017-02-09: 75 mg via ORAL
  Filled 2017-02-08: qty 1

## 2017-02-08 MED ORDER — NITROGLYCERIN 0.4 MG SL SUBL: 0.4000 mg | SUBLINGUAL_TABLET | SUBLINGUAL | Status: DC | PRN

## 2017-02-08 MED ORDER — SODIUM CHLORIDE 0.9% FLUSH: 3.0000 mL | Freq: Two times a day (BID) | INTRAVENOUS | Status: DC

## 2017-02-08 MED ORDER — LOSARTAN POTASSIUM 25 MG PO TABS: 25.0000 mg | ORAL_TABLET | Freq: Every day | ORAL | Status: DC

## 2017-02-08 MED ORDER — ONDANSETRON HCL 4 MG PO TABS: 4.0000 mg | ORAL_TABLET | Freq: Four times a day (QID) | ORAL | Status: DC | PRN

## 2017-02-08 MED ORDER — ACETAMINOPHEN 325 MG PO TABS: 650.0000 mg | ORAL_TABLET | Freq: Four times a day (QID) | ORAL | Status: DC | PRN

## 2017-02-08 MED ORDER — POLYETHYLENE GLYCOL 3350 17 G PO PACK: 17.0000 g | PACK | Freq: Every day | ORAL | Status: DC | PRN

## 2017-02-08 MED ORDER — ACETAMINOPHEN 650 MG RE SUPP: 650.0000 mg | Freq: Four times a day (QID) | RECTAL | Status: DC | PRN

## 2017-02-08 MED ORDER — ALUM & MAG HYDROXIDE-SIMETH 200-200-20 MG/5ML PO SUSP: 30.0000 mL | Freq: Four times a day (QID) | ORAL | Status: DC | PRN

## 2017-02-08 MED ORDER — ONDANSETRON HCL 4 MG/2ML IJ SOLN: 4.0000 mg | Freq: Four times a day (QID) | INTRAMUSCULAR | Status: DC | PRN

## 2017-02-08 NOTE — ED Notes (Addendum)
Admitting at bedside 

## 2017-02-08 NOTE — ED Notes (Signed)
Pt just returned from US

## 2017-02-08 NOTE — ED Triage Notes (Signed)
Pt sts was working out on the garden this morning and was moving up and down and had an episode of lightheadedness and dizziness that resulted in a syncopal episode in her driveway- states she thinks it was only a couple of seconds. Pt has hematoma to right head. Pt denies and pain or dizziness presently. No neuro deficits noted.

## 2017-02-08 NOTE — ED Provider Notes (Signed)
[facility] DEPT Provider Note   CSN: [id number] Arrival date & time: 02/08/17  2703     History   Chief Complaint Chief Complaint  Patient presents with  . Loss of Consciousness    HPI [name] is a 70 y.o. female.  Patient was working out in the garden this morning and was moving up and down and had a helmet soda of lightheadedness and dizziness and then had a syncopal episode in her driveway without a lot of warning. Patient states shg. Her husband was outside with her but did not witness this. Patient struck her right side of her head hard on the driveway and has a bump there. Denies any back pain chest pain hip pain or extremity pain. Patient asymptomatic currently. No significant headache. Patient does have a history of coronary artery disease is on Plavix and has stents.      Past Medical History:  Diagnosis Date  . Arthritis   . CAD (coronary artery disease)    a. s/p NSTEMI 10/12: LHC 04/02/11: mLAD 95%, EF 60%; PCI: Promus DES to mLAD. b. LHC 04/29/11:  LAD stent patent, proximal RCA 25%, EF 65%  c. 11/2015 - NSTEMI: cath w/ 99% stenosis prox LAD (DES placed) and 90% stenosis D1 (jailed by old stent, POBA alone performed), pRCA 40, EF > 65%  . Carotid stenosis 2015   a. Carotid US 8/15: 40-59% bilateral ICA stenosis  //   b. Carotid US 7/17: Stable 40-59% bilateral ICA stenosis. FU 1 year  . GERD (gastroesophageal reflux disease)   . HBV (hepatitis B virus) infection   . Hiatal hernia   . History of echocardiogram    a. Echo 6/17:  EF 60-65%, mild anterior HK, grade 1 diastolic dysfunction, mild TR, PASP 23 mmHg  . History of noncompliance with medical treatment   . HLD (hyperlipidemia)   . HTN (hypertension)    a. RA duplex 11/15: norma caliber aorta, norma RA bilaterally   . Lung nodule    a. Pt initially refused further workup. b. Per pulm note, resolved 02/19/14 and never smoker so no further f/u needed.  . Multinodular thyroid   . PVC's (premature  ventricular contractions)     Patient Active Problem List   Diagnosis Date Noted  . Status post coronary artery stent placement   . History of non-ST elevation myocardial infarction (NSTEMI) 11/28/2015  . PVC's (premature ventricular contractions)   . Bilateral carotid artery disease ([place])   . Upper airway cough syndrome 02/19/2014  . Lung nodule, solitary 06/01/2013  . Elevated LFTs 09/11/2011  . CAD (coronary artery disease)   . Chest pain 09/15/2010  . Hyperlipidemia 09/15/2010  . Hypertension 09/15/2010    Past Surgical History:  Procedure Laterality Date  . BIOPSY THYROID     "negative"  . CARDIAC CATHETERIZATION N/A 11/30/2015   Procedure: Left Heart Cath and Coronary Angiography;  Surgeon: Burnell Blanks, MD;  Location: [facility];  Service: Cardiovascular;  Laterality: N/A;  . CESAREAN SECTION  1985  . CORONARY ANGIOPLASTY WITH STENT PLACEMENT  2012; 11/30/2015  . EXCISIONAL HEMORRHOIDECTOMY  1999  . LIPOMA EXCISION Right ~ 2015   "popliteal"  . TONSILLECTOMY      OB History    No data available       Home Medications    Prior to Admission medications   Medication Sig Start Date End Date Taking? Authorizing Provider  clopidogrel (PLAVIX) 75 MG tablet Take 1 tablet (75 mg total)  by mouth daily with breakfast. 11/21/16   Richardson Dopp T, PA-C  losartan (COZAAR) 25 MG tablet Take 1 tablet (25 mg total) by mouth daily. 12/22/16   Richardson Dopp T, PA-C  nitroGLYCERIN (NITROSTAT) 0.4 MG SL tablet PLACE 1 TABLET (0.4 MG TOTAL) UNDER THE TONGUE EVERY 5 (FIVE) MINUTES AS NEEDED FOR CHEST PAIN. 12/06/16   Liliane Shi, PA-C    Family History Family History  Problem Relation Age of Onset  . Prostate cancer Father   . Cancer Father 53       bladder cancer  . Alcohol abuse Father   . Diabetes Sister   . Diabetes Sister   . Heart attack Brother   . Stroke Neg Hx     Social History Social History  Substance Use Topics  . Smoking status: Never  Smoker  . Smokeless tobacco: Never Used  . Alcohol use No     Allergies   Atorvastatin; Crestor [rosuvastatin]; Ibuprofen; and Pravastatin sodium   Review of Systems Review of Systems  Constitutional: Negative for fatigue and fever.  HENT: Negative for congestion.   Eyes: Negative for visual disturbance.  Respiratory: Negative for shortness of breath.   Cardiovascular: Negative for chest pain.  Gastrointestinal: Negative for abdominal pain.  Genitourinary: Negative for dysuria.  Musculoskeletal: Negative for back pain and neck pain.  Skin: Positive for wound.  Neurological: Positive for dizziness, syncope, light-headedness and headaches. Negative for seizures, facial asymmetry, weakness and numbness.  Hematological: Does not bruise/bleed easily.  Psychiatric/Behavioral: Negative for confusion.     Physical Exam Updated Vital Signs BP (!) 142/60   Pulse 70   Temp 97.6 F (36.4 C) (Oral)   Resp 19   Ht 1.448 m (4\' 9" )   Wt 42.2 kg (93 lb)   SpO2 99%   BMI 20.13 kg/m   Physical Exam  Constitutional: She is oriented to person, place, and time. She appears well-developed and well-nourished. No distress.  HENT:  Head: Normocephalic.  Left temporal parietal area with about a 3 x 4 cm abrasion hematoma, No laceration. No active bleeding.  Eyes: Pupils are equal, round, and reactive to light. Conjunctivae and EOM are normal.  Neck: Normal range of motion. Neck supple.  No posterior tenderness to palpation to the cervical spine area.Excellent range of motion without any discomfort.  Cardiovascular: Normal rate and regular rhythm.   Pulmonary/Chest: Effort normal and breath sounds normal. No respiratory distress.  Abdominal: Soft. Bowel sounds are normal. There is no tenderness.  Musculoskeletal: Normal range of motion. She exhibits no edema or deformity.  Neurological: She is alert and oriented to person, place, and time. No cranial nerve deficit or sensory deficit. She  exhibits normal muscle tone. Coordination normal.  Skin: Skin is warm.  Nursing note and vitals reviewed.    ED Treatments / Results  Labs (all labs ordered are listed, but only abnormal results are displayed) Labs Reviewed  BASIC METABOLIC PANEL - Abnormal; Notable for the following:       Result Value   Glucose, Bld 131 (*)    All other components within normal limits  CBG MONITORING, ED - Abnormal; Notable for the following:    Glucose-Capillary 122 (*)    All other components within normal limits  CBC  URINALYSIS, ROUTINE W REFLEX MICROSCOPIC  I-STAT TROPONIN, ED    EKG  EKG Interpretation  Date/Time:  Wednesday February 08 2017 10:07:34 EDT Ventricular Rate:  62 PR Interval:  144 QRS Duration: 70 QT Interval:  424 QTC Calculation: 430 R Axis:   52 Text Interpretation:  Normal sinus rhythm Nonspecific T wave abnormality Abnormal ECG artifact V1 Confirmed by Fredia Sorrow [phone]) on 02/08/2017 12:22:34 PM       Radiology Dg Chest 2 View  Result Date: 02/08/2017 CLINICAL DATA:  Lightheadedness and dizziness elevated syncope. EXAM: CHEST  2 VIEW COMPARISON:  11/28/2015. FINDINGS: Trachea is midline. Heart size stable. Thoracic aorta is calcified. Lungs are clear. No pleural fluid. IMPRESSION: No acute findings. Electronically Signed   By: Lorin Picket M.D.   On: 02/08/2017 12:56   Ct Head Wo Contrast  Result Date: 02/08/2017 CLINICAL DATA:  Initial evaluation for acute posttraumatic headache, dizziness. EXAM: CT HEAD WITHOUT CONTRAST TECHNIQUE: Contiguous axial images were obtained from the base of the skull through the vertex without intravenous contrast. COMPARISON:  Prior CT from 11/15/2010. FINDINGS: Brain: Generalized age related cerebral atrophy with mild chronic small vessel ischemic disease. No acute intracranial hemorrhage. No evidence for acute large vessel territory infarct. No mass lesion, midline shift or mass effect. No hydrocephalus. No extra-axial fluid  collection. Vascular: No hyperdense vessel. Scattered vascular calcifications noted within the carotid siphons. Skull: Small soft tissue contusion at the left frontal parietal scalp. Calvarium intact. Sinuses/Orbits: Visualized globes and orbital soft tissues within normal limits. Paranasal sinuses and mastoid air cells are clear. IMPRESSION: 1. No acute intracranial process. 2. Small left frontoparietal scalp contusion. 3. Generalized age-related cerebral atrophy with mild chronic small vessel ischemic disease. Electronically Signed   By: Jeannine Boga M.D.   On: 02/08/2017 12:57    Procedures Procedures (including critical care time)  Medications Ordered in ED Medications - No data to display   Initial Impression / Assessment and Plan / ED Course  I have reviewed the triage vital signs and the nursing notes.  Pertinent labs & imaging results that were available during my care of the patient were reviewed by me and considered in my medical decision making (see chart for details).     Patient was somewhat unexplained syncopal episode. Sounds as if it was brief. The patient didn't fall hard enough to strike her head on the concrete of the driveway.Head CT negative basic lab workup negative. Patient known to have a history of coronary artery disease and does have a stent. Patient denies any chest pain. Initial troponin was negative EKG without acute changes.  Final Clinical Impressions(s) / ED Diagnoses   Final diagnoses:  Syncope, unspecified syncope type  Injury of head, initial encounter    New Prescriptions New Prescriptions   No medications on file     Fredia Sorrow, MD 02/08/17 1350

## 2017-02-08 NOTE — ED Notes (Signed)
Patient transported to CT 

## 2017-02-08 NOTE — H&P (Signed)
[facility] Admission History and Physical Service Pager: [phone]  Patient name: [name] Medical record number: [id number] Date of birth: Nov 10, 1945 Age: 71 y.o. Gender: female  Primary Care Provider: Leandrew Koyanagi, MD (Inactive) Consultants: None Code Status: Full (per discussion on admission)   Chief Complaint: Passed out while gardening  Assessment and Plan: [name] is a 71 y.o. female presenting with a syncopal episode earlier today. PMH significant for NSTEMI x 2 w/ stents, CAD, HTN, HLD, B/L carotid disease, increased LFTs  Syncope: Vital signs stable on admission, no hypo/hypertension, afebrile, 100% O2 on room air. BMP and CBC within normal limits. Troponin negative 1. EKG showing normal sinus rhythm and no ST changes. CT head negative for any sign of stroke or intracranial bleed, does have visible bruise on left side of head. Chest x-ray shows no acute findings. On physical exam, patient neurologically intact without any deficits. She does have a hematoma on left side of head where she fell. Less likely seizure based on history. Unlikely CVA or TIA as CT Head negative and patient has intact neurological exam. Patient passed out in the setting of being outside working in the garden today. She has increased risk factors including CAD, NSTEMI x 2, HLD, HTN. Leading differentials include orthostatic hypotension vs vasovagal episode vs dehydration (admits to not drinking a lot of water) vs arrhythmia vs ACS event.  -Admit to telemetry medicine teaching service, telemetry, attending Dr. Gwendlyn Deutscher -Vital signs per floor protocol  -continuous cardiac monitoring  -trend troponins -Echocardiogram -EKG tomorrow morning -Orthostatic vitals -Risk stratification labs: Hgb A1C, Lipid Panel, TSH   Hypertension: Normotensive on admission  -Continue home Cozaar 25 mg daily  CAD, NSTEMI x2 w/ stents, b/l carotid disease: Has been following  with cardiology. No CP, SOB, or edema on exam. 7/17 carotid U/S showing stable 40-59% bilateral ICA stenosis. Cardiology wanted repeat carotid U/S last month but patient did not get it - Will repeat carotid U/S here while she is inpatient per cardiology recs - Continue home Plavix and Cozaar  HLD: Dec 2017 Lipid panel showing Cholesterol 177, HDL 40, Triglycerides 210. Per previous cardiology notes, patient intolerant of multiple statins due to muscle cramps (atorvastatin, crestor, pravastatin) and most recently she was taken off statin therapy due to increased LFTs. She was supposed to see GI for increased LFTs but refused to go. On admission she states she does not want to take any statins because it will give her diabetes. Counseled patient extensively on admission about benefit of statin for her.  - Lipid Panel - Hold statins because of patient refusal and hx of increased LFTs  Hx of increased LFTs: Most recent CMP in June 2018 showing increased AST to 41 and normal ALT 32.  - CMP tomorrow AM  HFpEF: June 2017 Echo showing EF 60-65% G1DD. Does not appear to be having a heart failure exacerbation  FEN/GI: SLIV, heart healthy Prophylaxis: SCDs  Disposition: Admit to telemetry medicine teaching service, telemetry, attending Dr. Gwendlyn Deutscher  History of Present Illness:  [name] is a 71 y.o. female presenting with syncope. PMH significant for NSTEMI x 2 w/ stents, CAD, HTN, HLD, B/L carotid disease, increased LFTs, lung nodule.    She states that she was working in her garden, doing a lot of standing up and bending down work and began to feel dizzy. She walked over to her car and leaned on it and proceeded to lose consciousness and felt a thud when  she hit the left side of her head on the driveway. She states that she lost consciousness only for a few seconds and when awoke she called to her husband for help. He helped her inside the house, where she rested and put ice on her head. She  decided to come to he hospital because of fear of her prior cardiac history. She has never had an episode like this prior to today. She also mentioned that she recently started a diet that mainly consisted of fruits and vegetables and since starting says she has felt more lightheadedness than usual. She denies loss of urinary/fecal incontinence, chest pain, shortness or difficulty of breath, LE edema, fevers, nausea, vomiting or burning with urination. She did admit to recent weight loss, diarrhea (2x/day) and has not been drinking enough water about 2 cups per day. At baseline, patient is very functional performing all her ADLs and AIDLs independently. Lives with her husband. Patient states that she studied organic chemistry and knows the side effects of medications and that's why she doesn't new medications.   Review Of Systems: Per HPI with the following additions: see HPI  ROS  Patient Active Problem List   Diagnosis Date Noted  . Status post coronary artery stent placement   . History of non-ST elevation myocardial infarction (NSTEMI) 11/28/2015  . PVC's (premature ventricular contractions)   . Bilateral carotid artery disease ([place])   . Upper airway cough syndrome 02/19/2014  . Lung nodule, solitary 06/01/2013  . Elevated LFTs 09/11/2011  . CAD (coronary artery disease)   . Chest pain 09/15/2010  . Hyperlipidemia 09/15/2010  . Hypertension 09/15/2010    Past Medical History: Past Medical History:  Diagnosis Date  . Arthritis   . CAD (coronary artery disease)    a. s/p NSTEMI 10/12: LHC 04/02/11: mLAD 95%, EF 60%; PCI: Promus DES to mLAD. b. LHC 04/29/11:  LAD stent patent, proximal RCA 25%, EF 65%  c. 11/2015 - NSTEMI: cath w/ 99% stenosis prox LAD (DES placed) and 90% stenosis D1 (jailed by old stent, POBA alone performed), pRCA 40, EF > 65%  . Carotid stenosis 2015   a. Carotid US 8/15: 40-59% bilateral ICA stenosis  //   b. Carotid US 7/17: Stable 40-59% bilateral ICA stenosis. FU  1 year  . GERD (gastroesophageal reflux disease)   . HBV (hepatitis B virus) infection   . Hiatal hernia   . History of echocardiogram    a. Echo 6/17:  EF 60-65%, mild anterior HK, grade 1 diastolic dysfunction, mild TR, PASP 23 mmHg  . History of noncompliance with medical treatment   . HLD (hyperlipidemia)   . HTN (hypertension)    a. RA duplex 11/15: norma caliber aorta, norma RA bilaterally   . Lung nodule    a. Pt initially refused further workup. b. Per pulm note, resolved 02/19/14 and never smoker so no further f/u needed.  . Multinodular thyroid   . PVC's (premature ventricular contractions)     Past Surgical History: Past Surgical History:  Procedure Laterality Date  . BIOPSY THYROID     "negative"  . CARDIAC CATHETERIZATION N/A 11/30/2015   Procedure: Left Heart Cath and Coronary Angiography;  Surgeon: Burnell Blanks, MD;  Location: [facility];  Service: Cardiovascular;  Laterality: N/A;  . CESAREAN SECTION  1985  . CORONARY ANGIOPLASTY WITH STENT PLACEMENT  2012; 11/30/2015  . EXCISIONAL HEMORRHOIDECTOMY  1999  . LIPOMA EXCISION Right ~ 2015   "popliteal"  . TONSILLECTOMY  Social History: Social History  Substance Use Topics  . Smoking status: Never Smoker  . Smokeless tobacco: Never Used  . Alcohol use No    Please also refer to relevant sections of EMR.  Family History: Family History  Problem Relation Age of Onset  . Prostate cancer Father   . Cancer Father 16       bladder cancer  . Alcohol abuse Father   . Diabetes Sister   . Diabetes Sister   . Heart attack Brother   . Stroke Neg Hx     Allergies and Medications: Allergies  Allergen Reactions  . Atorvastatin Other (See Comments)    Muscle pain  . Crestor [Rosuvastatin] Other (See Comments)    Muscle pain  . Ibuprofen     REACTION: rapid heart rate  . Pravastatin Sodium Other (See Comments)    Muscle cramps/ confusion   No current facility-administered medications on  file prior to encounter.    Current Outpatient Prescriptions on File Prior to Encounter  Medication Sig Dispense Refill  . clopidogrel (PLAVIX) 75 MG tablet Take 1 tablet (75 mg total) by mouth daily with breakfast. 90 tablet 3  . losartan (COZAAR) 25 MG tablet Take 1 tablet (25 mg total) by mouth daily. 90 tablet 3  . nitroGLYCERIN (NITROSTAT) 0.4 MG SL tablet PLACE 1 TABLET (0.4 MG TOTAL) UNDER THE TONGUE EVERY 5 (FIVE) MINUTES AS NEEDED FOR CHEST PAIN. 25 tablet 1    Objective: BP 140/64   Pulse 66   Temp 97.6 F (36.4 C) (Oral)   Resp 18   Ht 4\' 9"  (1.448 m)   Wt 93 lb (42.2 kg)   SpO2 100%   BMI 20.13 kg/m  Exam: General: Elderly female laying in bed in no acute distress Eyes: Pupils equal and reactive to light, nonicteric sclera ENTM: slightly dry mucous membranes Neck: no JVD, no lymphadenopathy  Cardiovascular: RRR, normal s1 s2, no murmurs appreciated, 2+ DP pulses bilaterally, no edema Respiratory: Normal work of breathing, clear to auscultation bilaterally  Gastrointestinal: soft, non distended, non tender, normal bowel sounds, well healed midline incisional scar from C-Section  MSK: moves all extremities spontaneously  Derm: no rashes  Psych: normal mood and affect Neurologic Exam  Mental Status: alert; oriented to person, place and year; knowledge is normal for age; language is normal  Cranial Nerves: CN 2-12 intact Motor: Normal strength, tone and mass; good fine motor movements Sensory: grossly intact thoughout Coordination: no pronator drift  Gait and Station: gait not observed Reflexes: no clonus   Labs and Imaging: CBC BMET   Recent Labs Lab 02/08/17 1016  WBC 10.2  HGB 12.9  HCT 38.7  PLT 383    Recent Labs Lab 02/08/17 1016  NA 136  K 4.0  CL 102  CO2 25  BUN 6  CREATININE 0.85  GLUCOSE 131*  CALCIUM 8.9     Dg Chest 2 View  Result Date: 02/08/2017 CLINICAL DATA:  Lightheadedness and dizziness elevated syncope. EXAM: CHEST  2  VIEW COMPARISON:  11/28/2015. FINDINGS: Trachea is midline. Heart size stable. Thoracic aorta is calcified. Lungs are clear. No pleural fluid. IMPRESSION: No acute findings. Electronically Signed   By: Lorin Picket M.D.   On: 02/08/2017 12:56   Ct Head Wo Contrast  Result Date: 02/08/2017 CLINICAL DATA:  Initial evaluation for acute posttraumatic headache, dizziness. EXAM: CT HEAD WITHOUT CONTRAST TECHNIQUE: Contiguous axial images were obtained from the base of the skull through the vertex without intravenous contrast.  COMPARISON:  Prior CT from 11/15/2010. FINDINGS: Brain: Generalized age related cerebral atrophy with mild chronic small vessel ischemic disease. No acute intracranial hemorrhage. No evidence for acute large vessel territory infarct. No mass lesion, midline shift or mass effect. No hydrocephalus. No extra-axial fluid collection. Vascular: No hyperdense vessel. Scattered vascular calcifications noted within the carotid siphons. Skull: Small soft tissue contusion at the left frontal parietal scalp. Calvarium intact. Sinuses/Orbits: Visualized globes and orbital soft tissues within normal limits. Paranasal sinuses and mastoid air cells are clear. IMPRESSION: 1. No acute intracranial process. 2. Small left frontoparietal scalp contusion. 3. Generalized age-related cerebral atrophy with mild chronic small vessel ischemic disease. Electronically Signed   By: Jeannine Boga M.D.   On: 02/08/2017 12:57    Carlyle Dolly, MD 02/08/2017, 1:24 PM PGY-3, [facility] pager: [phone], text pages welcome

## 2017-02-09 ENCOUNTER — Encounter (HOSPITAL_COMMUNITY): Payer: Self-pay | Admitting: Cardiology

## 2017-02-09 ENCOUNTER — Observation Stay (HOSPITAL_BASED_OUTPATIENT_CLINIC_OR_DEPARTMENT_OTHER): Payer: Medicare Other

## 2017-02-09 DIAGNOSIS — R55 Syncope and collapse: Secondary | ICD-10-CM

## 2017-02-09 LAB — COMPREHENSIVE METABOLIC PANEL
ALK PHOS: 51 U/L (ref 38–126)
ALT: 31 U/L (ref 14–54)
ANION GAP: 9 (ref 5–15)
AST: 38 U/L (ref 15–41)
Albumin: 3.8 g/dL (ref 3.5–5.0)
BUN: 9 mg/dL (ref 6–20)
CALCIUM: 8.9 mg/dL (ref 8.9–10.3)
CO2: 24 mmol/L (ref 22–32)
Chloride: 105 mmol/L (ref 101–111)
Creatinine, Ser: 0.75 mg/dL (ref 0.44–1.00)
GFR calc Af Amer: >60 mL/min (ref >60)
Glucose, Bld: 86 mg/dL (ref 65–99)
Potassium: 3.9 mmol/L (ref 3.5–5.1)
SODIUM: 138 mmol/L (ref 135–145)
TOTAL PROTEIN: 7 g/dL (ref 6.5–8.1)
Total Bilirubin: 1.1 mg/dL (ref 0.3–1.2)

## 2017-02-09 LAB — ECHOCARDIOGRAM COMPLETE
AVPHT: 405 ms
Area-P 1/2: 4.31 cm2
E/e' ratio: 11.39
EWDT: 173 ms
FS: 38 % (ref 28–44)
Height: 57 in
IVS/LV PW RATIO, ED: 0.82
LA diam index: 2.31 cm/m2
LASIZE: 30 mm
LAVOL: 28.8 mL
LAVOLA4C: 27.8 mL
LAVOLIN: 22.1 mL/m2
LEFT ATRIUM END SYS DIAM: 30 mm
LV E/e' medial: 11.39
LV E/e'average: 11.39
LV PW d: 10.2 mm — AB (ref 0.6–1.1)
LV TDI E'MEDIAL: 11.3
LVELAT: 10.1 cm/s
LVOT area: 2.01 cm2
LVOTD: 16 mm
Lateral S' vel: 9.68 cm/s
MV Dec: 173
MV Peak grad: 5 mmHg
MV pk E vel: 115 m/s
MVPKAVEL: 79.5 m/s
P 1/2 time: 51 ms
RV TAPSE: 22 mm
TDI e' lateral: 10.1
Weight: 1480 oz

## 2017-02-09 LAB — VAS US CAROTID
LEFT ECA DIAS: -7 cm/s
LEFT VERTEBRAL DIAS: 11 cm/s
LICADDIAS: -15 cm/s
LICADSYS: -85 cm/s
LICAPDIAS: -27 cm/s
Left CCA dist dias: 20 cm/s
Left CCA dist sys: 100 cm/s
Left CCA prox dias: 17 cm/s
Left CCA prox sys: 102 cm/s
Left ICA prox sys: -87 cm/s
RCCADSYS: -75 cm/s
RCCAPSYS: -81 cm/s
RIGHT ECA DIAS: 7 cm/s
RIGHT VERTEBRAL DIAS: 17 cm/s
Right CCA prox dias: -17 cm/s

## 2017-02-09 LAB — TROPONIN I: Troponin I: <0.03 ng/mL (ref <0.03)

## 2017-02-09 LAB — CBC
HCT: 38.6 % (ref 36.0–46.0)
HEMOGLOBIN: 12.7 g/dL (ref 12.0–15.0)
MCH: 30.8 pg (ref 26.0–34.0)
MCHC: 32.9 g/dL (ref 30.0–36.0)
MCV: 93.5 fL (ref 78.0–100.0)
Platelets: 411 10*3/uL — ABNORMAL HIGH (ref 150–400)
RBC: 4.13 MIL/uL (ref 3.87–5.11)
RDW: 12.1 % (ref 11.5–15.5)
WBC: 6.6 10*3/uL (ref 4.0–10.5)

## 2017-02-09 LAB — GLUCOSE, CAPILLARY: GLUCOSE-CAPILLARY: 106 mg/dL — AB (ref 65–99)

## 2017-02-09 NOTE — Progress Notes (Signed)
OT Cancellation Note  Patient Details Name: [name] MRN: [id number] DOB: March 21, 1946   Cancelled Treatment:    Reason Eval/Treat Not Completed: Other (comment) (with PT.) Will reattempt as schedule permits.    Tyrone Schimke OTR/L Pager: [phone]  02/09/2017, 9:13 AM

## 2017-02-09 NOTE — Consult Note (Signed)
Primary cardiologist: Dr Burt Knack  HPI: 71 year old female with past medical history of coronary artery disease with prior PCI of LAD, hypertension, hyperlipidemia, carotid stenosis for evaluation of syncope at request of Smitty Cords, MD. Patient has had previous PCI of her LAD. She was admitted June 2017 with non-ST elevation myocardial infarction. She had cardiac catheterization revealed a 99% proximal LAD and a 90% stenosis in the diagonal jailed by her previous stent. She had PCI of the LAD with drug-eluting stent and angioplasty of her diagonal. Last echocardiogram June 2017 showed normal LV function, grade 1 diastolic dysfunction. She has discontinued multiple medications in the past due to side effects. She has had increased liver functions in the past and therefore statins have not been added. She apparently declined to see gastroenterology. Patient states she has not had dyspnea on exertion, orthopnea, PND, pedal edema, palpitations or exertional chest pain. Yesterday morning she was working in her yard. She felt fatigued and then became dizzy. She had frank syncope with loss of consciousness for approximately 3-4 seconds by her report. She did strike her head. There was no preceding nausea, dyspnea, diaphoresis, chest pain or palpitations. She has had no symptoms since. She was admitted and cardiology asked to evaluate.  Medications Prior to Admission  Medication Sig Dispense Refill  . clopidogrel (PLAVIX) 75 MG tablet Take 1 tablet (75 mg total) by mouth daily with breakfast. 90 tablet 3  . losartan (COZAAR) 25 MG tablet Take 1 tablet (25 mg total) by mouth daily. 90 tablet 3  . nitroGLYCERIN (NITROSTAT) 0.4 MG SL tablet PLACE 1 TABLET (0.4 MG TOTAL) UNDER THE TONGUE EVERY 5 (FIVE) MINUTES AS NEEDED FOR CHEST PAIN. 25 tablet 1    Allergies  Allergen Reactions  . Atorvastatin Other (See Comments)    Muscle pain  . Crestor [Rosuvastatin] Other (See Comments)    Muscle pain  .  Pravastatin Sodium Other (See Comments)    Muscle cramps/ confusion  . Ibuprofen Palpitations    REACTION: rapid heart rate     Past Medical History:  Diagnosis Date  . Arthritis   . CAD (coronary artery disease)    a. s/p NSTEMI 10/12: LHC 04/02/11: mLAD 95%, EF 60%; PCI: Promus DES to mLAD. b. LHC 04/29/11:  LAD stent patent, proximal RCA 25%, EF 65%  c. 11/2015 - NSTEMI: cath w/ 99% stenosis prox LAD (DES placed) and 90% stenosis D1 (jailed by old stent, POBA alone performed), pRCA 40, EF > 65%  . Carotid stenosis 2015   a. Carotid US 8/15: 40-59% bilateral ICA stenosis  //   b. Carotid US 7/17: Stable 40-59% bilateral ICA stenosis. FU 1 year  . GERD (gastroesophageal reflux disease)   . HBV (hepatitis B virus) infection   . Hiatal hernia   . History of echocardiogram    a. Echo 6/17:  EF 60-65%, mild anterior HK, grade 1 diastolic dysfunction, mild TR, PASP 23 mmHg  . History of noncompliance with medical treatment   . HLD (hyperlipidemia)   . HTN (hypertension)    a. RA duplex 11/15: norma caliber aorta, norma RA bilaterally   . Lung nodule    a. Pt initially refused further workup. b. Per pulm note, resolved 02/19/14 and never smoker so no further f/u needed.  . Multinodular thyroid   . PVC's (premature ventricular contractions)     Past Surgical History:  Procedure Laterality Date  . BIOPSY THYROID     "negative"  . CARDIAC CATHETERIZATION N/A 11/30/2015  Procedure: Left Heart Cath and Coronary Angiography;  Surgeon: Burnell Blanks, MD;  Location: [facility];  Service: Cardiovascular;  Laterality: N/A;  . CESAREAN SECTION  1985  . CORONARY ANGIOPLASTY WITH STENT PLACEMENT  2012; 11/30/2015  . EXCISIONAL HEMORRHOIDECTOMY  1999  . LIPOMA EXCISION Right ~ 2015   "popliteal"  . TONSILLECTOMY      Social History   Social History  . Marital status: Married    Spouse name: N/A  . Number of children: 1  . Years of education: N/A   Occupational History    . 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   Social History Main Topics  . Smoking status: Never Smoker  . Smokeless tobacco: Never Used  . Alcohol use No  . Drug use: No  . Sexual activity: No   Other Topics Concern  . Not on file   Social History Narrative   Marital status: married x 31 years.  From [place]; to [place] 1984.      Children:  1 child (30); no grandchildren.      Lives: with husband.      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      Tobacco: never      Alcohol: none       Exercise: works in the garden.     Family History  Problem Relation Age of Onset  . Prostate cancer Father   . Cancer Father 50       bladder cancer  . Alcohol abuse Father   . Diabetes Sister   . Diabetes Sister   . Heart attack Brother   . Stroke Neg Hx     ROS:  no fevers or chills, productive cough, hemoptysis, dysphasia, odynophagia, melena, hematochezia, dysuria, hematuria, rash, seizure activity, orthopnea, PND, pedal edema, claudication. Remaining systems are negative.  Physical Exam:   Blood pressure (!) 123/53, pulse 71, temperature 97.7 F (36.5 C), temperature source Oral, resp. rate 18, height '4\' 9"'  (1.448 m), weight 42 kg (92 lb 8 oz), SpO2 100 %.  General:  Well developed/well nourished in NAD Skin warm/dry Patient not depressed No peripheral clubbing Back-normal HEENT: Small laceration left frontal area/normal eyelids Neck supple/normal carotid upstroke bilaterally; no bruits; no JVD; no thyromegaly chest - CTA/ normal expansion CV - RRR/normal S1 and S2; no rubs or gallops;  PMI nondisplaced; 2/6 systolic murmur left sternal border. S2 is not diminished. Abdomen -NT/ND, no HSM, no mass, + bowel sounds, no bruit 2+ femoral pulses, no bruits Ext-no edema, chords, 2+ DP Neuro-grossly nonfocal  ECG -normal sinus rhythm, nonspecific ST changes . personally reviewed  Results for orders placed or performed during the hospital encounter of 02/08/17  (from the past 48 hour(s))  CBG monitoring, ED     Status: Abnormal   Collection Time: 02/08/17 10:15 AM  Result Value Ref Range   Glucose-Capillary 122 (H) 65 - 99 mg/dL  Basic metabolic panel     Status: Abnormal   Collection Time: 02/08/17 10:16 AM  Result Value Ref Range   Sodium 136 135 - 145 mmol/L   Potassium 4.0 3.5 - 5.1 mmol/L   Chloride 102 101 - 111 mmol/L   CO2 25 22 - 32 mmol/L   Glucose, Bld 131 (H) 65 - 99 mg/dL   BUN 6 6 - 20 mg/dL   Creatinine, Ser 0.85 0.44 - 1.00 mg/dL   Calcium 8.9 8.9 - 10.3 mg/dL   GFR calc non Af Amer >60 >60  mL/min   GFR calc Af Amer >60 >60 mL/min    Comment: (NOTE) The eGFR has been calculated using the CKD EPI equation. This calculation has not been validated in all clinical situations. eGFR's persistently <60 mL/min signify possible Chronic Kidney Disease.    Anion gap 9 5 - 15  CBC     Status: None   Collection Time: 02/08/17 10:16 AM  Result Value Ref Range   WBC 10.2 4.0 - 10.5 K/uL   RBC 4.12 3.87 - 5.11 MIL/uL   Hemoglobin 12.9 12.0 - 15.0 g/dL   HCT 38.7 36.0 - 46.0 %   MCV 93.9 78.0 - 100.0 fL   MCH 31.3 26.0 - 34.0 pg   MCHC 33.3 30.0 - 36.0 g/dL   RDW 12.0 11.5 - 15.5 %   Platelets 383 150 - 400 K/uL  I-Stat Troponin, ED (not at [facility])     Status: None   Collection Time: 02/08/17 10:29 AM  Result Value Ref Range   Troponin i, poc 0.01 0.00 - 0.08 ng/mL   Comment 3            Comment: Due to the release kinetics of cTnI, a negative result within the first hours of the onset of symptoms does not rule out myocardial infarction with certainty. If myocardial infarction is still suspected, repeat the test at appropriate intervals.   Urinalysis, Routine w reflex microscopic     Status: Abnormal   Collection Time: 02/08/17  2:50 PM  Result Value Ref Range   Color, Urine STRAW (A) YELLOW   APPearance CLEAR CLEAR   Specific Gravity, Urine 1.005 1.005 - 1.030   pH 7.0 5.0 - 8.0   Glucose, UA NEGATIVE NEGATIVE mg/dL    Hgb urine dipstick NEGATIVE NEGATIVE   Bilirubin Urine NEGATIVE NEGATIVE   Ketones, ur NEGATIVE NEGATIVE mg/dL   Protein, ur NEGATIVE NEGATIVE mg/dL   Nitrite NEGATIVE NEGATIVE   Leukocytes, UA NEGATIVE NEGATIVE  Troponin I     Status: None   Collection Time: 02/08/17  7:17 PM  Result Value Ref Range   Troponin I <0.03 <0.03 ng/mL  TSH     Status: None   Collection Time: 02/08/17  7:17 PM  Result Value Ref Range   TSH 0.914 0.350 - 4.500 uIU/mL    Comment: Performed by a 3rd Generation assay with a functional sensitivity of <=0.01 uIU/mL.  Lipid panel     Status: Abnormal   Collection Time: 02/08/17  7:17 PM  Result Value Ref Range   Cholesterol 175 0 - 200 mg/dL   Triglycerides 88 <150 mg/dL   HDL 51 >40 mg/dL   Total CHOL/HDL Ratio 3.4 RATIO   VLDL 18 0 - 40 mg/dL   LDL Cholesterol 106 (H) 0 - 99 mg/dL    Comment:        Total Cholesterol/HDL:CHD Risk Coronary Heart Disease Risk Table                     Men   Women  1/2 Average Risk   3.4   3.3  Average Risk       5.0   4.4  2 X Average Risk   9.6   7.1  3 X Average Risk  23.4   11.0        Use the calculated Patient Ratio above and the CHD Risk Table to determine the patient's CHD Risk.        ATP III CLASSIFICATION (LDL):  <  100     mg/dL   Optimal  100-129  mg/dL   Near or Above                    Optimal  130-159  mg/dL   Borderline  160-189  mg/dL   High  >190     mg/dL   Very High   Hemoglobin A1c     Status: None   Collection Time: 02/08/17  7:17 PM  Result Value Ref Range   Hgb A1c MFr Bld 5.1 4.8 - 5.6 %    Comment: (NOTE) Pre diabetes:          5.7%-6.4% Diabetes:              >6.4% Glycemic control for   <7.0% adults with diabetes    Mean Plasma Glucose 99.67 mg/dL  Troponin I     Status: None   Collection Time: 02/09/17 12:03 AM  Result Value Ref Range   Troponin I <0.03 <0.03 ng/mL  Troponin I     Status: None   Collection Time: 02/09/17  6:15 AM  Result Value Ref Range   Troponin I <0.03  <0.03 ng/mL  CBC     Status: Abnormal   Collection Time: 02/09/17  6:15 AM  Result Value Ref Range   WBC 6.6 4.0 - 10.5 K/uL   RBC 4.13 3.87 - 5.11 MIL/uL   Hemoglobin 12.7 12.0 - 15.0 g/dL   HCT 38.6 36.0 - 46.0 %   MCV 93.5 78.0 - 100.0 fL   MCH 30.8 26.0 - 34.0 pg   MCHC 32.9 30.0 - 36.0 g/dL   RDW 12.1 11.5 - 15.5 %   Platelets 411 (H) 150 - 400 K/uL  Comprehensive metabolic panel     Status: None   Collection Time: 02/09/17  6:15 AM  Result Value Ref Range   Sodium 138 135 - 145 mmol/L   Potassium 3.9 3.5 - 5.1 mmol/L   Chloride 105 101 - 111 mmol/L   CO2 24 22 - 32 mmol/L   Glucose, Bld 86 65 - 99 mg/dL   BUN 9 6 - 20 mg/dL   Creatinine, Ser 0.75 0.44 - 1.00 mg/dL   Calcium 8.9 8.9 - 10.3 mg/dL   Total Protein 7.0 6.5 - 8.1 g/dL   Albumin 3.8 3.5 - 5.0 g/dL   AST 38 15 - 41 U/L   ALT 31 14 - 54 U/L   Alkaline Phosphatase 51 38 - 126 U/L   Total Bilirubin 1.1 0.3 - 1.2 mg/dL   GFR calc non Af Amer >60 >60 mL/min   GFR calc Af Amer >60 >60 mL/min    Comment: (NOTE) The eGFR has been calculated using the CKD EPI equation. This calculation has not been validated in all clinical situations. eGFR's persistently <60 mL/min signify possible Chronic Kidney Disease.    Anion gap 9 5 - 15  Glucose, capillary     Status: Abnormal   Collection Time: 02/09/17  7:42 AM  Result Value Ref Range   Glucose-Capillary 106 (H) 65 - 99 mg/dL   Comment 1 Notify RN     Dg Chest 2 View  Result Date: 02/08/2017 CLINICAL DATA:  Lightheadedness and dizziness elevated syncope. EXAM: CHEST  2 VIEW COMPARISON:  11/28/2015. FINDINGS: Trachea is midline. Heart size stable. Thoracic aorta is calcified. Lungs are clear. No pleural fluid. IMPRESSION: No acute findings. Electronically Signed   By: Lorin Picket M.D.   On: 02/08/2017 12:56  Ct Head Wo Contrast  Result Date: 02/08/2017 CLINICAL DATA:  Initial evaluation for acute posttraumatic headache, dizziness. EXAM: CT HEAD WITHOUT CONTRAST  TECHNIQUE: Contiguous axial images were obtained from the base of the skull through the vertex without intravenous contrast. COMPARISON:  Prior CT from 11/15/2010. FINDINGS: Brain: Generalized age related cerebral atrophy with mild chronic small vessel ischemic disease. No acute intracranial hemorrhage. No evidence for acute large vessel territory infarct. No mass lesion, midline shift or mass effect. No hydrocephalus. No extra-axial fluid collection. Vascular: No hyperdense vessel. Scattered vascular calcifications noted within the carotid siphons. Skull: Small soft tissue contusion at the left frontal parietal scalp. Calvarium intact. Sinuses/Orbits: Visualized globes and orbital soft tissues within normal limits. Paranasal sinuses and mastoid air cells are clear. IMPRESSION: 1. No acute intracranial process. 2. Small left frontoparietal scalp contusion. 3. Generalized age-related cerebral atrophy with mild chronic small vessel ischemic disease. Electronically Signed   By: Jeannine Boga M.D.   On: 02/08/2017 12:57    Assessment/Plan 1 syncope-etiology unclear. No preceding cardiac symptoms including no chest pain, palpitations or dyspnea. Symptoms do not suggest vagal etiology. She was working in the heat and may have simply been dehydrated. I have reviewed her telemetry personally. She has had no significant arrhythmias. Await echocardiogram. If LV function remains normal she can be discharged from a cardiac standpoint and we will arrange outpatient 30 day event monitor. She will then need follow-up with Dr. Burt Knack. I have instructed her not to drive for 6 months following her recent syncopal episode.  2 coronary artery disease-continue Plavix. Apparently she did not want to take aspirin and Plavix together and was on Plavix alone prior to admission. She is not on a statin because she has had increased liver functions in the past.  3 hypertension-would resume preadmission dose of Cozaar at  discharge.  4 hyperlipidemia-continue diet. She is not on a statin because of increased liver functions and declined Zetia in the past.  Sign off. Please call with questions.   Kirk Ruths MD 02/09/2017, 3:13 PM

## 2017-02-09 NOTE — Discharge Instructions (Signed)
You were admitted to the hospital for an episode of syncope. Your troponins and EKGs all look good which means this event was unlikely related to your heart. We had the heart specialist come and see you and they would like for you to have an outpatient 30 day cardiac event monitor, they will call you to set this up. Please follow up with Dr. Burt Knack within the next week if possible.   Please come back to the hospital if you have any other episodes of passing out.   Take care!   Syncope Syncope is when you temporarily lose consciousness. Syncope may also be called fainting or passing out. It is caused by a sudden decrease in blood flow to the brain. Even though most causes of syncope are not dangerous, syncope can be a sign of a serious medical problem. Signs that you may be about to faint include:  Feeling dizzy or light-headed.  Feeling nauseous.  Seeing all white or all black in your field of vision.  Having cold, clammy skin.  If you fainted, get medical help right away.Call your local emergency services (911 in the [place] Do not drive yourself to the hospital. Follow these instructions at home: Pay attention to any changes in your symptoms. Take these actions to help with your condition:  Have someone stay with you until you feel stable.  Do not drive, use machinery, or play sports until your health care provider says it is okay.  Keep all follow-up visits as told by your health care provider. This is important.  If you start to feel like you might faint, lie down right away and raise (elevate) your feet above the level of your heart. Breathe deeply and steadily. Wait until all of the symptoms have passed.  Drink enough fluid to keep your urine clear or pale yellow.  If you are taking blood pressure or heart medicine, get up slowly and take several minutes to sit and then stand. This can reduce dizziness.  Take over-the-counter and prescription medicines only as told by your  health care provider.  Get help right away if:  You have a severe headache.  You have unusual pain in your chest, abdomen, or back.  You are bleeding from your mouth or rectum, or you have black or tarry stool.  You have a very fast or irregular heartbeat (palpitations).  You have pain with breathing.  You faint once or repeatedly.  You have a seizure.  You are confused.  You have trouble walking.  You have severe weakness.  You have vision problems. These symptoms may represent a serious problem that is an emergency. Do not wait to see if your symptoms will go away. Get medical help right away. Call your local emergency services (911 in the [place] Do not drive yourself to the hospital. This information is not intended to replace advice given to you by your health care provider. Make sure you discuss any questions you have with your health care provider. Document Released: 05/30/2005 Document Revised: 11/05/2015 Document Reviewed: 02/11/2015 Elsevier Interactive Patient Education  2017 [employer].

## 2017-02-09 NOTE — Progress Notes (Signed)
OT Cancellation Note  Patient Details Name: [name] MRN: [id number] DOB: 1946/01/15   Cancelled Treatment:    Reason Eval/Treat Not Completed: OT screened, no needs identified, will sign off. Per conversation with PT and pt, pt at baseline with ADLs.   Tyrone Schimke OTR/L Pager: [phone]  02/09/2017, 10:22 AM

## 2017-02-09 NOTE — Progress Notes (Signed)
*  PRELIMINARY RESULTS* Vascular Ultrasound Carotid Duplex (Doppler) has been completed.  Preliminary findings: 1-39% Bilateral ICA stenosis. Antegrade vertebral flow.   Landry Mellow, RDMS, RVT  02/09/2017, 10:22 AM

## 2017-02-09 NOTE — Evaluation (Addendum)
Physical Therapy Evaluation and Discharge Patient Details Name: [name] MRN: [id number] DOB: 1945-09-15 Today's Date: 02/09/2017   History of Present Illness  Pt is a 71 yo female admitted through ED after syncopal episode while gardening. Syncope possibly result of hypotension due to antihypertensive medications. Pt with PMH of HTN, HLD, CAD, B/L Carotid stenosis.   Clinical Impression  Patient evaluated by Physical Therapy with no further acute PT needs identified. All education has been completed and the patient has no further questions. Pt is independent with bed mobility, transfers and ambulation of 300 feet without AD. See below for any follow-up Physical Therapy or equipment needs. PT is signing off. Thank you for this referral.     Follow Up Recommendations No PT follow up    Equipment Recommendations  None recommended by PT       Precautions / Restrictions Precautions Precautions: Fall Restrictions Weight Bearing Restrictions: No      Mobility  Bed Mobility Overal bed mobility: Independent                Transfers Overall transfer level: Independent                  Ambulation/Gait Ambulation/Gait assistance: Independent Ambulation Distance (Feet): 300 Feet Assistive device: None Gait Pattern/deviations: WFL(Within Functional Limits) Gait velocity: WFL Gait velocity interpretation: >2.62 ft/sec, indicative of independent community ambulator General Gait Details: safe steady cadence, no dizziness, no lightheadedness, no obvious gait deviations      Balance Overall balance assessment: No apparent balance deficits (not formally assessed)                                           Pertinent Vitals/Pain Pain Assessment: No/denies pain    Home Living Family/patient expects to be discharged to:: Private residence Living Arrangements: Spouse/significant other Available Help at Discharge: Family;Available 24  hours/day Type of Home: House Home Access: Level entry     Home Layout: One level Home Equipment: None      Prior Function Level of Independence: Independent               Hand Dominance   Dominant Hand: Right    Extremity/Trunk Assessment   Upper Extremity Assessment Upper Extremity Assessment: Overall WFL for tasks assessed    Lower Extremity Assessment Lower Extremity Assessment: Overall WFL for tasks assessed    Cervical / Trunk Assessment Cervical / Trunk Assessment: Normal  Communication   Communication: No difficulties  Cognition Arousal/Alertness: Awake/alert Behavior During Therapy: WFL for tasks assessed/performed Overall Cognitive Status: Within Functional Limits for tasks assessed                                        General Comments General comments (skin integrity, edema, etc.): VSS, pt husband present for session        Assessment/Plan    PT Assessment Patent does not need any further PT services  PT Problem List         PT Treatment Interventions      PT Goals (Current goals can be found in the Care Plan section)  Acute Rehab PT Goals Patient Stated Goal: go home     AM-PAC PT "6 Clicks" Daily Activity  Outcome Measure Difficulty turning over in bed (including adjusting bedclothes,  sheets and blankets)?: None Difficulty moving from lying on back to sitting on the side of the bed? : None Difficulty sitting down on and standing up from a chair with arms (e.g., wheelchair, bedside commode, etc,.)?: None Help needed moving to and from a bed to chair (including a wheelchair)?: None Help needed walking in hospital room?: None Help needed climbing 3-5 steps with a railing? : None 6 Click Score: 24    End of Session   Activity Tolerance: Patient tolerated treatment well Patient left: in bed;with call bell/phone within reach;with family/visitor present Nurse Communication: Mobility status      Time: 1497-0263 PT  Time Calculation (min) (ACUTE ONLY): 18 min   Charges:   PT Evaluation $PT Eval Moderate Complexity: 1 Mod     PT G Codes:   PT G-Codes **NOT FOR INPATIENT CLASS** Functional Assessment Tool Used: AM-PAC 6 Clicks Basic Mobility Functional Limitation: Mobility: Walking and moving around Mobility: Walking and Moving Around Current Status (Z8588): 0 percent impaired, limited or restricted Mobility: Walking and Moving Around Goal Status (F0277): 0 percent impaired, limited or restricted Mobility: Walking and Moving Around Discharge Status (A1287): 0 percent impaired, limited or restricted    Benjamine Mola B. Migdalia Dk PT, DPT Acute Rehabilitation  [phone] Pager [phone]    [place] 02/09/2017, 9:41 AM

## 2017-02-09 NOTE — Discharge Summary (Signed)
[name]  Patient name: [name] Medical record number: [id number] Date of birth: 1945/06/17 Age: 71 y.o. Gender: female Date of Admission: 02/08/2017  Date of Discharge: 02/09/17 Admitting Physician: Kinnie Feil, MD  Primary Care Provider: Leandrew Koyanagi, MD (Inactive) Consultants: Cardiology   Indication for Hospitalization: syncope  Discharge Diagnoses/Problem List:  Patient Active Problem List   Diagnosis Date Noted  . Head injury   . Syncope   . Status post coronary artery stent placement   . History of non-ST elevation myocardial infarction (NSTEMI) 11/28/2015  . PVC's (premature ventricular contractions)   . Bilateral carotid artery disease ([place])   . Upper airway cough syndrome 02/19/2014  . Lung nodule, solitary 06/01/2013  . Elevated LFTs 09/11/2011  . CAD (coronary artery disease)   . Chest pain 09/15/2010  . Hyperlipidemia 09/15/2010  . Hypertension 09/15/2010    Disposition: home   Discharge Condition: stable  Discharge Exam: see previous progress note   Brief Hospital Course:  [name] P Delaneyis a 67 y.o.femalepresenting with a syncopal episode. PMH significant for NSTEMI x 2 w/ stents, CAD, HTN, HLD, B/L carotid disease, increased LFT.   Patient admitted for a syncopal episode after gardening. Upon presentation to ED all of her vitals were stable. CT of her head was negative for any CVA or intracranial bleeding. She did have a superficial hematoma on the left side of her head where she hit the ground. CBC and CMP unremarkable. Patient was placed on telemetry and maintained normal sinus rhythm with no ST changes. Troponins trended and negative 3. Carotid ultrasounds showing no significant stenosis. Echocardiogram normal. Cardiology was consulted given cardiac risk factors. Cardiology discussed syncopal episode was likely from dehydration and that they would arrange a 30 day Holter monitor  for her. Upon discharge patient's vitals remained stable and orthostatic vitals negative.   Issues for Follow Up:  1. Syncope: Likely related to dehydration. Will need 30 day Holter monitor 2. Follow-up with outpatient cardiologist Dr. Burt Knack 3. LFTs normal here, could consider resumption of a statin if cardiologist/PCP feels it is appropriate and if patient is agreeable 4. Preadmission dose of Cozaar resumed at discharge, can DC this medication if patient hypotensive in outpatient setting.   Significant Procedures: none  Significant Labs and Imaging:   Recent Labs Lab 02/08/17 1016 02/09/17 0615  WBC 10.2 6.6  HGB 12.9 12.7  HCT 38.7 38.6  PLT 383 411*    Recent Labs Lab 02/08/17 1016 02/09/17 0615  NA 136 138  K 4.0 3.9  CL 102 105  CO2 25 24  GLUCOSE 131* 86  BUN 6 9  CREATININE 0.85 0.75  CALCIUM 8.9 8.9  ALKPHOS  --  51  AST  --  38  ALT  --  31  ALBUMIN  --  3.8    Echocardiogram:  Study Conclusions  - Left ventricle: The cavity size was normal. Systolic function was   normal. The estimated ejection fraction was in the range of 60%   to 65%. Wall motion was normal; there were no regional wall   motion abnormalities. Left ventricular diastolic function   parameters were normal. - Aortic valve: Transvalvular velocity was within the normal range.   There was no stenosis. There was mild regurgitation. - Mitral valve: Transvalvular velocity was within the normal range.   There was no evidence for stenosis. There was mild regurgitation. - Right ventricle: The cavity size was normal. Wall thickness was  normal. Systolic function was normal. - Atrial septum: No defect or patent foramen ovale was identified. - Tricuspid valve: There was no regurgitation.  Results/Tests Pending at Time of Discharge: None  Discharge Medications:  Allergies as of 02/09/2017      Reactions   Atorvastatin Other (See Comments)   Muscle pain   Crestor [rosuvastatin] Other (See  Comments)   Muscle pain   Pravastatin Sodium Other (See Comments)   Muscle cramps/ confusion   Ibuprofen Palpitations   REACTION: rapid heart rate      Medication List    TAKE these medications   clopidogrel 75 MG tablet Commonly known as:  PLAVIX Take 1 tablet (75 mg total) by mouth daily with breakfast.   losartan 25 MG tablet Commonly known as:  COZAAR Take 1 tablet (25 mg total) by mouth daily.   nitroGLYCERIN 0.4 MG SL tablet Commonly known as:  NITROSTAT PLACE 1 TABLET (0.4 MG TOTAL) UNDER THE TONGUE EVERY 5 (FIVE) MINUTES AS NEEDED FOR CHEST PAIN.            Discharge Care Instructions        Start     Ordered   02/09/17 0000  Increase activity slowly     02/09/17 1630   02/09/17 0000  Diet - low sodium heart healthy     02/09/17 1630      Discharge Instructions: Please refer to Patient Instructions section of EMR for full details.  Patient was counseled important signs and symptoms that should prompt return to medical care, changes in medications, dietary instructions, activity restrictions, and follow up appointments.   Follow-Up Appointments: Follow-up Information    Leandrew Koyanagi, MD. Schedule an appointment as soon as possible for a visit.   Specialties:  Internal Medicine, Adolescent Medicine       Sherren Mocha, MD. Schedule an appointment as soon as possible for a visit.   Specialty:  Cardiology Contact information: [address] [place] [address] [phone]           Follow-up Information    Leandrew Koyanagi, MD. Schedule an appointment as soon as possible for a visit.   Specialties:  Internal Medicine, Adolescent Medicine       Sherren Mocha, MD. Schedule an appointment as soon as possible for a visit.   Specialty:  Cardiology Contact information: [address] [place] [address] [phone]           Carlyle Dolly, MD 02/09/2017, 4:43 PM PGY-3, [place]

## 2017-02-09 NOTE — Progress Notes (Signed)
Family Medicine Medical Student Note Daily Progress Note Intern Pager: [phone]  Patient name: [name] Medical record number: [id number] Date of birth: February 21, 1946 Age: 71 y.o. Gender: female  Primary Care Provider: Leandrew Koyanagi, MD (Inactive) Consultants: None Code Status: Full  Pt Overview and Major Events to Date:   Assessment and Plan:  [name] is a 71 y.o. female presenting with a syncopal episode earlier today. PMH significant for NSTEMI x 2 w/ stents, CAD, HTN, HLD, B/L carotid disease, increased LFT  Syncope: No further episodes since admission. Vitals stable, negative orthostatics on admission. All labs normal including negative troponins and EKG. No signs of ACS event. Leading differentials still include orthostatic hypotension vs vasovagal episode vs dehydration (admits to not drinking a lot of water) vs arrhythmia.  -Vital signs per floor protocol  -continuous cardiac monitoring  -Echocardiogram - Will consult cardiology for potential loop recorder placement given cardiac risk factors  Hypertension: Hypotensive this AM -Holding Cozaar  CAD, NSTEMI x2 w/ stents, b/l carotid disease: Has been following with cardiology. No CP, SOB, or edema on exam. 7/17 carotid U/S showing stable 40-59% bilateral ICA stenosis. Cardiology wanted repeat carotid U/S last month but patient did not get it - Will repeat carotid U/S here while she is inpatient per cardiology recs - Continue home Plavix   HLD: Dec 2017 Lipid panel showing Cholesterol 177, HDL 40, Triglycerides 210. Per previous cardiology notes, patient intolerant of multiple statins due to muscle cramps (atorvastatin, crestor, pravastatin) and most recently she was taken off statin therapy due to increased LFTs. She was supposed to see GI for increased LFTs but refused to go. On admission she states she does not want to take any statins because it will give her diabetes. Counseled patient extensively  on admission about benefit of statin for her. Lipid Panel: total cholesterol 175, LDL 106, Triglycerides 88, HDL 51 - Hold statins because of patient refusal and hx of increased LFTs  Hx of increased LFTs: LFTs normal here  HFpEF: June 2017 Echo showing EF 60-65% G1DD. Does not appear to be having a heart failure exacerbation -Echo results pending  FEN/GI: SLIV, heart healthy Prophylaxis: SCDs  Disposition: Likely home after negative cardiologic work up  Subjective:  When entering [name]'s room, she was ambulating with normal gait and without dizziness. She stated that she did not have any acute overnight events. She felt better today and had just eaten breakfast. She appears in good spirits and without any new complaints.  Objective: Temp:  [97.6 F (36.4 C)-98.3 F (36.8 C)] 97.8 F (36.6 C) (08/30 0817) Pulse Rate:  [62-77] 69 (08/30 0817) Resp:  [15-22] 18 (08/30 0817) BP: (111-147)/(45-70) 115/45 (08/30 0817) SpO2:  [97 %-100 %] 100 % (08/30 0817) Weight:  [92 lb 8 oz (42 kg)-93 lb (42.2 kg)] 92 lb 8 oz (42 kg) (08/30 0557) Physical Exam: General: Elderly female appears calm, in no acute distress. Cardiovascular: Regular rate and rhythm, normal S1 and S2, without murmurs, rubs or gallops Respiratory: Normal work of breathing, clear to auscultation bilaterally Abdomen: Soft, nondistended, non tender,without masses, and normal bowel sounds. Previous lower abdominal scar from C-section that is well healed. Extremities: Normal motor and tone..  Neurology: Alert and oriented x3. Cranial nerves II-XII intact, strength and sensation grossly intact throughout.  Laboratory:  Recent Labs Lab 02/08/17 1016 02/09/17 0615  WBC 10.2 6.6  HGB 12.9 12.7  HCT 38.7 38.6  PLT 383 411*    Recent Labs Lab  02/08/17 1016 02/09/17 0615  NA 136 138  K 4.0 3.9  CL 102 105  CO2 25 24  BUN 6 9  CREATININE 0.85 0.75  CALCIUM 8.9 8.9  PROT  --  7.0  BILITOT  --  1.1   ALKPHOS  --  51  ALT  --  31  AST  --  38  GLUCOSE 131* 86    Imaging/Diagnostic Tests:   Ophelia Charter, MS4  RESIDENT ADDENDUM  I saw and evaluated the patient, performing the key elements of service. I developed the management plan that is described in the Medical Student's note, and I agree with the content, making changing as needed. My detailed findings are below.  Physical Exam:  BP (!) 115/45 (BP Location: Right Arm)   Pulse 69   Temp 97.8 F (36.6 C) (Oral)   Resp 18   Ht 4\' 9"  (1.448 m)   Wt 92 lb 8 oz (42 kg)   SpO2 100%   BMI 20.02 kg/m   General Appearance:   Sitting up in bed, in NAD  Lungs:   Clear to auscultation bilaterally, respirations unlabored, nor rales, rhonchi or wheezes  Heart:   Regular rate and rhythm, S1 and S2 normal, no murmurs, rubs, or gallops; Peripheral pulses present and normal throughout; Brisk capillary refill.  Abdomen:   Soft, non-tender, bowel sounds present, no mass, or organomegaly  Musculoskeletal:  Grossly normal age-appropriate movements, tone, and strength  Lymphatic:   No cervical adenopathy   Skin/Hair/Nails:   Skin warm, dry and intact  Neurologic:   Alert and oriented x 3    Smitty Cords, MD 02/09/2017, 12:40 PM PGY-3, [place]

## 2017-02-09 NOTE — Progress Notes (Signed)
  Echocardiogram 2D Echocardiogram has been performed.  [name] 02/09/2017, 12:19 PM

## 2017-02-14 ENCOUNTER — Ambulatory Visit: Payer: Medicare Other | Admitting: Physician Assistant

## 2017-02-15 ENCOUNTER — Ambulatory Visit: Payer: Medicare Other | Admitting: Physician Assistant

## 2017-03-13 ENCOUNTER — Ambulatory Visit: Payer: Medicare Other | Admitting: Cardiovascular Disease

## 2017-03-22 ENCOUNTER — Encounter: Payer: Self-pay | Admitting: Physician Assistant

## 2017-05-26 ENCOUNTER — Ambulatory Visit: Payer: Medicare Other | Admitting: Physician Assistant

## 2017-06-26 ENCOUNTER — Other Ambulatory Visit: Payer: Self-pay | Admitting: Cardiovascular Disease

## 2017-06-26 MED ORDER — NITROGLYCERIN 0.4 MG SL SUBL: 0.4000 mg | SUBLINGUAL_TABLET | SUBLINGUAL | 1 refills | Status: DC | PRN

## 2017-06-30 ENCOUNTER — Other Ambulatory Visit: Payer: Self-pay | Admitting: Physician Assistant

## 2017-06-30 NOTE — Telephone Encounter (Signed)
Medication Detail    Disp Refills Start End   nitroGLYCERIN (NITROSTAT) 0.4 MG SL tablet 75 tablet 1 06/26/2017    Sig - Route: Place 1 tablet (0.4 mg total) under the tongue every 5 (five) minutes as needed for chest pain. - Sublingual   Sent to pharmacy as: nitroGLYCERIN (NITROSTAT) 0.4 MG SL tablet   E-Prescribing Status: Receipt confirmed by pharmacy (06/26/2017 11:24 AM EST)   Pharmacy   [id number] - [place], [place].

## 2017-11-15 ENCOUNTER — Other Ambulatory Visit: Payer: Self-pay | Admitting: Physician Assistant

## 2017-12-17 ENCOUNTER — Other Ambulatory Visit: Payer: Self-pay | Admitting: Physician Assistant

## 2017-12-17 DIAGNOSIS — I10 Essential (primary) hypertension: Secondary | ICD-10-CM

## 2017-12-19 NOTE — Progress Notes (Signed)
Cardiology Office Note:    Date:  12/20/2017   ID:  [name], [name] [birth year], MRN [id number]  PCP:  Leandrew Koyanagi, MD (Inactive)  Cardiologist:  Sherren Mocha, MD   Referring MD: No ref. provider found   Chief Complaint  Patient presents with  . Follow-up    CAD; admx in 8/208 with syncope    History of Present Illness:    [name] is a 72 y.o. female with CAD s/p NSTEMI 04/2011 tx with DES to mid LAD, carotid stenosis, HTN, HL, GERD, PAC/PVCs.At the time of her MI, she was reluctant to start most medications. She has refused multiple medications in the past due to SEs/intolerances (beta blockers, statins, ASA, Diltiazem, Losartan).Relook heart catheterization in 11/12 done for continued chest pain and fatigue: LAD stent patent, proximal RCA 25%, EF 65%. She had a NSTEMI in 11/2015 treated with DES to the LAD for ISR and POBA to the Dx.  She stopped her ACE inhibitor after her MI due to side effects and I switched her to an angiotensin receptor blocker.    She has a hx of elevated LFTs. Abdominal ultrasound has demonstrated a dilated common bile duct. Hepatitis serologies have demonstrated continued seropositivity for hepatitis B surface antigen.  She was taken off statin Rx and referred to GI, but has never made an appointment.  Last seen in 11/2016.    She was admitted in 01/2017 for syncope.  Echo demonstrated normal LVF.  Event monitor was suggested but she never had this done.    Ms. Bulluck returns for follow-up.  She is here alone.  She tells me that, on the day she passed out, she had been working out in the garden.  It was August.  She has not had any further episodes of syncope.  She denies chest discomfort, shortness of breath, PND, leg swelling.  She stopped Plavix secondary to gum bleeding and epistaxis.  She is taking aspirin 81 mg daily.  I reviewed her notes.  Previously, we had recommended taking Plavix long-term as she does have multiple  stents in the same vessel.  However, she is not willing to take Plavix again.  Prior CV studies:   The following studies were reviewed today:  Carotid US 02/09/17 bilat ICA 1-39  Echo 02/09/17 EF 60-65, no RWMA, normal diastolic function, mild AI, mild MR  Carotid US 7/17:  Stable 40-59% bilateral ICA stenosis. FU 1 year  Echo 11/30/15 EF 60-65%, mild anterior HK, grade 1 diastolic dysfunction, PASP 23 mmHg  LHC 11/30/15 LAD proximal 99% involving the proximal to mid stent, D1 ostial 90% (jailed by stent) LCx okay RCA proximal 40% EF >65% PCI: 3 x 24 mm Synergy DES to the proximal LAD and POBA to the diagonal branch 1. Severe restenosis proximal LAD stent with involvement of the moderate caliber Diagonal branch which is jailed by the old stent.  2. Successful PTCA/DES x 1 proximal LAD 3. Successful PTCA/angioplasty only Diagonal branch (jailed by old stent) 4. NSTEMI 5. Normal LV systolic function Recommendations: Will continue ASA and Plavix for one year if she tolerates. She is intolerant of statins.   Cardiac Cath 04/2011 LAD: Stent in the proximal vessel is widely patent.  RCA: prox 20%  EF 65%   Carotid US 01/2014 Bilateral ICA 40-59%  Normal subclavian arteries, bilaterally. FU 1 year  Renal Artery Duplex 11/15 Normal caliber abdominal aorta. Normal and symmetrical kidney size. Normal renal arteries, bilaterally. The IVC and renal veins are  patent.  Past Medical History:  Diagnosis Date  . Arthritis   . CAD (coronary artery disease)    a. s/p NSTEMI 10/12: LHC 04/02/11: mLAD 95%, EF 60%; PCI: Promus DES to mLAD. b. LHC 04/29/11:  LAD stent patent, proximal RCA 25%, EF 65%  c. 11/2015 - NSTEMI: cath w/ 99% stenosis prox LAD (DES placed) and 90% stenosis D1 (jailed by old stent, POBA alone performed), pRCA 40, EF > 65%  . Carotid stenosis 2015   a. Carotid US 8/15: 40-59% bilateral ICA stenosis  //   b. Carotid US 7/17: Stable 40-59% bilateral ICA stenosis. FU  1 year  . GERD (gastroesophageal reflux disease)   . HBV (hepatitis B virus) infection   . Hiatal hernia   . History of echocardiogram    a. Echo 6/17:  EF 60-65%, mild anterior HK, grade 1 diastolic dysfunction, mild TR, PASP 23 mmHg  . History of noncompliance with medical treatment   . HLD (hyperlipidemia)   . HTN (hypertension)    a. RA duplex 11/15: norma caliber aorta, norma RA bilaterally   . Lung nodule    a. Pt initially refused further workup. b. Per pulm note, resolved 02/19/14 and never smoker so no further f/u needed.  . Multinodular thyroid   . PVC's (premature ventricular contractions)    Surgical Hx: The patient  has a past surgical history that includes Cesarean section (1985); Tonsillectomy; Excisional hemorrhoidectomy (1999); Coronary angioplasty with stent (2012; 11/30/2015); Lipoma excision (Right, ~ 2015); Biopsy thyroid; and Cardiac catheterization (N/A, 11/30/2015).   Current Medications: Current Meds  Medication Sig  . aspirin EC 81 MG tablet Take 81 mg by mouth daily.  [name] losartan (COZAAR) 25 MG tablet TAKE 1 TABLET BY MOUTH EVERY DAY  . nitroGLYCERIN (NITROSTAT) 0.4 MG SL tablet Place 1 tablet (0.4 mg total) under the tongue every 5 (five) minutes as needed for chest pain.     Allergies:   Atorvastatin; Crestor [rosuvastatin]; Pravastatin sodium; and Ibuprofen   Social History   Tobacco Use  . Smoking status: Never Smoker  . Smokeless tobacco: Never Used  Substance Use Topics  . Alcohol use: No  . Drug use: No     Family Hx: The patient's family history includes Alcohol abuse in her father; Cancer (age of onset: 66) in her father; Diabetes in her sister and sister; Heart attack in her brother; Prostate cancer in her father. There is no history of Stroke.  ROS:   Please see the history of present illness.    ROS All other systems reviewed and are negative.   EKGs/Labs/Other Test Reviewed:    EKG:  EKG is  ordered today.  The ekg ordered today  demonstrates normal sinus rhythm, heart rate 65, normal axis, QTC 420  Recent Labs: 02/08/2017: TSH 0.914 02/09/2017: ALT 31; BUN 9; Creatinine, Ser 0.75; Hemoglobin 12.7; Platelets 411; Potassium 3.9; Sodium 138   Recent Lipid Panel Lab Results  Component Value Date/Time   CHOL 175 02/08/2017 07:17 PM   TRIG 88 02/08/2017 07:17 PM   HDL 51 02/08/2017 07:17 PM   CHOLHDL 3.4 02/08/2017 07:17 PM   LDLCALC 106 (H) 02/08/2017 07:17 PM    Physical Exam:    VS:  BP 130/74   Pulse 65   Ht 4\' 10"  (1.473 m)   Wt 95 lb 6.4 oz (43.3 kg)   SpO2 98%   BMI 19.94 kg/m     Wt Readings from Last 3 Encounters:  12/20/17 95 lb 6.4  oz (43.3 kg)  02/09/17 92 lb 8 oz (42 kg)  11/21/16 95 lb 12.8 oz (43.5 kg)     Physical Exam  Constitutional: She is oriented to person, place, and time. She appears well-developed and well-nourished. No distress.  HENT:  Head: Normocephalic and atraumatic.  Neck: Neck supple. No JVD present.  Cardiovascular: Normal rate, regular rhythm, S1 normal, S2 normal and normal heart sounds.  No murmur heard. Pulmonary/Chest: Effort normal. She has no rales.  Abdominal: Soft. There is no hepatomegaly.  Musculoskeletal: She exhibits no edema.  Neurological: She is alert and oriented to person, place, and time.  Skin: Skin is warm and dry.    ASSESSMENT & PLAN:    Coronary artery disease involving native coronary artery of native heart without angina pectoris  History of non-ST elevation myocardial infarction 2012 treated with drug-eluting stents in the mid LAD and non-ST elevation myocardial infarction in 2017 treated with drug-eluting stent to the LAD within the previous stent and angioplasty to the first diagonal.  She has stopped taking clopidogrel secondary to gum bleeding and epistaxis.  I have recommended that she remain on Plavix long-term to prevent stent thrombosis.  However, she is not willing to do this.  We briefly discussed whether or not to take aspirin 162  mg daily.  However, she does not want to do this.  Therefore, she will remain on aspirin 81 mg daily.  She has been intolerant to statins and did have elevated LFTs in the past.  She has never had this evaluated.  We discussed referral to the lipid clinic but she is not willing to do this.  -Continue aspirin 81 mg  Essential hypertension  The patient's blood pressure is controlled on her current regimen.  Continue current therapy.   - Plan: Basic metabolic panel  Pure hypercholesterolemia  She is intolerant to statins.  She had elevated LFTs in the past in the setting of statin therapy.  She is not willing to try Zetia or be referred to the lipid clinic.  - Plan: Lipid panel, Hepatic function panel  Bilateral carotid artery disease, unspecified type ([place]) Mild bilateral disease by carotid Dopplers August 2018.  Syncope, unspecified syncope type Likely vasovagal episode.  She has not had any recurrent symptoms.  Event monitor was suggested last year in the hospital.  However, since it has been more than 6 months since her last episode, an event monitor at this point would not be helpful.   Dispo:  Return in about 1 year (around 12/21/2018) for Routine Follow Up, w/ Dr. Burt Knack, or Richardson Dopp, PA-C.   Medication Adjustments/Labs and Tests Ordered: Current medicines are reviewed at length with the patient today.  Concerns regarding medicines are outlined above.  Tests Ordered: Orders Placed This Encounter  Procedures  . Lipid panel  . Basic metabolic panel  . Hepatic function panel  . EKG 12-Lead   Medication Changes: No orders of the defined types were placed in this encounter.   Signed, Richardson Dopp, PA-C  12/20/2017 8:38 AM    [facility] [place], [place], [place]  [address] Phone: [phone]; Fax: [phone]

## 2017-12-20 ENCOUNTER — Encounter: Payer: Self-pay | Admitting: Physician Assistant

## 2017-12-20 ENCOUNTER — Ambulatory Visit: Payer: Medicare Other | Admitting: Physician Assistant

## 2017-12-20 ENCOUNTER — Encounter (INDEPENDENT_AMBULATORY_CARE_PROVIDER_SITE_OTHER): Payer: Self-pay

## 2017-12-20 VITALS — BP 130/74 | HR 65 | Ht <= 58 in | Wt 95.4 lb

## 2017-12-20 DIAGNOSIS — I779 Disorder of arteries and arterioles, unspecified: Secondary | ICD-10-CM

## 2017-12-20 DIAGNOSIS — I739 Peripheral vascular disease, unspecified: Secondary | ICD-10-CM

## 2017-12-20 DIAGNOSIS — I10 Essential (primary) hypertension: Secondary | ICD-10-CM | POA: Diagnosis not present

## 2017-12-20 DIAGNOSIS — I2510 Atherosclerotic heart disease of native coronary artery without angina pectoris: Secondary | ICD-10-CM | POA: Diagnosis not present

## 2017-12-20 DIAGNOSIS — E7800 Pure hypercholesterolemia, unspecified: Secondary | ICD-10-CM

## 2017-12-20 DIAGNOSIS — R55 Syncope and collapse: Secondary | ICD-10-CM

## 2017-12-20 LAB — BASIC METABOLIC PANEL
BUN / CREAT RATIO: 9 — AB (ref 12–28)
BUN: 7 mg/dL — AB (ref 8–27)
CO2: 25 mmol/L (ref 20–29)
CREATININE: 0.77 mg/dL (ref 0.57–1.00)
Calcium: 9.5 mg/dL (ref 8.7–10.3)
Chloride: 101 mmol/L (ref 96–106)
GFR, EST AFRICAN AMERICAN: 90 mL/min/{1.73_m2} (ref >59)
GFR, EST NON AFRICAN AMERICAN: 78 mL/min/{1.73_m2} (ref >59)
GLUCOSE: 94 mg/dL (ref 65–99)
Potassium: 4.5 mmol/L (ref 3.5–5.2)
SODIUM: 142 mmol/L (ref 134–144)

## 2017-12-20 LAB — LIPID PANEL
Chol/HDL Ratio: 4.8 ratio — ABNORMAL HIGH (ref 0.0–4.4)
Cholesterol, Total: 188 mg/dL (ref 100–199)
HDL: 39 mg/dL — AB (ref >39)
LDL Calculated: 100 mg/dL — ABNORMAL HIGH (ref 0–99)
TRIGLYCERIDES: 243 mg/dL — AB (ref 0–149)
VLDL CHOLESTEROL CAL: 49 mg/dL — AB (ref 5–40)

## 2017-12-20 LAB — HEPATIC FUNCTION PANEL
ALT: 48 IU/L — ABNORMAL HIGH (ref 0–32)
AST: 55 IU/L — ABNORMAL HIGH (ref 0–40)
Albumin: 4.5 g/dL (ref 3.5–4.8)
Alkaline Phosphatase: 81 IU/L (ref 39–117)
BILIRUBIN, DIRECT: 0.09 mg/dL (ref 0.00–0.40)
Bilirubin Total: 0.3 mg/dL (ref 0.0–1.2)
TOTAL PROTEIN: 7.5 g/dL (ref 6.0–8.5)

## 2017-12-20 NOTE — Patient Instructions (Signed)
Medication Instructions:  1. Your physician recommends that you continue on your current medications as directed. Please refer to the Current Medication list given to you today.   Labwork: TODAY BMET, LIPIDS, LFT  Testing/Procedures: NONE ORDERED TODAY  Follow-Up: 1 YEAR FOLLOW UP WITH DR. Burt Knack   Any Other Special Instructions Will Be Listed Below (If Applicable).     If you need a refill on your cardiac medications before your next appointment, please call your pharmacy.

## 2017-12-21 ENCOUNTER — Telehealth: Payer: Self-pay | Admitting: *Deleted

## 2017-12-21 NOTE — Telephone Encounter (Signed)
Pt has been notified of lab results and findings by phone with verbal understanding. Pt advised of recommendations to GI for elevated LFT's and to Lipid Clinic for management of lipids. Pt states to me she is not going to do either one of these recommendations. I explained to her the importance of referral to [facility] with her having CAD and Hyperlipidemia we need to get the LDL down to <70 which at this time is 100. Advised referral to GI as to elevated AST and ALT. Pt asked for the numbers of her cholesterol and the AST and ALT. I have given the pt these numbers. Pt states she will lessen her carbs to help lower the Trigs, but she is refusing to see Lipid Clinic and GI. I will forward a copy of results to PCP as well.

## 2017-12-21 NOTE — Telephone Encounter (Signed)
-----   Message from Liliane Shi, [place] sent at 12/20/2017  4:58 PM EDT ----- Lipids are too high.  LDL should be less than 70.  Triglycerides should be less than 150.  HDL should be greater than 50.  Renal function, potassium normal.  LFTs remain mildly elevated. PLAN: 1.  Recommend patient follow-up in the lipid clinic for management of lipids (if she is willing, please make referral). 2.  Patient still needs to see gastroenterology to evaluate elevated LFTs.  If she is willing please make referral to GI. 3.  Fax labs to PCP. Richardson Dopp, PA-C    12/20/2017 4:56 PM

## 2018-02-08 ENCOUNTER — Other Ambulatory Visit: Payer: Self-pay

## 2018-02-08 NOTE — Patient Outreach (Signed)
[facility]) Care Management  02/08/2018  [name] December 13, 1945 [id number]   Medication Adherence call to Mrs. [name] left a message for patient to call back patient is due on Losartan 25 mg. Mrs. [name] is showing past due under [place].  [place] Management Direct Dial [phone]  Fax [phone] [email or web address]

## 2018-02-24 ENCOUNTER — Other Ambulatory Visit: Payer: Self-pay

## 2018-02-24 ENCOUNTER — Encounter (HOSPITAL_COMMUNITY): Payer: Self-pay

## 2018-02-24 ENCOUNTER — Emergency Department (HOSPITAL_COMMUNITY)
Admission: EM | Admit: 2018-02-24 | Discharge: 2018-02-24 | Disposition: A | Discharge status: Home / Self Care | Payer: Medicare Other | Attending: Emergency Medicine | Admitting: Emergency Medicine

## 2018-02-24 DIAGNOSIS — R21 Rash and other nonspecific skin eruption: Secondary | ICD-10-CM | POA: Diagnosis not present

## 2018-02-24 DIAGNOSIS — Z79899 Other long term (current) drug therapy: Secondary | ICD-10-CM | POA: Insufficient documentation

## 2018-02-24 DIAGNOSIS — I2510 Atherosclerotic heart disease of native coronary artery without angina pectoris: Secondary | ICD-10-CM | POA: Diagnosis not present

## 2018-02-24 DIAGNOSIS — Z7982 Long term (current) use of aspirin: Secondary | ICD-10-CM | POA: Insufficient documentation

## 2018-02-24 DIAGNOSIS — T7840XA Allergy, unspecified, initial encounter: Secondary | ICD-10-CM

## 2018-02-24 DIAGNOSIS — I10 Essential (primary) hypertension: Secondary | ICD-10-CM | POA: Diagnosis not present

## 2018-02-24 MED ORDER — DEXAMETHASONE SODIUM PHOSPHATE 10 MG/ML IJ SOLN
10.0000 mg | Freq: Once | INTRAMUSCULAR | Status: AC
  Administered 2018-02-24: 10 mg via INTRAMUSCULAR
  Filled 2018-02-24: qty 1

## 2018-02-24 MED ORDER — DIPHENHYDRAMINE HCL 25 MG PO CAPS
25.0000 mg | ORAL_CAPSULE | Freq: Once | ORAL | Status: AC
  Administered 2018-02-24: 25 mg via ORAL
  Filled 2018-02-24: qty 1

## 2018-02-24 MED ORDER — PREDNISONE 20 MG PO TABS: 40.0000 mg | ORAL_TABLET | Freq: Every day | ORAL | 0 refills | Status: AC

## 2018-02-24 NOTE — ED Provider Notes (Signed)
[facility] EMERGENCY DEPARTMENT Provider Note   CSN: [id number] Arrival date & time: 02/24/18  1010  History   Chief Complaint Chief Complaint  Patient presents with  . Rash    HPI [name] is a 72 y.o. female past medical history significant for hypertension, arthritis, CAD, hepatitis B who presents for evaluation of allergic reaction.  Rash began approximately 24 hours ago.  Patient states that she noted a rash to her right lower extremity and one spot on her right breast.  She states she has had this happen before in which she was treated with a shot of steroids and Benadryl and it resolved.  Patient states she tried a new brand of honey which she thinks caused her symptoms. Denies fever, nausea, vomiting, chills, headache, chest pain, shortness of breath, lip swelling, airway tightening, tongue swelling, facial swelling, abdominal pain, diarrhea.  HPI  Past Medical History:  Diagnosis Date  . Arthritis   . CAD (coronary artery disease)    a. s/p NSTEMI 10/12: LHC 04/02/11: mLAD 95%, EF 60%; PCI: Promus DES to mLAD. b. LHC 04/29/11:  LAD stent patent, proximal RCA 25%, EF 65%  c. 11/2015 - NSTEMI: cath w/ 99% stenosis prox LAD (DES placed) and 90% stenosis D1 (jailed by old stent, POBA alone performed), pRCA 40, EF > 65%  . Carotid stenosis 2015   a. Carotid US 8/15: 40-59% bilateral ICA stenosis  //   b. Carotid US 7/17: Stable 40-59% bilateral ICA stenosis. FU 1 year  . GERD (gastroesophageal reflux disease)   . HBV (hepatitis B virus) infection   . Hiatal hernia   . History of echocardiogram    a. Echo 6/17:  EF 60-65%, mild anterior HK, grade 1 diastolic dysfunction, mild TR, PASP 23 mmHg  . History of noncompliance with medical treatment   . HLD (hyperlipidemia)   . HTN (hypertension)    a. RA duplex 11/15: norma caliber aorta, norma RA bilaterally   . Lung nodule    a. Pt initially refused further workup. b. Per pulm note, resolved 02/19/14 and  never smoker so no further f/u needed.  . Multinodular thyroid   . PVC's (premature ventricular contractions)     Patient Active Problem List   Diagnosis Date Noted  . Head injury   . Syncope   . Status post coronary artery stent placement   . History of non-ST elevation myocardial infarction (NSTEMI) 11/28/2015  . PVC's (premature ventricular contractions)   . Bilateral carotid artery disease ([place])   . Upper airway cough syndrome 02/19/2014  . Lung nodule, solitary 06/01/2013  . Elevated LFTs 09/11/2011  . CAD (coronary artery disease)   . Chest pain 09/15/2010  . Hyperlipidemia 09/15/2010  . Hypertension 09/15/2010    Past Surgical History:  Procedure Laterality Date  . BIOPSY THYROID     "negative"  . CARDIAC CATHETERIZATION N/A 11/30/2015   Procedure: Left Heart Cath and Coronary Angiography;  Surgeon: Burnell Blanks, MD;  Location: [facility];  Service: Cardiovascular;  Laterality: N/A;  . CESAREAN SECTION  1985  . CORONARY ANGIOPLASTY WITH STENT PLACEMENT  2012; 11/30/2015  . EXCISIONAL HEMORRHOIDECTOMY  1999  . LIPOMA EXCISION Right ~ 2015   "popliteal"  . TONSILLECTOMY       OB History   None      Home Medications    Prior to Admission medications   Medication Sig Start Date End Date Taking? Authorizing Provider  aspirin EC 81 MG  tablet Take 81 mg by mouth daily.    [provider]  losartan (COZAAR) 25 MG tablet TAKE 1 TABLET BY MOUTH EVERY DAY 12/18/17   Richardson Dopp T, PA-C  nitroGLYCERIN (NITROSTAT) 0.4 MG SL tablet Place 1 tablet (0.4 mg total) under the tongue every 5 (five) minutes as needed for chest pain. 06/26/17   Sherren Mocha, MD  predniSONE (DELTASONE) 20 MG tablet Take 2 tablets (40 mg total) by mouth daily with breakfast for 3 days. 02/24/18 02/27/18  Lochlin Eppinger A, PA-C    Family History Family History  Problem Relation Age of Onset  . Prostate cancer Father   . Cancer Father 24       bladder cancer  .  Alcohol abuse Father   . Diabetes Sister   . Diabetes Sister   . Heart attack Brother   . Stroke Neg Hx     Social History Social History   Tobacco Use  . Smoking status: Never Smoker  . Smokeless tobacco: Never Used  Substance Use Topics  . Alcohol use: No  . Drug use: No     Allergies   Atorvastatin; Crestor [rosuvastatin]; Pravastatin sodium; and Ibuprofen   Review of Systems Review of Systems  Constitutional: Negative for activity change, chills, diaphoresis, fatigue and fever.  HENT: Negative for congestion, drooling, ear discharge, ear pain, facial swelling, hearing loss, mouth sores, rhinorrhea, sinus pressure, sinus pain, sneezing, sore throat, tinnitus, trouble swallowing and voice change.   Eyes: Negative for pain, discharge and itching.  Respiratory: Negative for cough, choking, chest tightness, shortness of breath, wheezing and stridor.   Cardiovascular: Negative for chest pain, palpitations and leg swelling.  Gastrointestinal: Negative for abdominal distention, abdominal pain, blood in stool, diarrhea, nausea and vomiting.  Genitourinary: Negative.   Musculoskeletal: Negative.   Skin: Positive for rash.  Neurological: Negative.   All other systems reviewed and are negative.  Review of systems negative unless otherwise stated in the HPI  Physical Exam Updated Vital Signs BP (!) 173/59 (BP Location: Right Arm)   Pulse 91   Temp 98.1 F (36.7 C) (Oral)   Resp 20   Ht 4\' 10"  (1.473 m)   Wt 43.1 kg   SpO2 99%   BMI 19.86 kg/m   Physical Exam  Constitutional: She appears well-developed and well-nourished. No distress.  HENT:  Head: Normocephalic and atraumatic.  Right Ear: External ear normal.  Left Ear: External ear normal.  Nose: Nose normal.  Mouth/Throat: Oropharynx is clear and moist.  No elevation of palate.  Able to tolerate oral secretions.  No evidence of facial or oral swelling.  Eyes: Pupils are equal, round, and reactive to light.    Neck: Normal range of motion. Neck supple.  Cardiovascular: Normal rate, regular rhythm, normal heart sounds, intact distal pulses and normal pulses. Exam reveals no gallop and no friction rub.  No murmur heard. Pulmonary/Chest: Effort normal and breath sounds normal. No stridor. No respiratory distress. She has no wheezes. She has no rales. She exhibits no tenderness.  Unlabored respirations, and is able to speak in full sentences without difficulty.  No signs of respiratory distress.  Abdominal: Soft. Bowel sounds are normal. She exhibits no distension. There is no tenderness. There is no guarding.  Musculoskeletal: Normal range of motion.  Neurological: She is alert. She has normal strength. No sensory deficit.  Neurovascularly intact to bilateral lower extremities  Skin: Skin is warm and dry.  1 x 1 cm wheal to the right breast,  multiple scattered erythematous wheals to the right shin.  No edema, warmth to bilateral lower extremities.  Psychiatric: She has a normal mood and affect.  Nursing note and vitals reviewed.    ED Treatments / Results  Labs (all labs ordered are listed, but only abnormal results are displayed) Labs Reviewed - No data to display  EKG None  Radiology No results found.  Procedures Procedures (including critical care time)  Medications Ordered in ED Medications  dexamethasone (DECADRON) injection 10 mg (10 mg Intramuscular Given 02/24/18 1051)  diphenhydrAMINE (BENADRYL) capsule 25 mg (25 mg Oral Given 02/24/18 1052)     Initial Impression / Assessment and Plan / ED Course  I have reviewed the triage vital signs and the nursing notes as well as past medical history.  Pertinent labs & imaging results that were available during my care of the patient were reviewed by me and considered in my medical decision making (see chart for details).  Presents for evaluation of allergic reaction x24 hours ago.  Patient states she has had this before treated with a  steroid injection and Benadryl which resolved her symptoms.  She requests same treatment she has had previously. Small dime sized well to the right breast with multiple scattered errythematous pruritic wheals the right anterior shin.  No signs of respiratory distress, speaking full sentences, handling secretions well., low suspicion for anaphylaxis symptoms.  Will treat with steroids and p.o. Benadryl and reassess.  Rash is improvement after Benadryl and steroids.  Patient states she wants to go home at this time.  Discussed with patient  observation for a period of time for allergic reactions however she states she will return if she has any further issues.  Will DC with prednisone and Benadryl.  Discussed strict return precautions with patient.  VSS, for discharge home.  Voiced understanding    Final Clinical Impressions(s) / ED Diagnoses   Final diagnoses:  Allergic reaction, initial encounter    ED Discharge Orders         Ordered    predniSONE (DELTASONE) 20 MG tablet  Daily with breakfast     02/24/18 1248           Onesti Bonfiglio A, PA-C 02/24/18 1252    Tegeler, Gwenyth Allegra, MD 02/24/18 [id number]

## 2018-02-24 NOTE — ED Notes (Signed)
Patient verbalized understanding of discharge instructions and denies any further needs or questions at this time. VS stable. Patient ambulatory with steady gait.  

## 2018-02-24 NOTE — ED Triage Notes (Signed)
Pt endorses rash to right leg, small area on right chest and both ears that began yesterday. Pt thinks she is allergic to honey. VSS. Airway intact.

## 2018-02-24 NOTE — Discharge Instructions (Addendum)
You were evaluated today for an allergic reaction.  We will send you home with a steroid, Prednisone.  Please take 40 mg total by mouth once daily for 3 days.  Take Benadryl 25 mg every 8 hours for 3 days. Benadryl may cause sedation.    Return to the ED with any of the following symptoms:  Contact a doctor if: Your symptoms are not better with medicine. Your joints are painful or swollen. Get help right away if: You have a fever. You have belly pain. Your tongue or lips are swollen. Your eyelids are swollen. Your chest or throat feels tight. You have trouble breathing or swallowing.

## 2018-02-26 ENCOUNTER — Other Ambulatory Visit: Payer: Self-pay

## 2019-07-03 ENCOUNTER — Telehealth: Payer: Self-pay | Admitting: Physician Assistant

## 2019-07-03 NOTE — Telephone Encounter (Signed)
I called and spoke with patient, she states that the last time she took her Nitroglycerin it made her tongue burn. This was about 9 months ago. She states that she is still having some burning in her tongue, it is not everyday, the burning comes and goes. She would like to know if this is a side effect? She does not want to try anything else and she does not want to come in for an appointment, she is scared to go out due to Covid.

## 2019-07-03 NOTE — Telephone Encounter (Signed)
New Message     Pt c/o medication issue:  1. Name of Medication: nitroGLYCERIN (NITROSTAT) 0.4 MG SL tablet  2. How are you currently taking this medication (dosage and times per day)? Once in a while she takes it, as needed   3. Are you having a reaction (difficulty breathing--STAT)? No   4. What is your medication issue? Pt says she is having burning of the tongue when she uses this medication.  She states the burning will last about an hour   Please advise

## 2019-07-03 NOTE — Telephone Encounter (Signed)
It is normal to have some tingling when the NTG dissolves. This is usually a sign that the NTG is active.  She should not continue to have any sensation long term.  This is a short term side effect.  So, if she is still having burning, it is not related to the NTG and she should d/w her PCP. Richardson Dopp, PA-C    07/03/2019 2:02 PM

## 2019-07-04 NOTE — Telephone Encounter (Signed)
I called and spoke with patient, she is aware that per Nicki Reaper it is normal to have some tingling, but it should not still be burning 9 months later, patient will contact primary care doctor.

## 2019-07-29 ENCOUNTER — Ambulatory Visit (INDEPENDENT_AMBULATORY_CARE_PROVIDER_SITE_OTHER): Payer: Medicare HMO | Admitting: Otolaryngology

## 2019-07-29 ENCOUNTER — Other Ambulatory Visit: Payer: Self-pay

## 2019-07-29 ENCOUNTER — Encounter (INDEPENDENT_AMBULATORY_CARE_PROVIDER_SITE_OTHER): Payer: Self-pay | Admitting: Otolaryngology

## 2019-07-29 VITALS — Temp 97.3°F

## 2019-07-29 DIAGNOSIS — K140 Glossitis: Secondary | ICD-10-CM | POA: Diagnosis not present

## 2019-07-29 NOTE — Progress Notes (Signed)
HPI: [name] is a 74 y.o. female who presents is referred by her cardiologist for evaluation of burning sensation on her tongue after taking nitroglycerin.  She is also concerned about 2 purple-colored lumps on either side of her tongue.  She is concerned about possible neoplasm.  She describes a burning sensation involves the entire tongue.  This apparently is secondary to nitroglycerin use..  She does have history of GE reflux disease.[name]  Past Medical History:  Diagnosis Date  . Arthritis   . CAD (coronary artery disease)    a. s/p NSTEMI 10/12: LHC 04/02/11: mLAD 95%, EF 60%; PCI: Promus DES to mLAD. b. LHC 04/29/11:  LAD stent patent, proximal RCA 25%, EF 65%  c. 11/2015 - NSTEMI: cath w/ 99% stenosis prox LAD (DES placed) and 90% stenosis D1 (jailed by old stent, POBA alone performed), pRCA 40, EF > 65%  . Carotid stenosis 2015   a. Carotid US 8/15: 40-59% bilateral ICA stenosis  //   b. Carotid US 7/17: Stable 40-59% bilateral ICA stenosis. FU 1 year  . GERD (gastroesophageal reflux disease)   . HBV (hepatitis B virus) infection   . Hiatal hernia   . History of echocardiogram    a. Echo 6/17:  EF 60-65%, mild anterior HK, grade 1 diastolic dysfunction, mild TR, PASP 23 mmHg  . History of noncompliance with medical treatment   . HLD (hyperlipidemia)   . HTN (hypertension)    a. RA duplex 11/15: norma caliber aorta, norma RA bilaterally   . Lung nodule    a. Pt initially refused further workup. b. Per pulm note, resolved 02/19/14 and never smoker so no further f/u needed.  . Multinodular thyroid   . PVC's (premature ventricular contractions)    Past Surgical History:  Procedure Laterality Date  . BIOPSY THYROID     "negative"  . CARDIAC CATHETERIZATION N/A 11/30/2015   Procedure: Left Heart Cath and Coronary Angiography;  Surgeon: Burnell Blanks, MD;  Location: [facility];  Service: Cardiovascular;  Laterality: N/A;  . CESAREAN SECTION  1985  . CORONARY  ANGIOPLASTY WITH STENT PLACEMENT  2012; 11/30/2015  . EXCISIONAL HEMORRHOIDECTOMY  1999  . LIPOMA EXCISION Right ~ 2015   "popliteal"  . TONSILLECTOMY     Social History   Socioeconomic History  . Marital status: Married    Spouse name: Not on file  . Number of children: 1  . Years of education: Not on file  . Highest education level: Not on file  Occupational History  . 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  Tobacco Use  . Smoking status: Never Smoker  . Smokeless tobacco: Never Used  Substance and Sexual Activity  . Alcohol use: No  . Drug use: No  . Sexual activity: Never    Birth control/protection: Post-menopausal  Other Topics Concern  . Not on file  Social History Narrative   Marital status: married x 31 years.  From [place]; to [place] 1984.      Children:  1 child (30); no grandchildren.      Lives: with husband.      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      Tobacco: never      Alcohol: none       Exercise: works in the garden.    Social Determinants of Health   Financial Resource Strain:   . Difficulty of Paying Living Expenses: Not on file  Food Insecurity:   . Worried About Charity fundraiser in the Last Year: Not on file  . Ran Out of Food in the Last Year: Not on file  Transportation Needs:   . Lack of Transportation (Medical): Not on file  . Lack of Transportation (Non-Medical): Not on file  Physical Activity:   . Days of Exercise per Week: Not on file  . Minutes of Exercise per Session: Not on file  Stress:   . Feeling of Stress : Not on file  Social Connections:   . Frequency of Communication with Friends and Family: Not on file  . Frequency of Social Gatherings with Friends and Family: Not on file  . Attends Religious Services: Not on file  . Active Member of Clubs or Organizations: Not on file  . Attends Archivist Meetings: Not on file  . Marital Status: Not on file    Family History  Problem Relation Age of Onset  . Prostate cancer Father   . Cancer Father 28       bladder cancer  . Alcohol abuse Father   . Diabetes Sister   . Diabetes Sister   . Heart attack Brother   . Stroke Neg Hx    Allergies  Allergen Reactions  . Atorvastatin Other (See Comments)    Muscle pain  . Crestor [Rosuvastatin] Other (See Comments)    Muscle pain  . Pravastatin Sodium Other (See Comments)    Muscle cramps/ confusion  . Ibuprofen Palpitations    REACTION: rapid heart rate   Prior to Admission medications   Medication Sig Start Date End Date Taking? Authorizing Provider  aspirin EC 81 MG tablet Take 81 mg by mouth daily.   Yes [provider]  losartan (COZAAR) 25 MG tablet TAKE 1 TABLET BY MOUTH EVERY DAY 12/18/17  Yes Weaver, Scott T, PA-C  nitroGLYCERIN (NITROSTAT) 0.4 MG SL tablet Place 1 tablet (0.4 mg total) under the tongue every 5 (five) minutes as needed for chest pain. 06/26/17  Yes Sherren Mocha, MD     Positive ROS: Otherwise negative  All other systems have been reviewed and were otherwise negative with the exception of those mentioned in the HPI and as above.  Physical Exam: Constitutional: Alert, well-appearing, no acute distress Ears: External ears without lesions or tenderness. Ear canals are clear bilaterally with intact, clear TMs.  Nasal: External nose without lesions. Septum relatively midline. Clear nasal passages Oral: Lips and gums without lesions. Tongue and palate mucosa without lesions. Posterior oropharynx clear.  On examination of the tongue the purple lesions that she is concerned about is just submucosal venous structures.  This is soft to palpation and not tender.  The entire tongue is normal to appearance with no ulcerations.  No nodules to palpation.  No evidence of any infection or neoplasm. Neck: No palpable adenopathy or masses Respiratory: Breathing comfortably  Skin: No facial/neck lesions or rash  noted.  Procedures  Assessment: Mild glossitis secondary to history of nitroglycerin use versus reflux symptoms.  Everything appears benign clinically and no signs of infection.  Plan: Reassured patient of benign tongue lesions that she is concerned about. Did not recommend any biopsy that she inquired about. She will follow-up if symptoms worsen. Did not recommend any specific therapy outside of analgesic throat lozenges as needed.Radene Journey, MD   CC:

## 2019-09-20 ENCOUNTER — Ambulatory Visit (INDEPENDENT_AMBULATORY_CARE_PROVIDER_SITE_OTHER): Payer: Medicare HMO | Admitting: Otolaryngology

## 2019-09-20 ENCOUNTER — Other Ambulatory Visit: Payer: Self-pay

## 2019-09-20 DIAGNOSIS — H9313 Tinnitus, bilateral: Secondary | ICD-10-CM

## 2019-09-20 NOTE — Progress Notes (Signed)
HPI: [name] is a 74 y.o. female who returns today for evaluation of tinnitus.  Patient describes ringing in her ears over the past few weeks.  She denies any loud noise exposure.  She does not complain of any difficulty hearing.  She has had no ear trauma and no earache..  Past Medical History:  Diagnosis Date  . Arthritis   . CAD (coronary artery disease)    a. s/p NSTEMI 10/12: LHC 04/02/11: mLAD 95%, EF 60%; PCI: Promus DES to mLAD. b. LHC 04/29/11:  LAD stent patent, proximal RCA 25%, EF 65%  c. 11/2015 - NSTEMI: cath w/ 99% stenosis prox LAD (DES placed) and 90% stenosis D1 (jailed by old stent, POBA alone performed), pRCA 40, EF > 65%  . Carotid stenosis 2015   a. Carotid US 8/15: 40-59% bilateral ICA stenosis  //   b. Carotid US 7/17: Stable 40-59% bilateral ICA stenosis. FU 1 year  . GERD (gastroesophageal reflux disease)   . HBV (hepatitis B virus) infection   . Hiatal hernia   . History of echocardiogram    a. Echo 6/17:  EF 60-65%, mild anterior HK, grade 1 diastolic dysfunction, mild TR, PASP 23 mmHg  . History of noncompliance with medical treatment   . HLD (hyperlipidemia)   . HTN (hypertension)    a. RA duplex 11/15: norma caliber aorta, norma RA bilaterally   . Lung nodule    a. Pt initially refused further workup. b. Per pulm note, resolved 02/19/14 and never smoker so no further f/u needed.  . Multinodular thyroid   . PVC's (premature ventricular contractions)    Past Surgical History:  Procedure Laterality Date  . BIOPSY THYROID     "negative"  . CARDIAC CATHETERIZATION N/A 11/30/2015   Procedure: Left Heart Cath and Coronary Angiography;  Surgeon: Burnell Blanks, MD;  Location: [facility];  Service: Cardiovascular;  Laterality: N/A;  . CESAREAN SECTION  1985  . CORONARY ANGIOPLASTY WITH STENT PLACEMENT  2012; 11/30/2015  . EXCISIONAL HEMORRHOIDECTOMY  1999  . LIPOMA EXCISION Right ~ 2015   "popliteal"  . TONSILLECTOMY     Social  History   Socioeconomic History  . Marital status: Married    Spouse name: Not on file  . Number of children: 1  . Years of education: Not on file  . Highest education level: Not on file  Occupational History  . 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  Tobacco Use  . Smoking status: Never Smoker  . Smokeless tobacco: Never Used  Substance and Sexual Activity  . Alcohol use: No  . Drug use: No  . Sexual activity: Never    Birth control/protection: Post-menopausal  Other Topics Concern  . Not on file  Social History Narrative   Marital status: married x 31 years.  From [place]; to [place] 1984.      Children:  1 child (30); no grandchildren.      Lives: with husband.      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      Tobacco: never      Alcohol: none       Exercise: works in the garden.    Social Determinants of Health   Financial Resource Strain:   . Difficulty of Paying Living Expenses:   Food Insecurity:   . Worried About Charity fundraiser in the Last Year:   . [place] in the  Last Year:   Transportation Needs:   . Film/video editor (Medical):   [name] Lack of Transportation (Non-Medical):   Physical Activity:   . Days of Exercise per Week:   . Minutes of Exercise per Session:   Stress:   . Feeling of Stress :   Social Connections:   . Frequency of Communication with Friends and Family:   . Frequency of Social Gatherings with Friends and Family:   . Attends Religious Services:   . Active Member of Clubs or Organizations:   . Attends Archivist Meetings:   [name] Marital Status:    Family History  Problem Relation Age of Onset  . Prostate cancer Father   . Cancer Father 87       bladder cancer  . Alcohol abuse Father   . Diabetes Sister   . Diabetes Sister   . Heart attack Brother   . Stroke Neg Hx    Allergies  Allergen Reactions  . Atorvastatin Other (See Comments)    Muscle pain  .  Crestor [Rosuvastatin] Other (See Comments)    Muscle pain  . Pravastatin Sodium Other (See Comments)    Muscle cramps/ confusion  . Ibuprofen Palpitations    REACTION: rapid heart rate   Prior to Admission medications   Medication Sig Start Date End Date Taking? Authorizing Provider  aspirin EC 81 MG tablet Take 81 mg by mouth daily.    [provider]  losartan (COZAAR) 25 MG tablet TAKE 1 TABLET BY MOUTH EVERY DAY 12/18/17   Richardson Dopp T, PA-C  nitroGLYCERIN (NITROSTAT) 0.4 MG SL tablet Place 1 tablet (0.4 mg total) under the tongue every 5 (five) minutes as needed for chest pain. 06/26/17   Sherren Mocha, MD     Positive ROS: Otherwise negative  All other systems have been reviewed and were otherwise negative with the exception of those mentioned in the HPI and as above.  Physical Exam: Constitutional: Alert, well-appearing, no acute distress Ears: External ears without lesions or tenderness. Ear canals are clear bilaterally with intact, clear TMs.  TMs with good mobility on pneumatic otoscopy.  On hearing screening with a 512 1024 tuning fork her hearing was symmetric although she had mild to moderate hearing loss in both ears with the 1024 tuning fork.  AC > BC bilaterally. Nasal: External nose without lesions. Septum midline. Clear nasal passages Oral: Lips and gums without lesions. Tongue and palate mucosa without lesions. Posterior oropharynx clear. Neck: No palpable adenopathy or masses Respiratory: Breathing comfortably  Skin: No facial/neck lesions or rash noted.  Procedures  Assessment: Tinnitus secondary to presbycusis.  Plan: Reviewed with patient concerning tinnitus and limited treatment options.  I discussed with her concerning avoiding loud noise exposure and using ear protection when around loud noise. Also discussed with her concerning using masking noise to help with the tinnitus and gave her samples of Lipo flavonoid to try which she really does not  want to take. Briefly discussed option of scheduling audiogram to evaluate her hearing although she does not complain of any hearing problems.   Radene Journey, MD

## 2020-11-25 ENCOUNTER — Telehealth: Payer: Self-pay | Admitting: Physician Assistant

## 2020-11-25 NOTE — Telephone Encounter (Signed)
Patient wanted to get an early appt to see Dr. Burt Knack or Richardson Dopp. Informed patient that Dr. Burt Knack has no availability and Trinidad Curet next available appt would be in September. Patient says that she doesn't want to wait that long and wants to talk with Arbie Cookey. Patient refused to see other APP's that may be available.

## 2020-11-25 NOTE — Telephone Encounter (Signed)
S/w pt is to see Richardson Dopp, PA on June 22 @ 7:45 am.

## 2020-11-26 ENCOUNTER — Telehealth: Payer: Self-pay | Admitting: Physician Assistant

## 2020-11-26 NOTE — Telephone Encounter (Signed)
PT is calling in to speak with the nurse on a personal matter.She states her appt she has on 6/22 she cannot wait that long.

## 2020-11-26 NOTE — Telephone Encounter (Signed)
The patient requests an earlier appointment with Scott.  Reiterated to her that [facility] added her on to his schedule and that that are no earlier availabilities.  Confirmed she is not experiencing CP or SOB. She states her HR is "jumping around." ER precautions reviewed. She was grateful for call and agrees with plan.

## 2020-12-01 NOTE — Progress Notes (Addendum)
Cardiology Office Note:    Date:  12/02/2020   ID:  Shavonte, Zhao Oct 26, 1945, MRN [id number]  PCP:  Leandrew Koyanagi, MD   [facility] Providers Cardiologist:  Sherren Mocha, MD Cardiology APP:  Sharmon Revere      Referring MD: Leandrew Koyanagi, MD   Chief Complaint:  Follow-up (CAD)    Patient Profile:    FAHIMA CIFELLI is a 75 y.o. female with:  Coronary artery disease  NSTEMI in 04/2011 s/p DES to mLAD NSTEMI in 6/17 s/p DES to LAD 2/2 ISR and POBA to Dx Pt stopped Plavix on her own in 2019>>did not want to take again  Carotid artery disease  Hypertension  Hyperlipidemia  GERD PACs/PVCs Non-adherent to med Rx (pt refused multiple meds in the past due to SEs/intolerances) Pt has declined to take beta-blocker, statin, ASA, CCB, ARB) Elevated LFTs Abd [place] w dilated CBD; +HBs Ag; statin DCd in past + referred to GI >> pt never went to see GI Admx with syncope in 8/18 Echocardiogram: normal EF; event monitor recommended>>Pt never had this done  Prior CV studies: Carotid US 02/09/17 bilat ICA 1-39   Echo 02/09/17 EF 60-65, no RWMA, normal diastolic function, mild AI, mild MR   Carotid US 7/17: Stable 40-59% bilateral ICA stenosis. FU 1 year   Echo 11/30/15 EF 60-65%, mild anterior HK, grade 1 diastolic dysfunction, PASP 23 mmHg   LHC 11/30/15 LAD proximal 99% involving the proximal to mid stent, D1 ostial 90% (jailed by stent) LCx okay RCA proximal 40% EF >65% PCI: 3 x 24 mm Synergy DES to the proximal LAD and POBA to the diagonal branch 1. Severe restenosis proximal LAD stent with involvement of the moderate caliber Diagonal branch which is jailed by the old stent.   2. Successful PTCA/DES x 1 proximal LAD 3. Successful PTCA/angioplasty only Diagonal branch (jailed by old stent) 4. NSTEMI 5. Normal LV systolic function Recommendations: Will continue ASA and Plavix for one year if she tolerates. She is intolerant of statins.    Cardiac Cath 04/2011 LAD: Stent in the proximal vessel is widely patent. RCA: prox 20% EF 65%   Carotid US 01/2014 Bilateral ICA 40-59% Normal subclavian arteries, bilaterally. FU 1 year   Renal Artery Duplex 11/15 Normal caliber abdominal aorta. Normal and symmetrical kidney size. Normal renal arteries, bilaterally. The IVC and renal veins are patent.   History of Present Illness: Ms. Purington was last seen in 2019.  She returns for follow-up.  She has stopped all her medications.  She is not even taking aspirin.  As noted previously I have tried to get her into gastroenterology for elevated LFTs and hepatitis B.  However, she refuses to go.  She went to the emergency room 2 years ago with an allergic reaction and attributes it to the losartan.  She does note her heart increases when she does exertional activities.  She denies chest pain, shortness of breath, syncope, orthopnea, leg edema.        Past Medical History:  Diagnosis Date   Arthritis    CAD (coronary artery disease)    a. s/p NSTEMI 10/12: LHC 04/02/11: mLAD 95%, EF 60%; PCI: Promus DES to mLAD. b. LHC 04/29/11:  LAD stent patent, proximal RCA 25%, EF 65%  c. 11/2015 - NSTEMI: cath w/ 99% stenosis prox LAD (DES placed) and 90% stenosis D1 (jailed by old stent, POBA alone performed), pRCA 40, EF > 65%   Carotid stenosis 2015  a. Carotid US 8/15: 40-59% bilateral ICA stenosis  //   b. Carotid US 7/17: Stable 40-59% bilateral ICA stenosis. FU 1 year   GERD (gastroesophageal reflux disease)    HBV (hepatitis B virus) infection    Hiatal hernia    History of echocardiogram    a. Echo 6/17:  EF 60-65%, mild anterior HK, grade 1 diastolic dysfunction, mild TR, PASP 23 mmHg   History of noncompliance with medical treatment    HLD (hyperlipidemia)    HTN (hypertension)    a. RA duplex 11/15: norma caliber aorta, norma RA bilaterally    Lung nodule    a. Pt initially refused further workup. b. Per pulm note, resolved 02/19/14  and never smoker so no further f/u needed.   Multinodular thyroid    PVC's (premature ventricular contractions)     Current Medications: Current Meds  Medication Sig   aspirin EC 81 MG tablet Take 1 tablet (81 mg total) by mouth daily. Swallow whole.   losartan (COZAAR) 50 MG tablet Take 1 tablet (50 mg total) by mouth daily.     Allergies:   Atorvastatin, Crestor [rosuvastatin], Pravastatin sodium, and Ibuprofen   Social History   Tobacco Use   Smoking status: Never   Smokeless tobacco: Never  Vaping Use   Vaping Use: Never used  Substance Use Topics   Alcohol use: No   Drug use: No     Family Hx: The patient's family history includes Alcohol abuse in her father; Cancer (age of onset: 56) in her father; Diabetes in her sister and sister; Heart attack in her brother; Prostate cancer in her father. There is no history of Stroke.  ROS   EKGs/Labs/Other Test Reviewed:    EKG:  EKG is   ordered today.  The ekg ordered today demonstrates normal sinus rhythm, HR 93, normal axis, no ST-T wave changes, QTC 400, no change from prior tracing, increased artifact  Recent Labs: 12/02/2020: ALT 13; BUN 8; Creatinine, Ser 0.81; Hemoglobin WILL FOLLOW; Platelets WILL FOLLOW; Potassium 4.4; Sodium 138; TSH 1.640   Recent Lipid Panel Lab Results  Component Value Date/Time   CHOL 174 12/02/2020 08:37 AM   TRIG 124 12/02/2020 08:37 AM   HDL 41 12/02/2020 08:37 AM   LDLCALC 111 (H) 12/02/2020 08:37 AM      Risk Assessment/Calculations:      Physical Exam:    VS:  BP (!) 160/80   Pulse 93   Ht '4\' 9"'  (1.448 m)   Wt 92 lb (41.7 kg)   BMI 19.91 kg/m     Wt Readings from Last 3 Encounters:  12/02/20 92 lb (41.7 kg)  02/24/18 95 lb (43.1 kg)  12/20/17 95 lb 6.4 oz (43.3 kg)     Constitutional:      Appearance: Healthy appearance. Not in distress.  Neck:     Vascular: JVD normal.  Pulmonary:     Effort: Pulmonary effort is normal.     Breath sounds: No wheezing. No rales.   Cardiovascular:     Normal rate. Regular rhythm. Normal S1. Normal S2.      Murmurs: There is a grade 1/6 systolic murmur at the URSB.  Edema:    Peripheral edema absent.  Abdominal:     Palpations: Abdomen is soft. There is no hepatomegaly.  Skin:    General: Skin is warm and dry.  Neurological:     Mental Status: Alert and oriented to person, place and time.     Cranial  Nerves: Cranial nerves are intact.         ASSESSMENT & PLAN:    1. Coronary artery disease involving native coronary artery of native heart without angina pectoris History of non-ST elevation myocardial infarction 2012 treated with drug-eluting stents in the mid LAD and non-ST elevation myocardial infarction in 2017 treated with drug-eluting stent to the LAD within the previous stent and angioplasty to the first diagonal.  She is currently not having symptoms consistent with angina.  Her electrocardiogram demonstrates no acute changes.  Unfortunately, she has stopped taking all medications, even aspirin.  She has a hx of not taking or stop essential medications to manage coronary artery disease. I had a long discussion with her regarding the risks of not treating blood pressure, cholesterol, etc.  I have explained to her in blunt terms that she is at high risk for heart attack, heart failure, death, arrhythmias, kidney failure.  I have also explained to her that it will be difficult to manage a recurrent myocardial infarction for her if we cannot use medications such as clopidogrel, prasugrel or ticagrelor or even ASA.  After much discussion, she does agree to take aspirin again.  She does have a early systolic murmur on exam.  She probably has aortic sclerosis.  As her blood pressure is markedly uncontrolled and she does feel her heart rate increase when she exerts herself, I will obtain an echocardiogram to rule out structural heart disease.  I will obtain follow-up labs today to include c-Met, CBC, lipids, TSH.  Follow-up in  6 months.  2. Essential hypertension Her blood pressure is markedly uncontrolled.  Repeat blood pressure by me was 180/80.  I have recommended starting metoprolol succinate.  However, she refuses.  I have suggested that we refer her to an allergist given her multiple drug intolerances and allergic reactions.  However, she refuses.  She is willing to resume losartan.  Start losartan 50 mg daily.  Obtain labs today as noted.  Repeat BMET in 2 weeks.  Arrange follow-up with Pharm.D. hypertension clinic in 3 to 4 weeks.  Of note, she refused to schedule this.  She also refused to take Losartan 50 mg after leaving the room.  She agrees to take Losartan 25 mg once daily.  I am concerned that we will not be able to control her blood pressure as she will not likely take any medications that are recommended.  Again, I have explained to her that she is at high risk for recurrent myocardial infarction as well as stroke, kidney failure and dialysis, heart failure and death if her BP is not adequately controlled.   3. Mixed hyperlipidemia She has been intolerant of statins in the past.  She also had elevated LFTs in the past but refused to have further work-up with gastroenterology.  Her hepatitis B surface antigen was positive in the past.  I have explained to her again in blunt terms that she is at high risk for liver failure, liver cancer, etc. without further evaluation and management.  4. Palpitations She does feel her heart rate increased when she exerts herself.  Given her uncontrolled blood pressure, I am concerned that she has developed LV dysfunction.  Obtain echocardiogram as noted.  She also has a soft systolic murmur which will be evaluated with echocardiogram.   5. Murmur Obtain echocardiogram as noted.    Dispo:  Return in about 6 months (around 06/03/2021) for Routine Follow Up, w/ Richardson Dopp, PA-C.   Medication Adjustments/Labs and  Tests Ordered: Current medicines are reviewed at length with  the patient today.  Concerns regarding medicines are outlined above.  Tests Ordered: Orders Placed This Encounter  Procedures   Comp Met (CMET)   Lipid Profile   CBC   TSH   Basic Metabolic Panel (BMET)   AMB Referral to Heartcare Pharm-D   EKG 12-Lead   ECHOCARDIOGRAM COMPLETE    Medication Changes: Meds ordered this encounter  Medications   aspirin EC 81 MG tablet    Sig: Take 1 tablet (81 mg total) by mouth daily. Swallow whole.    Dispense:  90 tablet    Refill:  3   losartan (COZAAR) 50 MG tablet    Sig: Take 1 tablet (50 mg total) by mouth daily.    Dispense:  90 tablet    Refill:  3     Signed, Richardson Dopp, PA-C  12/02/2020 5:46 PM    [facility] [address], [place], [place]  [address] Phone: [phone]; Fax: [phone]

## 2020-12-02 ENCOUNTER — Other Ambulatory Visit: Payer: Self-pay

## 2020-12-02 ENCOUNTER — Encounter: Payer: Self-pay | Admitting: Physician Assistant

## 2020-12-02 ENCOUNTER — Ambulatory Visit: Payer: Medicare HMO | Admitting: Physician Assistant

## 2020-12-02 ENCOUNTER — Encounter (INDEPENDENT_AMBULATORY_CARE_PROVIDER_SITE_OTHER): Payer: Self-pay

## 2020-12-02 VITALS — BP 160/80 | HR 93 | Ht <= 58 in | Wt 92.0 lb

## 2020-12-02 DIAGNOSIS — R011 Cardiac murmur, unspecified: Secondary | ICD-10-CM | POA: Diagnosis not present

## 2020-12-02 DIAGNOSIS — R002 Palpitations: Secondary | ICD-10-CM

## 2020-12-02 DIAGNOSIS — I6523 Occlusion and stenosis of bilateral carotid arteries: Secondary | ICD-10-CM

## 2020-12-02 DIAGNOSIS — E782 Mixed hyperlipidemia: Secondary | ICD-10-CM | POA: Diagnosis not present

## 2020-12-02 DIAGNOSIS — I10 Essential (primary) hypertension: Secondary | ICD-10-CM

## 2020-12-02 DIAGNOSIS — I2510 Atherosclerotic heart disease of native coronary artery without angina pectoris: Secondary | ICD-10-CM

## 2020-12-02 DIAGNOSIS — R7989 Other specified abnormal findings of blood chemistry: Secondary | ICD-10-CM

## 2020-12-02 LAB — LIPID PANEL
Chol/HDL Ratio: 4.2 ratio (ref 0.0–4.4)
Cholesterol, Total: 174 mg/dL (ref 100–199)
HDL: 41 mg/dL (ref >39)
LDL Chol Calc (NIH): 111 mg/dL — ABNORMAL HIGH (ref 0–99)
Triglycerides: 124 mg/dL (ref 0–149)
VLDL Cholesterol Cal: 22 mg/dL (ref 5–40)

## 2020-12-02 LAB — COMPREHENSIVE METABOLIC PANEL
ALT: 13 IU/L (ref 0–32)
AST: 15 IU/L (ref 0–40)
Albumin/Globulin Ratio: 1.5 (ref 1.2–2.2)
Albumin: 4.4 g/dL (ref 3.7–4.7)
Alkaline Phosphatase: 79 IU/L (ref 44–121)
BUN/Creatinine Ratio: 10 — ABNORMAL LOW (ref 12–28)
BUN: 8 mg/dL (ref 8–27)
Bilirubin Total: 0.4 mg/dL (ref 0.0–1.2)
CO2: 25 mmol/L (ref 20–29)
Calcium: 9.2 mg/dL (ref 8.7–10.3)
Chloride: 96 mmol/L (ref 96–106)
Creatinine, Ser: 0.81 mg/dL (ref 0.57–1.00)
Globulin, Total: 3 g/dL (ref 1.5–4.5)
Glucose: 107 mg/dL — ABNORMAL HIGH (ref 65–99)
Potassium: 4.4 mmol/L (ref 3.5–5.2)
Sodium: 138 mmol/L (ref 134–144)
Total Protein: 7.4 g/dL (ref 6.0–8.5)
eGFR: 77 mL/min/{1.73_m2} (ref >59)

## 2020-12-02 LAB — CBC
Hematocrit: 37.2 % (ref 34.0–46.6)
Hemoglobin: 12.3 g/dL (ref 11.1–15.9)
MCH: 30.4 pg (ref 26.6–33.0)
MCHC: 33.1 g/dL (ref 31.5–35.7)
MCV: 92 fL (ref 79–97)
Platelets: 594 10*3/uL — ABNORMAL HIGH (ref 150–450)
RBC: 4.04 x10E6/uL (ref 3.77–5.28)
RDW: 11.3 % — ABNORMAL LOW (ref 11.7–15.4)
WBC: 9.6 10*3/uL (ref 3.4–10.8)

## 2020-12-02 LAB — TSH: TSH: 1.64 u[IU]/mL (ref 0.450–4.500)

## 2020-12-02 MED ORDER — LOSARTAN POTASSIUM 50 MG PO TABS: 50.0000 mg | ORAL_TABLET | Freq: Every day | ORAL | 3 refills | Status: DC

## 2020-12-02 MED ORDER — ASPIRIN EC 81 MG PO TBEC: 81.0000 mg | DELAYED_RELEASE_TABLET | Freq: Every day | ORAL | 3 refills | Status: DC

## 2020-12-02 NOTE — Patient Instructions (Addendum)
Medication Instructions:    Your physician has recommended you make the following change in your medication:   RESTART ASPIRIN one tablet by mouth ( 81 mg) daily.   RESTART Losartan one half tablet by mouth ( 25 mg) daily.   *If you need a refill on your cardiac medications before your next appointment, please call your pharmacy*   Lab Work:  TODAY!!!! CMET/LIPID/CBC/TSH  Your physician recommends that you return for lab on day of echo June 30.  If you have labs (blood work) drawn today and your tests are completely normal, you will receive your results only  by: [place] (if you have MyChart) OR A paper copy in the mail If you have any lab test that is abnormal or we need to change your treatment, we will call you to review the results.   Testing/Procedures: Your physician has requested that you have an echocardiogram. Thursday, June 30 @ 8:30 am. Echocardiography is a painless test that uses sound waves to create images of your heart. It provides your doctor with information about the size and shape of your heart and how well your heart's chambers and valves are working. This procedure takes approximately one hour. There are no restrictions for this procedure.    Follow-Up: At [facility], you and your health needs are our priority.  As part of our continuing mission to provide you with exceptional heart care, we have created designated Provider Care Teams.  These Care Teams include your primary Cardiologist (physician) and Advanced Practice Providers (APPs -  Physician Assistants and Nurse Practitioners) who all work together to provide you with the care you need, when you need it.  We recommend signing up for the patient portal called "MyChart".  Sign up information is provided on this After Visit Summary.  MyChart is used to connect with patients for Virtual Visits (Telemedicine).  Patients are able to view lab/test results, encounter notes, upcoming appointments, etc.   Non-urgent messages can be sent to your provider as well.   To learn more about what you can do with MyChart, go to [email or web address].    Your next appointment:   6 month(s) with Richardson Dopp, PA on Friday, December 9 @ 8:15 am.   The format for your next appointment:   In Person  Provider:   Richardson Dopp, PA-C   Other Instructions You have been referred to the [facility] on Wednesday, July 13 @ 9:30 am.

## 2020-12-10 ENCOUNTER — Encounter: Payer: Self-pay | Admitting: Physician Assistant

## 2020-12-10 ENCOUNTER — Other Ambulatory Visit: Payer: Self-pay

## 2020-12-10 ENCOUNTER — Ambulatory Visit (HOSPITAL_COMMUNITY): Payer: Medicare HMO | Attending: Internal Medicine

## 2020-12-10 ENCOUNTER — Other Ambulatory Visit: Payer: Medicare HMO

## 2020-12-10 DIAGNOSIS — I2510 Atherosclerotic heart disease of native coronary artery without angina pectoris: Secondary | ICD-10-CM | POA: Insufficient documentation

## 2020-12-10 DIAGNOSIS — R002 Palpitations: Secondary | ICD-10-CM | POA: Insufficient documentation

## 2020-12-10 DIAGNOSIS — E782 Mixed hyperlipidemia: Secondary | ICD-10-CM | POA: Insufficient documentation

## 2020-12-10 DIAGNOSIS — I10 Essential (primary) hypertension: Secondary | ICD-10-CM | POA: Diagnosis not present

## 2020-12-10 DIAGNOSIS — I340 Nonrheumatic mitral (valve) insufficiency: Secondary | ICD-10-CM | POA: Insufficient documentation

## 2020-12-10 LAB — BASIC METABOLIC PANEL
BUN/Creatinine Ratio: 12 (ref 12–28)
BUN: 10 mg/dL (ref 8–27)
CO2: 26 mmol/L (ref 20–29)
Calcium: 9.3 mg/dL (ref 8.7–10.3)
Chloride: 99 mmol/L (ref 96–106)
Creatinine, Ser: 0.82 mg/dL (ref 0.57–1.00)
Glucose: 106 mg/dL — ABNORMAL HIGH (ref 65–99)
Potassium: 4.5 mmol/L (ref 3.5–5.2)
Sodium: 139 mmol/L (ref 134–144)
eGFR: 75 mL/min/{1.73_m2} (ref >59)

## 2020-12-10 LAB — ECHOCARDIOGRAM COMPLETE
AV Mean grad: 8 mmHg
Area-P 1/2: 3.74 cm2
MV M vel: 6.35 m/s
MV Peak grad: 161.3 mmHg
P 1/2 time: 374 msec
Radius: 0.5 cm
S' Lateral: 2.7 cm

## 2020-12-11 NOTE — Progress Notes (Signed)
Pt has been made aware of normal result and verbalized understanding.  jw

## 2020-12-18 DIAGNOSIS — H5203 Hypermetropia, bilateral: Secondary | ICD-10-CM | POA: Diagnosis not present

## 2020-12-18 DIAGNOSIS — Z0100 Encounter for examination of eyes and vision without abnormal findings: Secondary | ICD-10-CM | POA: Diagnosis not present

## 2020-12-23 ENCOUNTER — Ambulatory Visit: Payer: Medicare HMO

## 2021-01-27 ENCOUNTER — Telehealth: Payer: Self-pay | Admitting: Physician Assistant

## 2021-01-27 DIAGNOSIS — K648 Other hemorrhoids: Secondary | ICD-10-CM | POA: Diagnosis not present

## 2021-01-27 DIAGNOSIS — I340 Nonrheumatic mitral (valve) insufficiency: Secondary | ICD-10-CM

## 2021-01-27 NOTE — Telephone Encounter (Signed)
[name] Shi, PA-C  12/10/2020  5:58 PM EDT     Echocardiogram shows normal heart function.  There is mild to moderate leakage of the mitral valve, mild leakage of the aortic valve. PLAN:  - Continue current medications/treatment plan and follow up as scheduled.  -Repeat echo in 1 year (mitral regurgitation) Richardson Dopp, PA-C    12/10/2020 5:56 PM    The patient has been notified of the result and verbalized understanding.  All questions (if any) were answered. Pt aware I will go ahead and place one year echo in the system and send a message to our echo scheduler to call her back closer to that time, to arrange this test. Pt verbalized understanding and agrees with this plan.

## 2021-01-27 NOTE — Telephone Encounter (Signed)
   Pt is said she wants to know her echo result, she said she doesn't remember talking to someone about it before

## 2021-05-13 ENCOUNTER — Other Ambulatory Visit: Payer: Self-pay

## 2021-05-13 ENCOUNTER — Ambulatory Visit
Admission: EM | Admit: 2021-05-13 | Discharge: 2021-05-13 | Disposition: A | Discharge status: Home / Self Care | Payer: Medicare HMO

## 2021-05-13 DIAGNOSIS — M25571 Pain in right ankle and joints of right foot: Secondary | ICD-10-CM

## 2021-05-13 NOTE — ED Provider Notes (Signed)
[facility]    CSN: [id number] Arrival date & time: 05/13/21  0814      History   Chief Complaint Chief Complaint  Patient presents with   Cyst    HPI [name] is a 75 y.o. female.   Patient here today for evaluation of dark colored "knot" to the bottom of her right foot. She states she first noticed same about a week ago. She has some tenderness to touch. She has not had injury. She does not report any numbness.   The history is provided by the patient.   Past Medical History:  Diagnosis Date   Arthritis    CAD (coronary artery disease)    a. s/p NSTEMI 10/12: LHC 04/02/11: mLAD 95%, EF 60%; PCI: Promus DES to mLAD. b. LHC 04/29/11:  LAD stent patent, proximal RCA 25%, EF 65%  c. 11/2015 - NSTEMI: cath w/ 99% stenosis prox LAD (DES placed) and 90% stenosis D1 (jailed by old stent, POBA alone performed), pRCA 40, EF > 65%   Carotid stenosis 2015   a. Carotid US 8/15: 40-59% bilateral ICA stenosis  //   b. Carotid US 7/17: Stable 40-59% bilateral ICA stenosis. FU 1 year   GERD (gastroesophageal reflux disease)    HBV (hepatitis B virus) infection    Hiatal hernia    History of echocardiogram    a. Echo 6/17:  EF 60-65%, mild anterior HK, grade 1 diastolic dysfunction, mild TR, PASP 23 mmHg   History of noncompliance with medical treatment    HLD (hyperlipidemia)    HTN (hypertension)    a. RA duplex 11/15: norma caliber aorta, norma RA bilaterally    Lung nodule    a. Pt initially refused further workup. b. Per pulm note, resolved 02/19/14 and never smoker so no further f/u needed.   Mitral regurgitation    Echo 6/22: EF 65-70, no RWMA, normal RVSF, RVSP 26.8, mild-moderate MR, mild AI, AV sclerosis without stenosis   Multinodular thyroid    PVC's (premature ventricular contractions)     Patient Active Problem List   Diagnosis Date Noted   Mitral regurgitation 12/10/2020   Head injury    Syncope    Status post coronary artery stent placement     History of non-ST elevation myocardial infarction (NSTEMI) 11/28/2015   PVC's (premature ventricular contractions)    Bilateral carotid artery disease ([place])    Upper airway cough syndrome 02/19/2014   Lung nodule, solitary 06/01/2013   Elevated LFTs 09/11/2011   CAD (coronary artery disease)    Chest pain 09/15/2010   Hyperlipidemia 09/15/2010   Hypertension 09/15/2010    Past Surgical History:  Procedure Laterality Date   BIOPSY THYROID     "negative"   CARDIAC CATHETERIZATION N/A 11/30/2015   Procedure: Left Heart Cath and Coronary Angiography;  Surgeon: Burnell Blanks, MD;  Location: [facility];  Service: Cardiovascular;  Laterality: N/A;   [place] WITH STENT PLACEMENT  2012; 11/30/2015   EXCISIONAL HEMORRHOIDECTOMY  1999   LIPOMA EXCISION Right ~ 2015   "popliteal"   TONSILLECTOMY      OB History   No obstetric history on file.      Home Medications    Prior to Admission medications   Not on File    Family History Family History  Problem Relation Age of Onset   Prostate cancer Father    Cancer Father 74       bladder  cancer   Alcohol abuse Father    Diabetes Sister    Diabetes Sister    Heart attack Brother    Stroke Neg Hx     Social History Social History   Tobacco Use   Smoking status: Never   Smokeless tobacco: Never  Vaping Use   Vaping Use: Never used  Substance Use Topics   Alcohol use: No   Drug use: No     Allergies   Atorvastatin, Crestor [rosuvastatin], Pravastatin sodium, and Ibuprofen   Review of Systems Review of Systems  Constitutional:  Negative for chills and fever.  Eyes:  Negative for discharge and redness.  Respiratory:  Negative for shortness of breath.   Gastrointestinal:  Negative for abdominal pain, nausea and vomiting.  Genitourinary:  Positive for vaginal bleeding and vaginal discharge.  Skin:  Negative for color change and wound.    Physical Exam Triage  Vital Signs ED Triage Vitals  Enc Vitals Group     BP 05/13/21 0824 (!) 172/75     Pulse Rate 05/13/21 0824 89     Resp 05/13/21 0824 18     Temp 05/13/21 0824 97.7 F (36.5 C)     Temp Source 05/13/21 0824 Oral     SpO2 05/13/21 0824 98 %     Weight --      Height --      Head Circumference --      Peak Flow --      Pain Score 05/13/21 0825 2     Pain Loc --      Pain Edu? --      Excl. in [place]? --    No data found.  Updated Vital Signs BP (!) 172/75 (BP Location: Left Arm)   Pulse 89   Temp 97.7 F (36.5 C) (Oral)   Resp 18   SpO2 98%      Physical Exam Vitals and nursing note reviewed.  Constitutional:      General: She is not in acute distress.    Appearance: Normal appearance. She is not ill-appearing.  HENT:     Head: Normocephalic and atraumatic.  Eyes:     Conjunctiva/sclera: Conjunctivae normal.  Cardiovascular:     Rate and Rhythm: Normal rate.  Pulmonary:     Effort: Pulmonary effort is normal.  Musculoskeletal:     Comments: ~ 1 cm area of purple colored swelling to sole of right distal foot around base of 1st MTP- area is TTP, no wound appreciated, no surrounding erythema  Neurological:     Mental Status: She is alert.  Psychiatric:        Mood and Affect: Mood normal.        Behavior: Behavior normal.        Thought Content: Thought content normal.     UC Treatments / Results  Labs (all labs ordered are listed, but only abnormal results are displayed) Labs Reviewed - No data to display  EKG   Radiology No results found.  Procedures Procedures (including critical care time)  Medications Ordered in UC Medications - No data to display  Initial Impression / Assessment and Plan / UC Course  I have reviewed the triage vital signs and the nursing notes.  Pertinent labs & imaging results that were available during my care of the patient were reviewed by me and considered in my medical decision making (see chart for details).    Unknown  etiology of symptoms. Recommended further evaluation by podiatry but patient  reports that she does not want to see podiatry due to issues with insurance in the past. She would like to be seen by general surgery instead. She asks for a contact to same. Contact provided.   Final Clinical Impressions(s) / UC Diagnoses   Final diagnoses:  Pain in joint involving right ankle and foot   Discharge Instructions   None    ED Prescriptions   None    PDMP not reviewed this encounter.   Francene Finders, PA-C 05/13/21 [phone]

## 2021-05-13 NOTE — ED Triage Notes (Signed)
Pt c/o a dark colored hard knot to bottom of rt foot x1 wk. Tender to touch. Denies injury.

## 2021-05-21 ENCOUNTER — Ambulatory Visit: Payer: Medicare HMO | Admitting: Physician Assistant

## 2021-06-23 DIAGNOSIS — Z711 Person with feared health complaint in whom no diagnosis is made: Secondary | ICD-10-CM | POA: Diagnosis not present

## 2021-12-10 ENCOUNTER — Encounter (HOSPITAL_COMMUNITY): Payer: Self-pay | Admitting: Physician Assistant

## 2022-01-04 ENCOUNTER — Telehealth (HOSPITAL_COMMUNITY): Payer: Self-pay | Admitting: Physician Assistant

## 2022-01-04 NOTE — Telephone Encounter (Signed)
Just an FYI. We have made several attempts to contact this patient including sending a letter to schedule or reschedule their echocardiogram. We will be removing the patient from the echo [place].   12/10/21 MAILED LETTER [facility]  12/10/21 Called and Number cut me off @ 12:38 [facility]      Thank you

## 2022-02-09 ENCOUNTER — Other Ambulatory Visit: Payer: Self-pay | Admitting: *Deleted

## 2022-02-09 DIAGNOSIS — R011 Cardiac murmur, unspecified: Secondary | ICD-10-CM

## 2022-02-23 ENCOUNTER — Ambulatory Visit (HOSPITAL_COMMUNITY): Payer: Medicare HMO | Attending: Physician Assistant

## 2022-02-23 ENCOUNTER — Encounter: Payer: Self-pay | Admitting: Physician Assistant

## 2022-02-23 DIAGNOSIS — R011 Cardiac murmur, unspecified: Secondary | ICD-10-CM | POA: Diagnosis not present

## 2022-02-23 DIAGNOSIS — I351 Nonrheumatic aortic (valve) insufficiency: Secondary | ICD-10-CM | POA: Insufficient documentation

## 2022-02-23 HISTORY — DX: Nonrheumatic aortic (valve) insufficiency: I35.1

## 2022-02-23 LAB — ECHOCARDIOGRAM COMPLETE
Area-P 1/2: 2.24 cm2
MV M vel: 5.65 m/s
MV Peak grad: 127.7 mmHg
P 1/2 time: 387 msec
Radius: 0.55 cm
S' Lateral: 2.3 cm

## 2022-10-10 DIAGNOSIS — K1321 Leukoplakia of oral mucosa, including tongue: Secondary | ICD-10-CM | POA: Diagnosis not present

## 2022-10-10 DIAGNOSIS — R600 Localized edema: Secondary | ICD-10-CM | POA: Diagnosis not present

## 2022-10-10 DIAGNOSIS — H9313 Tinnitus, bilateral: Secondary | ICD-10-CM | POA: Diagnosis not present

## 2022-11-30 DIAGNOSIS — M25562 Pain in left knee: Secondary | ICD-10-CM | POA: Diagnosis not present

## 2022-11-30 DIAGNOSIS — M25561 Pain in right knee: Secondary | ICD-10-CM | POA: Diagnosis not present

## 2023-06-16 DIAGNOSIS — K1321 Leukoplakia of oral mucosa, including tongue: Secondary | ICD-10-CM | POA: Diagnosis not present

## 2023-06-16 DIAGNOSIS — R600 Localized edema: Secondary | ICD-10-CM | POA: Diagnosis not present

## 2023-06-16 DIAGNOSIS — I10 Essential (primary) hypertension: Secondary | ICD-10-CM | POA: Diagnosis not present
